# Patient Record
Sex: Male | Born: 1945 | Race: White | Hispanic: No | Smoking: Former smoker
Health system: Southern US, Community
[De-identification: ages and names within clinical notes are randomized; demographics above are authoritative.]

## PROBLEM LIST (undated history)

## (undated) DIAGNOSIS — G4730 Sleep apnea, unspecified: Secondary | ICD-10-CM

## (undated) DIAGNOSIS — J45909 Unspecified asthma, uncomplicated: Secondary | ICD-10-CM

## (undated) DIAGNOSIS — M2550 Pain in unspecified joint: Secondary | ICD-10-CM

## (undated) DIAGNOSIS — E785 Hyperlipidemia, unspecified: Secondary | ICD-10-CM

## (undated) DIAGNOSIS — G4700 Insomnia, unspecified: Secondary | ICD-10-CM

## (undated) DIAGNOSIS — M1990 Unspecified osteoarthritis, unspecified site: Secondary | ICD-10-CM

## (undated) DIAGNOSIS — M109 Gout, unspecified: Secondary | ICD-10-CM

## (undated) DIAGNOSIS — M549 Dorsalgia, unspecified: Secondary | ICD-10-CM

## (undated) DIAGNOSIS — I10 Essential (primary) hypertension: Secondary | ICD-10-CM

## (undated) DIAGNOSIS — Z8489 Family history of other specified conditions: Secondary | ICD-10-CM

## (undated) DIAGNOSIS — G629 Polyneuropathy, unspecified: Secondary | ICD-10-CM

## (undated) DIAGNOSIS — E039 Hypothyroidism, unspecified: Secondary | ICD-10-CM

## (undated) DIAGNOSIS — E079 Disorder of thyroid, unspecified: Secondary | ICD-10-CM

## (undated) HISTORY — DX: Insomnia, unspecified: G47.00

## (undated) HISTORY — DX: Gout, unspecified: M10.9

## (undated) HISTORY — DX: Hyperlipidemia, unspecified: E78.5

## (undated) HISTORY — DX: Unspecified asthma, uncomplicated: J45.909

## (undated) HISTORY — DX: Essential (primary) hypertension: I10

## (undated) HISTORY — DX: Unspecified osteoarthritis, unspecified site: M19.90

## (undated) HISTORY — DX: Disorder of thyroid, unspecified: E07.9

## (undated) HISTORY — PX: OTHER SURGICAL HISTORY: SHX169

## (undated) HISTORY — DX: Pain in unspecified joint: M25.50

## (undated) HISTORY — DX: Dorsalgia, unspecified: M54.9

---

## 2005-07-28 ENCOUNTER — Ambulatory Visit: Payer: Self-pay | Admitting: Cardiology

## 2005-08-02 ENCOUNTER — Ambulatory Visit: Payer: Self-pay | Admitting: Cardiology

## 2012-05-08 ENCOUNTER — Other Ambulatory Visit: Payer: Self-pay | Admitting: *Deleted

## 2013-06-05 ENCOUNTER — Encounter (INDEPENDENT_AMBULATORY_CARE_PROVIDER_SITE_OTHER): Payer: Self-pay

## 2013-06-05 ENCOUNTER — Ambulatory Visit (INDEPENDENT_AMBULATORY_CARE_PROVIDER_SITE_OTHER): Payer: Medicare Other | Admitting: Family Medicine

## 2013-06-05 ENCOUNTER — Encounter: Payer: Self-pay | Admitting: Family Medicine

## 2013-06-05 VITALS — BP 171/99 | HR 83 | Temp 97.5°F | Ht 71.0 in | Wt 223.0 lb

## 2013-06-05 DIAGNOSIS — G4700 Insomnia, unspecified: Secondary | ICD-10-CM | POA: Diagnosis not present

## 2013-06-05 DIAGNOSIS — I10 Essential (primary) hypertension: Secondary | ICD-10-CM | POA: Diagnosis not present

## 2013-06-05 DIAGNOSIS — E785 Hyperlipidemia, unspecified: Secondary | ICD-10-CM

## 2013-06-05 DIAGNOSIS — R350 Frequency of micturition: Secondary | ICD-10-CM | POA: Diagnosis not present

## 2013-06-05 DIAGNOSIS — M25569 Pain in unspecified knee: Secondary | ICD-10-CM

## 2013-06-05 DIAGNOSIS — G2581 Restless legs syndrome: Secondary | ICD-10-CM

## 2013-06-05 DIAGNOSIS — M25562 Pain in left knee: Secondary | ICD-10-CM

## 2013-06-05 LAB — POCT CBC
Granulocyte percent: 84 %G — AB (ref 37–80)
HCT, POC: 46.9 % (ref 43.5–53.7)
Hemoglobin: 16.2 g/dL (ref 14.1–18.1)
Lymph, poc: 1.7 (ref 0.6–3.4)
MCH, POC: 32.6 pg — AB (ref 27–31.2)
MCHC: 34.5 g/dL (ref 31.8–35.4)
MCV: 94.4 fL (ref 80–97)
MPV: 8 fL (ref 0–99.8)
POC Granulocyte: 9.1 — AB (ref 2–6.9)
POC LYMPH PERCENT: 15.3 %L (ref 10–50)
Platelet Count, POC: 264 10*3/uL (ref 142–424)
RBC: 5 M/uL (ref 4.69–6.13)
RDW, POC: 12.9 %
WBC: 10.8 10*3/uL — AB (ref 4.6–10.2)

## 2013-06-05 MED ORDER — CARBIDOPA-LEVODOPA ER 50-200 MG PO TBCR: EXTENDED_RELEASE_TABLET | ORAL | Status: DC

## 2013-06-05 MED ORDER — LISINOPRIL 10 MG PO TABS: 10.0000 mg | ORAL_TABLET | Freq: Every day | ORAL | Status: DC

## 2013-06-05 MED ORDER — TRAZODONE HCL 50 MG PO TABS: ORAL_TABLET | ORAL | Status: DC

## 2013-06-05 NOTE — Addendum Note (Signed)
Addended by: Bernita Buffy on: 06/05/2013 11:38 AM   Modules accepted: Orders

## 2013-06-05 NOTE — Addendum Note (Signed)
Addended by: Bernita Buffy on: 06/05/2013 11:36 AM   Modules accepted: Orders

## 2013-06-05 NOTE — Progress Notes (Signed)
   Subjective:    Patient ID: Andrew Phelps, male    DOB: 01-29-1946, 67 y.o.   MRN: [id number]  HPI  This 67 y.o. male presents for evaluation of CPE and he has hx of  hypertension, RLS, insomnia.  He has not seen a PCP in some time.  Review of Systems No chest pain, SOB, HA, dizziness, vision change, N/V, diarrhea, constipation, dysuria, urinary urgency or frequency, myalgias, arthralgias or rash.     Objective:   Physical Exam Vital signs noted  Well developed well nourished male.  HEENT - Head atraumatic Normocephalic                Eyes - PERRLA, Conjuctiva - clear Sclera- Clear EOMI                Ears - EAC's Wnl TM's Wnl Gross Hearing WNL                Nose - Nares patent                 Throat - oropharanx wnl Respiratory - Lungs CTA bilateral Cardiac - RRR S1 and S2 without murmur GI - Abdomen soft Nontender and bowel sounds active x 4 Extremities - No edema. Neuro - Grossly intact.       Assessment & Plan:  Insomnia - Plan: traZODone (DESYREL) 50 MG tablet, one to two po qhs prn insomnia  RLS (restless legs syndrome) - Plan: carbidopa-levodopa (SINEMET CR) 50-200 MG per tablet  Urinary frequency - Plan: PSA.  Other and unspecified hyperlipidemia - Plan: POCT CBC, CMP14+EGFR, Lipid panel, Thyroid Panel With TSH  Essential hypertension, benign - Plan: POCT CBC, CMP14+EGFR, lisinopril (PRINIVIL,ZESTRIL) 10 MG tablet.  Follow up in 2 weeks.  Deatra Canter FNP

## 2013-06-05 NOTE — Patient Instructions (Signed)

## 2013-06-06 ENCOUNTER — Other Ambulatory Visit: Payer: Self-pay | Admitting: Family Medicine

## 2013-06-06 LAB — CMP14+EGFR
ALT: 16 IU/L (ref 0–44)
AST: 20 IU/L (ref 0–40)
Albumin/Globulin Ratio: 1.3 (ref 1.1–2.5)
Albumin: 4.2 g/dL (ref 3.6–4.8)
Alkaline Phosphatase: 94 IU/L (ref 39–117)
BUN/Creatinine Ratio: 7 — ABNORMAL LOW (ref 10–22)
BUN: 9 mg/dL (ref 8–27)
CO2: 21 mmol/L (ref 18–29)
Calcium: 9.4 mg/dL (ref 8.6–10.2)
Chloride: 97 mmol/L (ref 97–108)
Creatinine, Ser: 1.28 mg/dL — ABNORMAL HIGH (ref 0.76–1.27)
GFR calc Af Amer: 67 mL/min/{1.73_m2} (ref >59)
GFR calc non Af Amer: 58 mL/min/{1.73_m2} — ABNORMAL LOW (ref >59)
Globulin, Total: 3.3 g/dL (ref 1.5–4.5)
Glucose: 93 mg/dL (ref 65–99)
Potassium: 5 mmol/L (ref 3.5–5.2)
Sodium: 137 mmol/L (ref 134–144)
Total Bilirubin: 1.6 mg/dL — ABNORMAL HIGH (ref 0.0–1.2)
Total Protein: 7.5 g/dL (ref 6.0–8.5)

## 2013-06-06 LAB — PSA, TOTAL AND FREE
PSA, Free Pct: 23.3 %
PSA, Free: 0.14 ng/mL
PSA: 0.6 ng/mL (ref 0.0–4.0)

## 2013-06-06 LAB — LIPID PANEL
Chol/HDL Ratio: 3.4 ratio units (ref 0.0–5.0)
Cholesterol, Total: 196 mg/dL (ref 100–199)
HDL: 57 mg/dL (ref >39)
LDL Calculated: 122 mg/dL — ABNORMAL HIGH (ref 0–99)
Triglycerides: 83 mg/dL (ref 0–149)
VLDL Cholesterol Cal: 17 mg/dL (ref 5–40)

## 2013-06-06 LAB — THYROID PANEL WITH TSH
Free Thyroxine Index: 1.3 (ref 1.2–4.9)
T3 Uptake Ratio: 28 % (ref 24–39)
T4, Total: 4.7 ug/dL (ref 4.5–12.0)
TSH: 16.72 u[IU]/mL — ABNORMAL HIGH (ref 0.450–4.500)

## 2013-06-06 MED ORDER — LEVOTHYROXINE SODIUM 50 MCG PO TABS: 50.0000 ug | ORAL_TABLET | Freq: Every day | ORAL | Status: DC

## 2013-06-06 MED ORDER — CIPROFLOXACIN HCL 500 MG PO TABS: 500.0000 mg | ORAL_TABLET | Freq: Two times a day (BID) | ORAL | Status: DC

## 2013-06-19 ENCOUNTER — Telehealth: Payer: Self-pay | Admitting: Family Medicine

## 2013-06-19 NOTE — Telephone Encounter (Signed)
Message copied by Azalee Course on Tue Jun 19, 2013 11:42 AM ------      Message from: Deatra Canter      Created: Wed Jun 06, 2013  4:22 PM       Wbc count is elevated and want him to take cipro for 2 weeks for ua frequency and possible prostatitis.  TSH is elevated and rx sent in for levothyroxine 50 mcg po qd and need to follow up in 6 weeks for repeat TSH ------

## 2013-07-05 ENCOUNTER — Ambulatory Visit (INDEPENDENT_AMBULATORY_CARE_PROVIDER_SITE_OTHER): Payer: Medicare Other | Admitting: Family Medicine

## 2013-07-05 ENCOUNTER — Encounter: Payer: Self-pay | Admitting: Family Medicine

## 2013-07-05 VITALS — BP 120/71 | HR 82 | Temp 98.6°F | Ht 71.0 in | Wt 219.0 lb

## 2013-07-05 DIAGNOSIS — Z23 Encounter for immunization: Secondary | ICD-10-CM | POA: Diagnosis not present

## 2013-07-05 DIAGNOSIS — G2581 Restless legs syndrome: Secondary | ICD-10-CM | POA: Diagnosis not present

## 2013-07-05 DIAGNOSIS — J309 Allergic rhinitis, unspecified: Secondary | ICD-10-CM | POA: Diagnosis not present

## 2013-07-05 DIAGNOSIS — E039 Hypothyroidism, unspecified: Secondary | ICD-10-CM | POA: Diagnosis not present

## 2013-07-05 DIAGNOSIS — J302 Other seasonal allergic rhinitis: Secondary | ICD-10-CM

## 2013-07-05 DIAGNOSIS — I10 Essential (primary) hypertension: Secondary | ICD-10-CM

## 2013-07-05 MED ORDER — LEVOTHYROXINE SODIUM 50 MCG PO TABS: 50.0000 ug | ORAL_TABLET | Freq: Every day | ORAL | Status: DC

## 2013-07-05 MED ORDER — FLUTICASONE PROPIONATE 50 MCG/ACT NA SUSP: 2.0000 | Freq: Every day | NASAL | Status: DC

## 2013-07-05 MED ORDER — CARBIDOPA-LEVODOPA ER 50-200 MG PO TBCR: EXTENDED_RELEASE_TABLET | ORAL | Status: DC

## 2013-07-05 NOTE — Progress Notes (Signed)
   Subjective:    Patient ID: Andrew Phelps, male    DOB: 21-Oct-1945, 68 y.o.   MRN: [id number]  HPI This 68 y.o. male presents for evaluation of follow up. He has HTN and was started on lisinopril. He has RLS and was started on sinemet and this is working well.  He has hypothyroidism and has not started his levothyroxine.  He has been sleeping better on the trazadone.  He is having allergy problems.   Review of Systems No chest pain, SOB, HA, dizziness, vision change, N/V, diarrhea, constipation, dysuria, urinary urgency or frequency, myalgias, arthralgias or rash.     Objective:   Physical Exam Vital signs noted  Well developed well nourished male.  HEENT - Head atraumatic Normocephalic                Eyes - PERRLA, Conjuctiva - clear Sclera- Clear EOMI                Ears - EAC's Wnl TM's Wnl Gross Hearing WNL Respiratory - Lungs CTA bilateral Cardiac - RRR S1 and S2 without murmur GI - Abdomen soft Nontender and bowel sounds active x 4        Assessment & Plan:  Need for prophylactic vaccination against Streptococcus pneumoniae (pneumococcus) - Plan: Pneumococcal conjugate vaccine 13-valent  Need for prophylactic vaccination and inoculation against influenza  RLS (restless legs syndrome) - Plan: carbidopa-levodopa (SINEMET CR) 50-200 MG per tablet  Essential hypertension, benign - Continue lisinopril  Unspecified hypothyroidism - Plan: levothyroxine (SYNTHROID, LEVOTHROID) 50 MCG tablet  Seasonal allergies - Plan: fluticasone (FLONASE) 50 MCG/ACT nasal spray  Follow up in 3 months and recheck Thyroid panel  Lysbeth Penner FNP

## 2013-07-05 NOTE — Patient Instructions (Signed)
Influenza Virus Vaccine injection What is this medicine? INFLUENZA VIRUS VACCINE (in floo EN zuh VAHY ruhs vak SEEN) helps to reduce the risk of getting influenza also known as the flu. The vaccine only helps protect you against some strains of the flu. This medicine may be used for other purposes; ask your health care provider or pharmacist if you have questions. COMMON BRAND NAME(S): Afluria , Agriflu, Fluarix Quadrivalent, Fluarix, FLUCELVAX, Flulaval, Fluvirin, Fluzone High-Dose, Fluzone Intradermal, Fluzone What should I tell my health care provider before I take this medicine? They need to know if you have any of these conditions: -bleeding disorder like hemophilia -fever or infection -Guillain-Barre syndrome or other neurological problems -immune system problems -infection with the human immunodeficiency virus (HIV) or AIDS -low blood platelet counts -multiple sclerosis -an unusual or allergic reaction to influenza virus vaccine, latex, other medicines, foods, dyes, or preservatives. Different brands of vaccines contain different allergens. Some may contain latex or eggs. Talk to your doctor about your allergies to make sure that you get the right vaccine. -pregnant or trying to get pregnant -breast-feeding How should I use this medicine? This vaccine is for injection into a muscle or under the skin. It is given by a health care professional. A copy of Vaccine Information Statements will be given before each vaccination. Read this sheet carefully each time. The sheet may change frequently. Talk to your healthcare provider to see which vaccines are right for you. Some vaccines should not be used in all age groups. Overdosage: If you think you have taken too much of this medicine contact a poison control center or emergency room at once. NOTE: This medicine is only for you. Do not share this medicine with others. What if I miss a dose? This does not apply. What may interact with this  medicine? -chemotherapy or radiation therapy -medicines that lower your immune system like etanercept, anakinra, infliximab, and adalimumab -medicines that treat or prevent blood clots like warfarin -phenytoin -steroid medicines like prednisone or cortisone -theophylline -vaccines This list may not describe all possible interactions. Give your health care provider a list of all the medicines, herbs, non-prescription drugs, or dietary supplements you use. Also tell them if you smoke, drink alcohol, or use illegal drugs. Some items may interact with your medicine. What should I watch for while using this medicine? Report any side effects that do not go away within 3 days to your doctor or health care professional. Call your health care provider if any unusual symptoms occur within 6 weeks of receiving this vaccine. You may still catch the flu, but the illness is not usually as bad. You cannot get the flu from the vaccine. The vaccine will not protect against colds or other illnesses that may cause fever. The vaccine is needed every year. What side effects may I notice from receiving this medicine? Side effects that you should report to your doctor or health care professional as soon as possible: -allergic reactions like skin rash, itching or hives, swelling of the face, lips, or tongue Side effects that usually do not require medical attention (report to your doctor or health care professional if they continue or are bothersome): -fever -headache -muscle aches and pains -pain, tenderness, redness, or swelling at the injection site -tiredness This list may not describe all possible side effects. Call your doctor for medical advice about side effects. You may report side effects to FDA at 1-800-FDA-1088. Where should I keep my medicine? The vaccine will be given by a  health care professional in a clinic, pharmacy, doctor's office, or other health care setting. You will not be given vaccine doses  to store at home. NOTE: This sheet is a summary. It may not cover all possible information. If you have questions about this medicine, talk to your doctor, pharmacist, or health care provider.  2014, Elsevier/Gold Standard. (2011-12-16 13:08:28)  Pneumococcal Vaccine, Polyvalent suspension for injection What is this medicine? PNEUMOCOCCAL VACCINE, POLYVALENT (NEU mo KOK al vak SEEN, pol ee VEY luhnt) is a vaccine to prevent pneumococcus bacteria infection. These bacteria are a major cause of ear infections, 'Strep throat' infections, and serious pneumonia, meningitis, or blood infections worldwide. These vaccines help the body to produce antibodies (protective substances) that help your body defend against these bacteria. This vaccine is recommended for infants and young children. This vaccine will not treat an infection. This medicine may be used for other purposes; ask your health care provider or pharmacist if you have questions. COMMON BRAND NAME(S): Prevnar 13 , Prevnar What should I tell my health care provider before I take this medicine? They need to know if you have any of these conditions: -bleeding problems -fever -immune system problems -low platelet count in the blood -seizures -an unusual or allergic reaction to pneumococcal vaccine, diphtheria toxoid, other vaccines, latex, other medicines, foods, dyes, or preservatives -pregnant or trying to get pregnant -breast-feeding How should I use this medicine? This vaccine is for injection into a muscle. It is given by a health care professional. A copy of Vaccine Information Statements will be given before each vaccination. Read this sheet carefully each time. The sheet may change frequently. Talk to your pediatrician regarding the use of this medicine in children. While this drug may be prescribed for children as young as 19 weeks old for selected conditions, precautions do apply. Overdosage: If you think you have taken too much of  this medicine contact a poison control center or emergency room at once. NOTE: This medicine is only for you. Do not share this medicine with others. What if I miss a dose? It is important not to miss your dose. Call your doctor or health care professional if you are unable to keep an appointment. What may interact with this medicine? -medicines for cancer chemotherapy -medicines that suppress your immune function -medicines that treat or prevent blood clots like warfarin, enoxaparin, and dalteparin -steroid medicines like prednisone or cortisone This list may not describe all possible interactions. Give your health care provider a list of all the medicines, herbs, non-prescription drugs, or dietary supplements you use. Also tell them if you smoke, drink alcohol, or use illegal drugs. Some items may interact with your medicine. What should I watch for while using this medicine? Mild fever and pain should go away in 3 days or less. Report any unusual symptoms to your doctor or health care professional. What side effects may I notice from receiving this medicine? Side effects that you should report to your doctor or health care professional as soon as possible: -allergic reactions like skin rash, itching or hives, swelling of the face, lips, or tongue -breathing problems -confused -fever over 102 degrees F -pain, tingling, numbness in the hands or feet -seizures -unusual bleeding or bruising -unusual muscle weakness Side effects that usually do not require medical attention (report to your doctor or health care professional if they continue or are bothersome): -aches and pains -diarrhea -fever of 102 degrees F or less -headache -irritable -loss of appetite -pain, tender at site  where injected -trouble sleeping This list may not describe all possible side effects. Call your doctor for medical advice about side effects. You may report side effects to FDA at 1-800-FDA-1088. Where should I  keep my medicine? This does not apply. This vaccine is given in a clinic, pharmacy, doctor's office, or other health care setting and will not be stored at home. NOTE: This sheet is a summary. It may not cover all possible information. If you have questions about this medicine, talk to your doctor, pharmacist, or health care provider.  2014, Elsevier/Gold Standard. (2008-08-20 10:17:22)

## 2013-10-11 ENCOUNTER — Encounter: Payer: Self-pay | Admitting: Family Medicine

## 2013-10-11 ENCOUNTER — Ambulatory Visit (INDEPENDENT_AMBULATORY_CARE_PROVIDER_SITE_OTHER): Payer: Medicare Other | Admitting: Family Medicine

## 2013-10-11 VITALS — BP 140/79 | HR 78 | Temp 96.7°F | Ht 71.0 in | Wt 217.4 lb

## 2013-10-11 DIAGNOSIS — E039 Hypothyroidism, unspecified: Secondary | ICD-10-CM

## 2013-10-11 DIAGNOSIS — G4700 Insomnia, unspecified: Secondary | ICD-10-CM | POA: Diagnosis not present

## 2013-10-11 DIAGNOSIS — Z23 Encounter for immunization: Secondary | ICD-10-CM

## 2013-10-11 MED ORDER — LEVOTHYROXINE SODIUM 50 MCG PO TABS: 50.0000 ug | ORAL_TABLET | Freq: Every day | ORAL | Status: DC

## 2013-10-11 NOTE — Progress Notes (Signed)
   Subjective:    Patient ID: Andrew Phelps, male    DOB: 1946-06-19, 68 y.o.   MRN: [id number]  HPI This 68 y.o. male presents for evaluation of RLS which he states is doing a lot better.  He has hypothyroidism but did not get his levothyroxine, he has hx of insomnia, seasonal allergies, and hypertension.   Review of Systems No chest pain, SOB, HA, dizziness, vision change, N/V, diarrhea, constipation, dysuria, urinary urgency or frequency, myalgias, arthralgias or rash.     Objective:   Physical Exam Vital signs noted  Well developed well nourished male.  HEENT - Head atraumatic Normocephalic                Eyes - PERRLA, Conjuctiva - clear Sclera- Clear EOMI                Ears - EAC's Wnl TM's Wnl Gross Hearing WNL                 Throat - oropharanx wnl Respiratory - Lungs CTA bilateral Cardiac - RRR S1 and S2 without murmur GI - Abdomen soft Nontender and bowel sounds active x 4 Extremities - No edema. Neuro - Grossly intact.       Assessment & Plan:  Need for vaccination - Plan: Tdap vaccine greater than or equal to 7yo IM  Unspecified hypothyroidism - Plan: levothyroxine (SYNTHROID, LEVOTHROID) 50 MCG tablet  Insomnia - Continue trazadone  HTN - Continue lisinopril  Follow up in 3 months  Lysbeth Penner FNP

## 2013-11-02 ENCOUNTER — Other Ambulatory Visit: Payer: Self-pay | Admitting: Family Medicine

## 2013-11-05 ENCOUNTER — Other Ambulatory Visit: Payer: Self-pay | Admitting: Family Medicine

## 2013-11-23 ENCOUNTER — Telehealth: Payer: Self-pay

## 2013-11-23 NOTE — Telephone Encounter (Signed)
Opened in error

## 2014-01-10 ENCOUNTER — Ambulatory Visit (INDEPENDENT_AMBULATORY_CARE_PROVIDER_SITE_OTHER): Payer: Medicare Other | Admitting: Family Medicine

## 2014-01-10 ENCOUNTER — Encounter: Payer: Self-pay | Admitting: Family Medicine

## 2014-01-10 VITALS — BP 147/79 | HR 87 | Temp 97.0°F | Ht 71.0 in | Wt 216.2 lb

## 2014-01-10 DIAGNOSIS — J309 Allergic rhinitis, unspecified: Secondary | ICD-10-CM

## 2014-01-10 DIAGNOSIS — J302 Other seasonal allergic rhinitis: Secondary | ICD-10-CM

## 2014-01-10 DIAGNOSIS — M545 Low back pain, unspecified: Secondary | ICD-10-CM | POA: Diagnosis not present

## 2014-01-10 DIAGNOSIS — I10 Essential (primary) hypertension: Secondary | ICD-10-CM | POA: Diagnosis not present

## 2014-01-10 DIAGNOSIS — E039 Hypothyroidism, unspecified: Secondary | ICD-10-CM | POA: Diagnosis not present

## 2014-01-10 DIAGNOSIS — G2581 Restless legs syndrome: Secondary | ICD-10-CM

## 2014-01-10 DIAGNOSIS — J069 Acute upper respiratory infection, unspecified: Secondary | ICD-10-CM

## 2014-01-10 MED ORDER — LISINOPRIL 10 MG PO TABS: 10.0000 mg | ORAL_TABLET | Freq: Every day | ORAL | Status: DC

## 2014-01-10 MED ORDER — HYDROCODONE-ACETAMINOPHEN 5-325 MG PO TABS: 1.0000 | ORAL_TABLET | Freq: Four times a day (QID) | ORAL | Status: DC | PRN

## 2014-01-10 MED ORDER — CARBIDOPA-LEVODOPA ER 50-200 MG PO TBCR: EXTENDED_RELEASE_TABLET | ORAL | Status: DC

## 2014-01-10 MED ORDER — LEVOTHYROXINE SODIUM 50 MCG PO TABS: 50.0000 ug | ORAL_TABLET | Freq: Every day | ORAL | Status: DC

## 2014-01-10 MED ORDER — FLUTICASONE PROPIONATE 50 MCG/ACT NA SUSP: 2.0000 | Freq: Every day | NASAL | Status: DC

## 2014-01-10 MED ORDER — MONTELUKAST SODIUM 10 MG PO TABS: 10.0000 mg | ORAL_TABLET | Freq: Every day | ORAL | Status: DC

## 2014-01-10 MED ORDER — AZITHROMYCIN 250 MG PO TABS: ORAL_TABLET | ORAL | Status: DC

## 2014-01-10 NOTE — Progress Notes (Signed)
   Subjective:    Patient ID: Andrew Phelps, male    DOB: 21-Feb-1946, 68 y.o.   MRN: [id number]  HPI This 68 y.o. male presents for evaluation of routine follow up.  He has allergies and they have been bad recently.  He is having sore throat and uri sx's.  He has hx of RLS, SAR, insomnia, htn, and hypothyroidism.  He is due for labs .   Review of Systems C/o uri sx's   No chest pain, SOB, HA, dizziness, vision change, N/V, diarrhea, constipation, dysuria, urinary urgency or frequency, myalgias, arthralgias or rash.  Objective:   Physical Exam  Vital signs noted  Well developed well nourished male.  HEENT - Head atraumatic Normocephalic                Eyes - PERRLA, Conjuctiva - clear Sclera- Clear EOMI                Ears - EAC's Wnl TM's Wnl Gross Hearing WNL                Nose - Nares patent                 Throat - oropharanx wnl Respiratory - Lungs CTA bilateral Cardiac - RRR S1 and S2 without murmur GI - Abdomen soft Nontender and bowel sounds active x 4 Extremities - No edema. Neuro - Grossly intact. MS - TTP LS paraspinous muscles     Assessment & Plan:  Unspecified hypothyroidism - Plan: Thyroid Panel With TSH, levothyroxine (SYNTHROID, LEVOTHROID) 50 MCG tablet, DISCONTINUED: levothyroxine (SYNTHROID, LEVOTHROID) 50 MCG tablet  Essential hypertension, benign - Plan: POCT CBC, CMP14+EGFR, lisinopril (PRINIVIL,ZESTRIL) 10 MG tablet, DISCONTINUED: lisinopril (PRINIVIL,ZESTRIL) 10 MG tablet  Midline low back pain without sciatica - Plan: HYDROcodone-acetaminophen (NORCO) 5-325 MG per tablet, DISCONTINUED: HYDROcodone-acetaminophen (NORCO) 5-325 MG per tablet  Seasonal allergies - Plan: montelukast (SINGULAIR) 10 MG tablet, fluticasone (FLONASE) 50 MCG/ACT nasal spray  Acute URI - Plan: azithromycin (ZITHROMAX) 250 MG tablet  RLS (restless legs syndrome) - Plan: carbidopa-levodopa (SINEMET CR) 50-200 MG per tablet, DISCONTINUED: carbidopa-levodopa (SINEMET CR)  50-200 MG per tablet  Follow up in 3 months  Lysbeth Penner FNP

## 2014-03-13 ENCOUNTER — Encounter: Payer: Self-pay | Admitting: *Deleted

## 2014-03-21 ENCOUNTER — Encounter: Payer: Self-pay | Admitting: Family Medicine

## 2014-03-21 ENCOUNTER — Ambulatory Visit (INDEPENDENT_AMBULATORY_CARE_PROVIDER_SITE_OTHER): Payer: Medicare Other | Admitting: Family Medicine

## 2014-03-21 VITALS — BP 144/83 | HR 85 | Temp 98.4°F | Ht 71.0 in | Wt 213.0 lb

## 2014-03-21 DIAGNOSIS — E038 Other specified hypothyroidism: Secondary | ICD-10-CM

## 2014-03-21 DIAGNOSIS — G2581 Restless legs syndrome: Secondary | ICD-10-CM

## 2014-03-21 DIAGNOSIS — I10 Essential (primary) hypertension: Secondary | ICD-10-CM | POA: Diagnosis not present

## 2014-03-21 DIAGNOSIS — M545 Low back pain, unspecified: Secondary | ICD-10-CM

## 2014-03-21 DIAGNOSIS — M1990 Unspecified osteoarthritis, unspecified site: Secondary | ICD-10-CM | POA: Diagnosis not present

## 2014-03-21 DIAGNOSIS — G4700 Insomnia, unspecified: Secondary | ICD-10-CM

## 2014-03-21 DIAGNOSIS — M25521 Pain in right elbow: Secondary | ICD-10-CM

## 2014-03-21 DIAGNOSIS — N410 Acute prostatitis: Secondary | ICD-10-CM | POA: Diagnosis not present

## 2014-03-21 LAB — POCT CBC
Granulocyte percent: 81 %G — AB (ref 37–80)
HCT, POC: 41 % — AB (ref 43.5–53.7)
Hemoglobin: 13.8 g/dL — AB (ref 14.1–18.1)
Lymph, poc: 1.4 (ref 0.6–3.4)
MCH, POC: 31.5 pg — AB (ref 27–31.2)
MCHC: 33.7 g/dL (ref 31.8–35.4)
MCV: 93.3 fL (ref 80–97)
MPV: 7.8 fL (ref 0–99.8)
POC Granulocyte: 8.4 — AB (ref 2–6.9)
POC LYMPH PERCENT: 13.3 %L (ref 10–50)
Platelet Count, POC: 239 10*3/uL (ref 142–424)
RBC: 4.4 M/uL — AB (ref 4.69–6.13)
RDW, POC: 12.9 %
WBC: 10.4 10*3/uL — AB (ref 4.6–10.2)

## 2014-03-21 LAB — POCT URINALYSIS DIPSTICK
Bilirubin, UA: NEGATIVE
Blood, UA: NEGATIVE
Glucose, UA: NEGATIVE
Ketones, UA: NEGATIVE
Leukocytes, UA: NEGATIVE
Nitrite, UA: NEGATIVE
Protein, UA: NEGATIVE
Spec Grav, UA: 1.01
Urobilinogen, UA: NEGATIVE
pH, UA: 5

## 2014-03-21 LAB — POCT UA - MICROSCOPIC ONLY
Bacteria, U Microscopic: NEGATIVE
Casts, Ur, LPF, POC: NEGATIVE
Crystals, Ur, HPF, POC: NEGATIVE
Mucus, UA: NEGATIVE
RBC, urine, microscopic: NEGATIVE
Yeast, UA: NEGATIVE

## 2014-03-21 LAB — POCT UA - MICROALBUMIN: Microalbumin Ur, POC: NEGATIVE mg/L

## 2014-03-21 MED ORDER — HYDROCODONE-ACETAMINOPHEN 5-325 MG PO TABS: 1.0000 | ORAL_TABLET | Freq: Four times a day (QID) | ORAL | Status: DC | PRN

## 2014-03-21 MED ORDER — CARBIDOPA-LEVODOPA ER 50-200 MG PO TBCR: EXTENDED_RELEASE_TABLET | ORAL | Status: DC

## 2014-03-21 MED ORDER — LISINOPRIL 10 MG PO TABS: 10.0000 mg | ORAL_TABLET | Freq: Every day | ORAL | Status: DC

## 2014-03-21 MED ORDER — CIPROFLOXACIN HCL 500 MG PO TABS: 500.0000 mg | ORAL_TABLET | Freq: Two times a day (BID) | ORAL | Status: DC

## 2014-03-21 MED ORDER — MELOXICAM 15 MG PO TABS: 15.0000 mg | ORAL_TABLET | Freq: Every day | ORAL | Status: DC

## 2014-03-21 MED ORDER — TRAZODONE HCL 50 MG PO TABS: 50.0000 mg | ORAL_TABLET | Freq: Every evening | ORAL | Status: DC | PRN

## 2014-03-21 NOTE — Progress Notes (Signed)
Subjective:    Patient ID: Andrew Phelps, male    DOB: [birth year], 68 y.o.   MRN: [id number]  HPI This 68 y.o. male presents for evaluation of right elbow pain and discomfort.  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  He has hx of RLS and the sinemet is working to help.  He c/o arthritis pain in his knees, back, and right elbow.  He c/o urinary frequency and right back pain.  He has hx of insomnia, SAR, HTN, arthritis, and hypothyroidism. He needs refills on medicine.   Review of Systems C/o arthritis   No chest pain, SOB, HA, dizziness, vision change, N/V, diarrhea, constipation, dysuria, urinary urgency or frequency, myalgias, arthralgias or rash.  Objective:   Physical Exam  Vital signs noted  Well developed well nourished male.  HEENT - Head atraumatic Normocephalic                Eyes - PERRLA, Conjuctiva - clear Sclera- Clear EOMI                Ears - EAC's Wnl TM's Wnl Gross Hearing WNL                Nose - Nares patent                 Throat - oropharanx wnl Respiratory - Lungs CTA bilateral Cardiac - RRR S1 and S2 without murmur GI - Abdomen soft Nontender and bowel sounds active x 4 Extremities - No edema. Neuro - Grossly intact. MS - right bicep and elbow tenderness, right bicep contracture.     Results for orders placed in visit on 03/21/14  POCT UA - MICROALBUMIN      Result Value Ref Range   Microalbumin Ur, POC negative    POCT UA - MICROSCOPIC ONLY      Result Value Ref Range   WBC, Ur, HPF, POC 5-10     RBC, urine, microscopic negative     Bacteria, U Microscopic negative     Mucus, UA negative     Epithelial cells, urine per micros occ     Crystals, Ur, HPF, POC negative     Casts, Ur, LPF, POC negative     Yeast, UA negative    POCT URINALYSIS DIPSTICK      Result Value Ref Range   Color, UA straw     Clarity, UA clear     Glucose, UA negative     Bilirubin, UA negative     Ketones, UA  negative     Spec Grav, UA 1.010     Blood, UA negative     pH, UA 5.0     Protein, UA negative     Urobilinogen, UA negative     Nitrite, UA negative     Leukocytes, UA Negative     Assessment & Plan:  Right low back pain, with sciatica presence unspecified - Plan: POCT UA - Microalbumin, POCT UA - Microscopic Only, Urine culture, POCT urinalysis dipstick  Midline low back pain without sciatica - Plan: HYDROcodone-acetaminophen (NORCO) 5-325 MG per tablet, POCT urinalysis dipstick  RLS (restless legs syndrome) - Plan: carbidopa-levodopa (SINEMET CR) 50-200 MG per tablet  Essential hypertension, benign - Plan: lisinopril (PRINIVIL,ZESTRIL) 10 MG tablet, POCT CBC, CMP14+EGFR  Arthritis - Meloxicam 13m one po qd  Right elbow pain - Plan: HYDROcodone-acetaminophen (NORCO) 5-325 MG per tablet, meloxicam (MOBIC) 15 MG tablet  Insomnia -  Plan: traZODone (DESYREL) 50 MG tablet  Other specified hypothyroidism - Plan: TSH  Acute prostatitis - Plan: ciprofloxacin (CIPRO) 500 MG tablet po bid x 3 weeks  Follow up prn and in 3 months  Lysbeth Penner FNP

## 2014-03-22 ENCOUNTER — Telehealth: Payer: Self-pay | Admitting: Family Medicine

## 2014-03-22 ENCOUNTER — Other Ambulatory Visit: Payer: Self-pay | Admitting: Family Medicine

## 2014-03-22 LAB — CMP14+EGFR
ALT: 11 IU/L (ref 0–44)
AST: 36 IU/L (ref 0–40)
Albumin/Globulin Ratio: 1.1 (ref 1.1–2.5)
Albumin: 3.9 g/dL (ref 3.6–4.8)
Alkaline Phosphatase: 101 IU/L (ref 39–117)
BUN/Creatinine Ratio: 13 (ref 10–22)
BUN: 25 mg/dL (ref 8–27)
CO2: 21 mmol/L (ref 18–29)
Calcium: 9.3 mg/dL (ref 8.6–10.2)
Chloride: 87 mmol/L — ABNORMAL LOW (ref 97–108)
Creatinine, Ser: 1.9 mg/dL — ABNORMAL HIGH (ref 0.76–1.27)
GFR calc Af Amer: 42 mL/min/{1.73_m2} — ABNORMAL LOW (ref >59)
GFR calc non Af Amer: 36 mL/min/{1.73_m2} — ABNORMAL LOW (ref >59)
Globulin, Total: 3.5 g/dL (ref 1.5–4.5)
Glucose: 91 mg/dL (ref 65–99)
Potassium: 4 mmol/L (ref 3.5–5.2)
Sodium: 129 mmol/L — ABNORMAL LOW (ref 134–144)
Total Bilirubin: 1.5 mg/dL — ABNORMAL HIGH (ref 0.0–1.2)
Total Protein: 7.4 g/dL (ref 6.0–8.5)

## 2014-03-22 LAB — URINE CULTURE: Organism ID, Bacteria: NO GROWTH

## 2014-03-22 LAB — TSH: TSH: 8.81 u[IU]/mL — ABNORMAL HIGH (ref 0.450–4.500)

## 2014-03-22 MED ORDER — LEVOTHYROXINE SODIUM 75 MCG PO TABS: 75.0000 ug | ORAL_TABLET | Freq: Every day | ORAL | Status: DC

## 2014-03-22 NOTE — Telephone Encounter (Signed)
Message copied by Waverly Ferrari on Fri Mar 22, 2014  4:22 PM ------      Message from: Lysbeth Penner      Created: Fri Mar 22, 2014  3:43 PM       TSH elevated and increase levothyroxine to 75 mcg, Kidney fx is up so DC mobic arthritis medicine. ------

## 2014-04-11 ENCOUNTER — Encounter: Payer: Self-pay | Admitting: *Deleted

## 2014-04-11 ENCOUNTER — Telehealth: Payer: Self-pay | Admitting: *Deleted

## 2014-04-11 NOTE — Telephone Encounter (Signed)
Message copied by Shelbie Ammons on Thu Apr 11, 2014 11:25 AM ------      Message from: Lysbeth Penner      Created: Fri Mar 22, 2014  3:43 PM       TSH elevated and increase levothyroxine to 75 mcg, Kidney fx is up so DC mobic arthritis medicine. ------

## 2014-04-11 NOTE — Telephone Encounter (Signed)
Letter mailed

## 2014-05-10 ENCOUNTER — Ambulatory Visit: Payer: Medicare Other | Admitting: Family Medicine

## 2014-05-15 ENCOUNTER — Ambulatory Visit: Payer: Medicare Other | Admitting: Family Medicine

## 2014-07-09 ENCOUNTER — Ambulatory Visit (INDEPENDENT_AMBULATORY_CARE_PROVIDER_SITE_OTHER): Payer: Medicare Other | Admitting: Family Medicine

## 2014-07-09 ENCOUNTER — Encounter: Payer: Self-pay | Admitting: Family Medicine

## 2014-07-09 VITALS — BP 184/102 | HR 78 | Temp 97.3°F | Ht 71.0 in | Wt 213.0 lb

## 2014-07-09 DIAGNOSIS — G4700 Insomnia, unspecified: Secondary | ICD-10-CM

## 2014-07-09 DIAGNOSIS — J302 Other seasonal allergic rhinitis: Secondary | ICD-10-CM

## 2014-07-09 DIAGNOSIS — G2581 Restless legs syndrome: Secondary | ICD-10-CM | POA: Diagnosis not present

## 2014-07-09 DIAGNOSIS — I10 Essential (primary) hypertension: Secondary | ICD-10-CM | POA: Diagnosis not present

## 2014-07-09 DIAGNOSIS — M25521 Pain in right elbow: Secondary | ICD-10-CM | POA: Diagnosis not present

## 2014-07-09 DIAGNOSIS — E038 Other specified hypothyroidism: Secondary | ICD-10-CM

## 2014-07-09 DIAGNOSIS — M545 Low back pain, unspecified: Secondary | ICD-10-CM

## 2014-07-09 MED ORDER — HYDROCODONE-ACETAMINOPHEN 5-325 MG PO TABS: 1.0000 | ORAL_TABLET | Freq: Four times a day (QID) | ORAL | Status: DC | PRN

## 2014-07-09 MED ORDER — LEVOTHYROXINE SODIUM 75 MCG PO TABS: 75.0000 ug | ORAL_TABLET | Freq: Every day | ORAL | Status: DC

## 2014-07-09 MED ORDER — AZITHROMYCIN 250 MG PO TABS: ORAL_TABLET | ORAL | Status: DC

## 2014-07-09 MED ORDER — LISINOPRIL 10 MG PO TABS: 10.0000 mg | ORAL_TABLET | Freq: Every day | ORAL | Status: DC

## 2014-07-09 MED ORDER — MONTELUKAST SODIUM 10 MG PO TABS: 10.0000 mg | ORAL_TABLET | Freq: Every day | ORAL | Status: DC

## 2014-07-09 MED ORDER — MELOXICAM 15 MG PO TABS: 15.0000 mg | ORAL_TABLET | Freq: Every day | ORAL | Status: DC

## 2014-07-09 MED ORDER — CARBIDOPA-LEVODOPA ER 50-200 MG PO TBCR: EXTENDED_RELEASE_TABLET | ORAL | Status: DC

## 2014-07-09 MED ORDER — TRAZODONE HCL 50 MG PO TABS
50.0000 mg | ORAL_TABLET | Freq: Every evening | ORAL | Status: DC | PRN
Start: 2014-07-09 — End: 2014-12-11

## 2014-07-09 NOTE — Progress Notes (Signed)
   Subjective:    Patient ID: Andrew Phelps, male    DOB: [birth year], 69 y.o.   MRN: [id number]  HPI Patient c/o back and neck pain.  He c/o needing refills.  He has been out of his medicine for 3 weeks.  He has HTN, hypothyroidism, insomnia, and SAR.   He Has not been seen in 5 months and is due.  Review of Systems  Constitutional: Negative for fever.  HENT: Negative for ear pain.   Eyes: Negative for discharge.  Respiratory: Negative for cough.   Cardiovascular: Negative for chest pain.  Gastrointestinal: Negative for abdominal distention.  Endocrine: Negative for polyuria.  Genitourinary: Negative for difficulty urinating.  Musculoskeletal: Negative for gait problem and neck pain.  Skin: Negative for color change and rash.  Neurological: Negative for speech difficulty and headaches.  Psychiatric/Behavioral: Negative for agitation.       Objective:    BP 184/102 mmHg  Pulse 78  Temp(Src) 97.3 F (36.3 C) (Oral)  Ht $R'5\' 11"'iq$  (1.803 m)  Wt 213 lb (96.616 kg)  BMI 29.72 kg/m2 Physical Exam  Constitutional: He is oriented to person, place, and time. He appears well-developed and well-nourished.  HENT:  Head: Normocephalic and atraumatic.  Mouth/Throat: Oropharynx is clear and moist.  Eyes: Pupils are equal, round, and reactive to light.  Neck: Normal range of motion. Neck supple.  Cardiovascular: Normal rate and regular rhythm.   No murmur heard. Pulmonary/Chest: Effort normal and breath sounds normal.  Abdominal: Soft. Bowel sounds are normal. There is no tenderness.  Neurological: He is alert and oriented to person, place, and time.  Skin: Skin is warm and dry.  Psychiatric: He has a normal mood and affect.          Assessment & Plan:     ICD-9-CM ICD-10-CM   1. Midline low back pain without sciatica 724.2 M54.5 HYDROcodone-acetaminophen (NORCO) 5-325 MG per tablet  2. Right elbow pain 719.42 M25.521 HYDROcodone-acetaminophen (NORCO) 5-325 MG per tablet      meloxicam (MOBIC) 15 MG tablet  3. Other specified hypothyroidism 244.8 E03.8 TSH  4. Insomnia 780.52 G47.00 traZODone (DESYREL) 50 MG tablet  5. Seasonal allergies 477.9 J30.2 montelukast (SINGULAIR) 10 MG tablet  6. Essential hypertension, benign 401.1 I10 lisinopril (PRINIVIL,ZESTRIL) 10 MG tablet     POCT CBC     BMP8+EGFR  7. RLS (restless legs syndrome) 333.94 G25.81 carbidopa-levodopa (SINEMET CR) 50-200 MG per tablet     Return in about 6 months (around 01/07/2015).  Lysbeth Penner FNP

## 2014-12-11 ENCOUNTER — Encounter: Payer: Self-pay | Admitting: Family

## 2014-12-11 ENCOUNTER — Ambulatory Visit (INDEPENDENT_AMBULATORY_CARE_PROVIDER_SITE_OTHER): Payer: Medicare Other | Admitting: Family

## 2014-12-11 VITALS — BP 139/79 | HR 87 | Temp 98.2°F | Ht 71.0 in | Wt 226.0 lb

## 2014-12-11 DIAGNOSIS — J4520 Mild intermittent asthma, uncomplicated: Secondary | ICD-10-CM | POA: Diagnosis not present

## 2014-12-11 DIAGNOSIS — E785 Hyperlipidemia, unspecified: Secondary | ICD-10-CM | POA: Diagnosis not present

## 2014-12-11 DIAGNOSIS — I10 Essential (primary) hypertension: Secondary | ICD-10-CM

## 2014-12-11 DIAGNOSIS — Z125 Encounter for screening for malignant neoplasm of prostate: Secondary | ICD-10-CM

## 2014-12-11 DIAGNOSIS — Z23 Encounter for immunization: Secondary | ICD-10-CM

## 2014-12-11 DIAGNOSIS — G2581 Restless legs syndrome: Secondary | ICD-10-CM | POA: Diagnosis not present

## 2014-12-11 DIAGNOSIS — E039 Hypothyroidism, unspecified: Secondary | ICD-10-CM | POA: Insufficient documentation

## 2014-12-11 DIAGNOSIS — M25521 Pain in right elbow: Secondary | ICD-10-CM

## 2014-12-11 DIAGNOSIS — J309 Allergic rhinitis, unspecified: Secondary | ICD-10-CM | POA: Diagnosis not present

## 2014-12-11 DIAGNOSIS — J45909 Unspecified asthma, uncomplicated: Secondary | ICD-10-CM | POA: Insufficient documentation

## 2014-12-11 DIAGNOSIS — M545 Low back pain, unspecified: Secondary | ICD-10-CM

## 2014-12-11 DIAGNOSIS — M1990 Unspecified osteoarthritis, unspecified site: Secondary | ICD-10-CM

## 2014-12-11 DIAGNOSIS — J302 Other seasonal allergic rhinitis: Secondary | ICD-10-CM

## 2014-12-11 DIAGNOSIS — G4700 Insomnia, unspecified: Secondary | ICD-10-CM | POA: Diagnosis not present

## 2014-12-11 MED ORDER — FLUTICASONE PROPIONATE 50 MCG/ACT NA SUSP: 2.0000 | Freq: Every day | NASAL | Status: DC

## 2014-12-11 MED ORDER — TRAZODONE HCL 50 MG PO TABS
50.0000 mg | ORAL_TABLET | Freq: Every evening | ORAL | Status: DC | PRN
Start: 2014-12-11 — End: 2016-02-12

## 2014-12-11 MED ORDER — LISINOPRIL 10 MG PO TABS: 10.0000 mg | ORAL_TABLET | Freq: Every day | ORAL | Status: DC

## 2014-12-11 MED ORDER — MELOXICAM 15 MG PO TABS: 15.0000 mg | ORAL_TABLET | Freq: Every day | ORAL | Status: DC

## 2014-12-11 MED ORDER — CARBIDOPA-LEVODOPA ER 50-200 MG PO TBCR: EXTENDED_RELEASE_TABLET | ORAL | Status: DC

## 2014-12-11 MED ORDER — MONTELUKAST SODIUM 10 MG PO TABS: 10.0000 mg | ORAL_TABLET | Freq: Every day | ORAL | Status: DC

## 2014-12-11 MED ORDER — HYDROCODONE-ACETAMINOPHEN 5-325 MG PO TABS: 1.0000 | ORAL_TABLET | Freq: Four times a day (QID) | ORAL | Status: DC | PRN

## 2014-12-11 MED ORDER — LEVOTHYROXINE SODIUM 75 MCG PO TABS: 75.0000 ug | ORAL_TABLET | Freq: Every day | ORAL | Status: DC

## 2014-12-11 NOTE — Progress Notes (Signed)
Subjective:    Patient ID: Andrew Phelps, male    DOB: [birth year], 69 y.o.   MRN: [id number]  Pt presents to the office today for chronic follow up.  Hypertension This is a chronic problem. The current episode started more than 1 year ago. The problem has been resolved since onset. The problem is controlled. Pertinent negatives include no anxiety, headaches, palpitations, peripheral edema or shortness of breath. Risk factors for coronary artery disease include male gender and family history. Past treatments include ACE inhibitors. The current treatment provides moderate improvement. Hypertensive end-organ damage includes a thyroid problem. There is no history of kidney disease, CVA or heart failure.  Thyroid Problem Presents for follow-up visit. Symptoms include hoarse voice. Patient reports no anxiety, depressed mood, diarrhea, dry skin, hair loss, leg swelling or palpitations. The symptoms have been stable. Past treatments include levothyroxine. The treatment provided significant relief. His past medical history is significant for hyperlipidemia. There is no history of diabetes or heart failure.  Hyperlipidemia This is a recurrent problem. The current episode started more than 1 year ago. The problem is uncontrolled. Recent lipid tests were reviewed and are high. Exacerbating diseases include hypothyroidism. He has no history of diabetes. Factors aggravating his hyperlipidemia include fatty foods. Pertinent negatives include no leg pain or shortness of breath. Current antihyperlipidemic treatment includes diet change. The current treatment provides no improvement of lipids. Risk factors for coronary artery disease include family history, hypertension and male sex.  Asthma He complains of frequent throat clearing, hoarse voice and sputum production. There is no cough, difficulty breathing or shortness of breath. This is a chronic problem. The current episode started more than 1 year ago. The  problem occurs 2 to 4 times per day. The problem has been waxing and waning. Associated symptoms include nasal congestion and sneezing. Pertinent negatives include no ear congestion, ear pain or headaches. His symptoms are aggravated by exercise, pollen and strenuous activity. His symptoms are alleviated by leukotriene antagonist. He reports complete improvement on treatment. His past medical history is significant for asthma.      Review of Systems  Constitutional: Negative.   HENT: Positive for hoarse voice and sneezing. Negative for ear pain.   Respiratory: Positive for sputum production. Negative for cough and shortness of breath.   Cardiovascular: Negative.  Negative for palpitations.  Gastrointestinal: Negative.  Negative for diarrhea.  Endocrine: Negative.   Genitourinary: Negative.   Musculoskeletal: Negative.   Neurological: Negative.  Negative for headaches.  Hematological: Negative.   Psychiatric/Behavioral: Negative.  The patient is not nervous/anxious.   All other systems reviewed and are negative.      Objective:   Physical Exam  Constitutional: He is oriented to person, place, and time. He appears well-developed and well-nourished. No distress.  HENT:  Head: Normocephalic.  Right Ear: External ear normal.  Left Ear: External ear normal.  Nose: Nose normal.  Mouth/Throat: Oropharynx is clear and moist.  Eyes: Pupils are equal, round, and reactive to light. Right eye exhibits no discharge. Left eye exhibits no discharge.  Neck: Normal range of motion. Neck supple. No thyromegaly present.  Cardiovascular: Normal rate, regular rhythm, normal heart sounds and intact distal pulses.   No murmur heard. Pulmonary/Chest: Effort normal and breath sounds normal. No respiratory distress. He has no wheezes.  Abdominal: Soft. Bowel sounds are normal. He exhibits no distension. There is no tenderness.  Musculoskeletal: Normal range of motion. He exhibits no edema or tenderness.    Neurological: He is  alert and oriented to person, place, and time. He has normal reflexes. No cranial nerve deficit.  Skin: Skin is warm and dry. No rash noted. No erythema.  Psychiatric: He has a normal mood and affect. His behavior is normal. Judgment and thought content normal.  Vitals reviewed.    BP 139/79 mmHg  Pulse 87  Temp(Src) 98.2 F (36.8 C) (Oral)  Ht _0  (1.803 m)  Wt 226 lb (102.513 kg)  BMI 31.53 kg/m2      Assessment & Plan:  1. RLS (restless legs syndrome) - carbidopa-levodopa (SINEMET CR) 50-200 MG per tablet; One po qhs  Dispense: 90 tablet; Refill: 3 - CMP14+EGFR  2. Seasonal allergies - fluticasone (FLONASE) 50 MCG/ACT nasal spray; Place 2 sprays into both nostrils daily.  Dispense: 16 g; Refill: 6 - montelukast (SINGULAIR) 10 MG tablet; Take 1 tablet (10 mg total) by mouth at bedtime.  Dispense: 90 tablet; Refill: 3 - CMP14+EGFR  3. Midline low back pain without sciatica - HYDROcodone-acetaminophen (NORCO) 5-325 MG per tablet; Take 1 tablet by mouth every 6 (six) hours as needed for moderate pain.  Dispense: 60 tablet; Refill: 0 - CMP14+EGFR  4. Right elbow pain - CMP14+EGFR  5. Essential hypertension, benign - CMP14+EGFR  6. Insomnia - traZODone (DESYREL) 50 MG tablet; Take 1 tablet (50 mg total) by mouth at bedtime as needed for sleep.  Dispense: 60 tablet; Refill: 5 - CMP14+EGFR  7. Essential hypertension - lisinopril (PRINIVIL,ZESTRIL) 10 MG tablet; Take 1 tablet (10 mg total) by mouth daily.  Dispense: 90 tablet; Refill: 3 - CMP14+EGFR  8. Hypothyroidism, unspecified hypothyroidism type  - levothyroxine (SYNTHROID, LEVOTHROID) 75 MCG tablet; Take 1 tablet (75 mcg total) by mouth daily.  Dispense: 90 tablet; Refill: 3 - CMP14+EGFR - Thyroid Panel With TSH  9. Allergic rhinitis, unspecified allergic rhinitis type - montelukast (SINGULAIR) 10 MG tablet; Take 1 tablet (10 mg total) by mouth at bedtime.  Dispense: 90 tablet; Refill: 3 -  CMP14+EGFR  10. Arthritis - HYDROcodone-acetaminophen (NORCO) 5-325 MG per tablet; Take 1 tablet by mouth every 6 (six) hours as needed for moderate pain.  Dispense: 60 tablet; Refill: 0 - meloxicam (MOBIC) 15 MG tablet; Take 1 tablet (15 mg total) by mouth daily.  Dispense: 30 tablet; Refill: 11 - CMP14+EGFR  11. Prostate cancer screening - CMP14+EGFR - PSA Total+%Free (Serial)  12. Hyperlipidemia - Lipid panel  13. Asthma, chronic, mild intermittent, uncomplicated -Continue Singulair daily   Continue all meds Labs pending Health Maintenance reviewed Diet and exercise encouraged RTO 6 months  Evelina Dun, FNP

## 2014-12-11 NOTE — Addendum Note (Signed)
Addended by: Shelbie Ammons on: 12/11/2014 04:14 PM   Modules accepted: Orders

## 2014-12-11 NOTE — Patient Instructions (Signed)

## 2014-12-12 LAB — CMP14+EGFR
A/G RATIO: 1.4 (ref 1.1–2.5)
ALBUMIN: 3.9 g/dL (ref 3.6–4.8)
ALT: 5 IU/L (ref 0–44)
AST: 18 IU/L (ref 0–40)
Alkaline Phosphatase: 92 IU/L (ref 39–117)
BUN / CREAT RATIO: 7 — AB (ref 10–22)
BUN: 12 mg/dL (ref 8–27)
Bilirubin Total: 0.9 mg/dL (ref 0.0–1.2)
CO2: 24 mmol/L (ref 18–29)
CREATININE: 1.63 mg/dL — AB (ref 0.76–1.27)
Calcium: 9.2 mg/dL (ref 8.6–10.2)
Chloride: 95 mmol/L — ABNORMAL LOW (ref 97–108)
GFR calc non Af Amer: 43 mL/min/{1.73_m2} — ABNORMAL LOW (ref >59)
GFR, EST AFRICAN AMERICAN: 50 mL/min/{1.73_m2} — AB (ref >59)
GLOBULIN, TOTAL: 2.8 g/dL (ref 1.5–4.5)
Glucose: 88 mg/dL (ref 65–99)
Potassium: 5.2 mmol/L (ref 3.5–5.2)
SODIUM: 133 mmol/L — AB (ref 134–144)
Total Protein: 6.7 g/dL (ref 6.0–8.5)

## 2014-12-12 LAB — PSA TOTAL+% FREE (SERIAL)
PROSTATE SPECIFIC AG, SERUM: 0.5 ng/mL (ref 0.0–4.0)
PSA, Free Pct: 24 %
PSA, Free: 0.12 ng/mL

## 2014-12-12 LAB — LIPID PANEL
CHOLESTEROL TOTAL: 159 mg/dL (ref 100–199)
Chol/HDL Ratio: 2.9 ratio units (ref 0.0–5.0)
HDL: 55 mg/dL (ref >39)
LDL Calculated: 93 mg/dL (ref 0–99)
TRIGLYCERIDES: 56 mg/dL (ref 0–149)
VLDL Cholesterol Cal: 11 mg/dL (ref 5–40)

## 2014-12-12 LAB — THYROID PANEL WITH TSH
Free Thyroxine Index: 2 (ref 1.2–4.9)
T3 UPTAKE RATIO: 35 % (ref 24–39)
T4 TOTAL: 5.8 ug/dL (ref 4.5–12.0)
TSH: 5.18 u[IU]/mL — ABNORMAL HIGH (ref 0.450–4.500)

## 2014-12-13 ENCOUNTER — Telehealth: Payer: Self-pay | Admitting: *Deleted

## 2014-12-13 ENCOUNTER — Other Ambulatory Visit: Payer: Self-pay | Admitting: Family

## 2014-12-13 DIAGNOSIS — N183 Chronic kidney disease, stage 3 unspecified: Secondary | ICD-10-CM

## 2014-12-13 MED ORDER — LEVOTHYROXINE SODIUM 88 MCG PO TABS: 88.0000 ug | ORAL_TABLET | Freq: Every day | ORAL | Status: DC

## 2014-12-13 NOTE — Telephone Encounter (Signed)
-----   Message from Sharion Balloon, [place] sent at 12/13/2014 10:55 AM EDT ----- Liver function stable, Creatinine elevated- NO NSAID's, pt needs to stop Mobic, nephrologists referral sent Thyroid levels abnormal- Levothyroxine increased to 88 mcg PSA levels WNL Cholesterol levels WNl

## 2014-12-13 NOTE — Telephone Encounter (Signed)
Please call to discuss lab results 

## 2015-03-26 DIAGNOSIS — R809 Proteinuria, unspecified: Secondary | ICD-10-CM | POA: Diagnosis not present

## 2015-03-26 DIAGNOSIS — N183 Chronic kidney disease, stage 3 (moderate): Secondary | ICD-10-CM | POA: Diagnosis not present

## 2015-03-26 DIAGNOSIS — G4730 Sleep apnea, unspecified: Secondary | ICD-10-CM | POA: Diagnosis not present

## 2015-03-26 DIAGNOSIS — I10 Essential (primary) hypertension: Secondary | ICD-10-CM | POA: Diagnosis not present

## 2015-05-07 ENCOUNTER — Ambulatory Visit: Payer: Medicare Other | Admitting: Family Medicine

## 2015-05-07 ENCOUNTER — Ambulatory Visit (INDEPENDENT_AMBULATORY_CARE_PROVIDER_SITE_OTHER): Payer: Medicare Other | Admitting: Family Medicine

## 2015-05-07 VITALS — BP 112/75 | HR 88 | Temp 98.9°F | Ht 71.0 in | Wt 233.0 lb

## 2015-05-07 DIAGNOSIS — M25561 Pain in right knee: Secondary | ICD-10-CM

## 2015-05-07 DIAGNOSIS — Z23 Encounter for immunization: Secondary | ICD-10-CM | POA: Diagnosis not present

## 2015-05-07 MED ORDER — MELOXICAM 15 MG PO TABS: 15.0000 mg | ORAL_TABLET | Freq: Every day | ORAL | Status: DC

## 2015-05-07 MED ORDER — METHYLPREDNISOLONE ACETATE 80 MG/ML IJ SUSP: 40.0000 mg | Freq: Once | INTRAMUSCULAR | Status: DC

## 2015-05-07 MED ORDER — HYDROCODONE-ACETAMINOPHEN 5-325 MG PO TABS: 1.0000 | ORAL_TABLET | Freq: Four times a day (QID) | ORAL | Status: DC | PRN

## 2015-05-07 NOTE — Progress Notes (Signed)
BP 112/75 mmHg  Pulse 88  Temp(Src) 98.9 F (37.2 C) (Oral)  Ht 5' 11" (1.803 m)  Wt 233 lb (105.688 kg)  BMI 32.51 kg/m2   Subjective:    Patient ID: Andrew Phelps, male    DOB: 1945-08-03, 69 y.o.   MRN: [id number]  HPI: Andrew Phelps is a 69 y.o. male presenting on 05/07/2015 for Knee Pain   HPI Right knee pain Patient is been having right knee pain and swelling that is been worsening over the past 2 weeks. He has had this previously and normally he takes his meloxicam but he has not been taking that recently. He denies any fevers or chills or skin color changes. He is having difficulty fully straightening his leg because of the swelling. Most of his pain is on the medial aspect and posterior aspect of his knee. He has difficulty ambulate on that knee on the swollen like this. The pain does not radiate anywhere else and is 7 out of 10.  Relevant past medical, surgical, family and social history reviewed and updated as indicated. Interim medical history since our last visit reviewed. Allergies and medications reviewed and updated.  Review of Systems  Constitutional: Negative for fever.  HENT: Negative for ear discharge and ear pain.   Eyes: Negative for discharge and visual disturbance.  Respiratory: Negative for shortness of breath and wheezing.   Cardiovascular: Negative for chest pain and leg swelling.  Gastrointestinal: Negative for abdominal pain, diarrhea and constipation.  Genitourinary: Negative for difficulty urinating.  Musculoskeletal: Positive for joint swelling, arthralgias and gait problem. Negative for back pain.  Skin: Negative for color change and rash.  Neurological: Negative for syncope, light-headedness and headaches.  All other systems reviewed and are negative.   Per HPI unless specifically indicated above     Medication List       This list is accurate as of: 05/07/15  5:46 PM.  Always use your most recent med list.                 carbidopa-levodopa 50-200 MG tablet  Commonly known as:  SINEMET CR  One po qhs     fluticasone 50 MCG/ACT nasal spray  Commonly known as:  FLONASE  Place 2 sprays into both nostrils daily.     HYDROcodone-acetaminophen 5-325 MG tablet  Commonly known as:  NORCO  Take 1 tablet by mouth every 6 (six) hours as needed for moderate pain.     levothyroxine 88 MCG tablet  Commonly known as:  SYNTHROID, LEVOTHROID  Take 1 tablet (88 mcg total) by mouth daily.     lisinopril 10 MG tablet  Commonly known as:  PRINIVIL,ZESTRIL  Take 1 tablet (10 mg total) by mouth daily.     meloxicam 15 MG tablet  Commonly known as:  MOBIC  Take 1 tablet (15 mg total) by mouth daily.     montelukast 10 MG tablet  Commonly known as:  SINGULAIR  Take 1 tablet (10 mg total) by mouth at bedtime.     traZODone 50 MG tablet  Commonly known as:  DESYREL  Take 1 tablet (50 mg total) by mouth at bedtime as needed for sleep.           Objective:    BP 112/75 mmHg  Pulse 88  Temp(Src) 98.9 F (37.2 C) (Oral)  Ht 5' 11" (1.803 m)  Wt 233 lb (105.688 kg)  BMI 32.51 kg/m2  Wt Readings from Last 3 Encounters:  05/07/15 233 lb (105.688 kg)  12/11/14 226 lb (102.513 kg)  07/09/14 213 lb (96.616 kg)    Physical Exam  Constitutional: He is oriented to person, place, and time. He appears well-developed and well-nourished. No distress.  Eyes: Conjunctivae and EOM are normal. Pupils are equal, round, and reactive to light. Right eye exhibits no discharge. No scleral icterus.  Cardiovascular: Normal rate, regular rhythm, normal heart sounds and intact distal pulses.   No murmur heard. Pulmonary/Chest: Effort normal and breath sounds normal. No respiratory distress. He has no wheezes.  Abdominal: He exhibits no distension.  Musculoskeletal: He exhibits no edema.       Right knee: He exhibits decreased range of motion, swelling, effusion and abnormal meniscus. He exhibits no ecchymosis, no  deformity, no laceration, no erythema, normal alignment, no LCL laxity, normal patellar mobility, no bony tenderness and no MCL laxity. Tenderness found. Medial joint line tenderness noted. No lateral joint line, no LCL and no patellar tendon tenderness noted.  Neurological: He is alert and oriented to person, place, and time. Coordination normal.  Skin: Skin is warm and dry. No rash noted. He is not diaphoretic.  Psychiatric: He has a normal mood and affect. His behavior is normal.  Vitals reviewed.   Results for orders placed or performed in visit on 12/11/14  CMP14+EGFR  Result Value Ref Range   Glucose 88 65 - 99 mg/dL   BUN 12 8 - 27 mg/dL   Creatinine, Ser 1.63 (H) 0.76 - 1.27 mg/dL   GFR calc non Af Amer 43 (L) >59 mL/min/1.73   GFR calc Af Amer 50 (L) >59 mL/min/1.73   BUN/Creatinine Ratio 7 (L) 10 - 22   Sodium 133 (L) 134 - 144 mmol/L   Potassium 5.2 3.5 - 5.2 mmol/L   Chloride 95 (L) 97 - 108 mmol/L   CO2 24 18 - 29 mmol/L   Calcium 9.2 8.6 - 10.2 mg/dL   Total Protein 6.7 6.0 - 8.5 g/dL   Albumin 3.9 3.6 - 4.8 g/dL   Globulin, Total 2.8 1.5 - 4.5 g/dL   Albumin/Globulin Ratio 1.4 1.1 - 2.5   Bilirubin Total 0.9 0.0 - 1.2 mg/dL   Alkaline Phosphatase 92 39 - 117 IU/L   AST 18 0 - 40 IU/L   ALT 5 0 - 44 IU/L  Thyroid Panel With TSH  Result Value Ref Range   TSH 5.180 (H) 0.450 - 4.500 uIU/mL   T4, Total 5.8 4.5 - 12.0 ug/dL   T3 Uptake Ratio 35 24 - 39 %   Free Thyroxine Index 2.0 1.2 - 4.9  PSA Total+%Free (Serial)  Result Value Ref Range   Prostate Specific Ag, Serum 0.5 0.0 - 4.0 ng/mL   PSA, Free 0.12 N/A ng/mL   PSA, Free Pct 24.0 %  Lipid panel  Result Value Ref Range   Cholesterol, Total 159 100 - 199 mg/dL   Triglycerides 56 0 - 149 mg/dL   HDL 55 >39 mg/dL   VLDL Cholesterol Cal 11 5 - 40 mg/dL   LDL Calculated 93 0 - 99 mg/dL   Chol/HDL Ratio 2.9 0.0 - 5.0 ratio units   Knee injection: Risk factors of bleeding and infection discussed with patient  and patient is agreeable towards injection. Patient prepped with Betadine. Lateral approach towards injection used. Injected 40 mg of Depo-Medrol and 1 mL of 2% lidocaine. Patient tolerated procedure well and no side effects from noted. Minimal to no bleeding. Simple bandage applied after.  Assessment & Plan:   Problem List Items Addressed This Visit    None    Visit Diagnoses    Knee pain, acute, right    -  Primary    concern for meniscal tear, steroid injection and meloxicam    Relevant Medications    meloxicam (MOBIC) 15 MG tablet    HYDROcodone-acetaminophen (NORCO) 5-325 MG tablet    methylPREDNISolone acetate (DEPO-MEDROL) injection 40 mg    Other Relevant Orders    DG Knee 1-2 Views Right        Follow up plan: Return if symptoms worsen or fail to improve.  Counseling provided for all of the vaccine components No orders of the defined types were placed in this encounter.    Caryl Pina, MD [facility] 05/07/2015, 5:46 PM

## 2015-06-12 ENCOUNTER — Ambulatory Visit: Payer: Medicare Other | Admitting: Family

## 2015-06-13 ENCOUNTER — Encounter: Payer: Self-pay | Admitting: Family Medicine

## 2015-07-04 ENCOUNTER — Encounter: Payer: Self-pay | Admitting: Family

## 2015-07-04 ENCOUNTER — Ambulatory Visit (INDEPENDENT_AMBULATORY_CARE_PROVIDER_SITE_OTHER): Payer: Medicare Other | Admitting: Family

## 2015-07-04 VITALS — BP 138/87 | HR 78 | Temp 97.0°F | Ht 71.0 in | Wt 228.0 lb

## 2015-07-04 DIAGNOSIS — M1990 Unspecified osteoarthritis, unspecified site: Secondary | ICD-10-CM

## 2015-07-04 DIAGNOSIS — J309 Allergic rhinitis, unspecified: Secondary | ICD-10-CM

## 2015-07-04 DIAGNOSIS — Z1159 Encounter for screening for other viral diseases: Secondary | ICD-10-CM | POA: Diagnosis not present

## 2015-07-04 DIAGNOSIS — I10 Essential (primary) hypertension: Secondary | ICD-10-CM | POA: Diagnosis not present

## 2015-07-04 DIAGNOSIS — Z1211 Encounter for screening for malignant neoplasm of colon: Secondary | ICD-10-CM

## 2015-07-04 DIAGNOSIS — J4520 Mild intermittent asthma, uncomplicated: Secondary | ICD-10-CM

## 2015-07-04 DIAGNOSIS — M25561 Pain in right knee: Secondary | ICD-10-CM | POA: Diagnosis not present

## 2015-07-04 DIAGNOSIS — E039 Hypothyroidism, unspecified: Secondary | ICD-10-CM | POA: Diagnosis not present

## 2015-07-04 DIAGNOSIS — G4700 Insomnia, unspecified: Secondary | ICD-10-CM

## 2015-07-04 DIAGNOSIS — E785 Hyperlipidemia, unspecified: Secondary | ICD-10-CM | POA: Diagnosis not present

## 2015-07-04 MED ORDER — HYDROCODONE-ACETAMINOPHEN 5-325 MG PO TABS: 1.0000 | ORAL_TABLET | Freq: Four times a day (QID) | ORAL | Status: DC | PRN

## 2015-07-04 NOTE — Patient Instructions (Signed)

## 2015-07-04 NOTE — Progress Notes (Signed)
Subjective:    Patient ID: Andrew Phelps, male    DOB: August 12, 1945, 70 y.o.   MRN: [id number]  Pt presents to the office today for chronic follow up.  Hypertension This is a chronic problem. The current episode started more than 1 year ago. The problem has been resolved since onset. The problem is controlled. Pertinent negatives include no anxiety, headaches, palpitations or peripheral edema. Risk factors for coronary artery disease include male gender and family history. Past treatments include ACE inhibitors. The current treatment provides moderate improvement. Hypertensive end-organ damage includes a thyroid problem. There is no history of kidney disease, CVA or heart failure.  Thyroid Problem Presents for follow-up visit. Patient reports no anxiety, depressed mood, diarrhea, dry skin, hair loss, leg swelling or palpitations. The symptoms have been stable. Past treatments include levothyroxine. The treatment provided significant relief. There is no history of heart failure.  Asthma He complains of frequent throat clearing. There is no cough, difficulty breathing or wheezing. This is a chronic problem. The current episode started more than 1 year ago. The problem occurs 2 to 4 times per day. The problem has been waxing and waning. Associated symptoms include nasal congestion. Pertinent negatives include no ear congestion, ear pain or headaches. His symptoms are aggravated by exercise, pollen and strenuous activity. His symptoms are alleviated by leukotriene antagonist. He reports complete improvement on treatment. His past medical history is significant for asthma.      Review of Systems  Constitutional: Negative.   HENT: Negative.  Negative for ear pain.   Respiratory: Negative for cough and wheezing.   Cardiovascular: Negative.  Negative for palpitations.  Gastrointestinal: Negative.  Negative for diarrhea.  Endocrine: Negative.   Genitourinary: Negative.   Musculoskeletal:  Negative.   Neurological: Negative.  Negative for headaches.  Hematological: Negative.   Psychiatric/Behavioral: Negative.  The patient is not nervous/anxious.   All other systems reviewed and are negative.      Objective:   Physical Exam  Constitutional: He is oriented to person, place, and time. He appears well-developed and well-nourished. No distress.  HENT:  Head: Normocephalic.  Right Ear: External ear normal.  Left Ear: External ear normal.  Nose: Nose normal.  Mouth/Throat: Oropharynx is clear and moist.  Eyes: Pupils are equal, round, and reactive to light. Right eye exhibits no discharge. Left eye exhibits no discharge.  Neck: Normal range of motion. Neck supple. No thyromegaly present.  Cardiovascular: Normal rate, regular rhythm, normal heart sounds and intact distal pulses.   No murmur heard. Pulmonary/Chest: Effort normal and breath sounds normal. No respiratory distress. He has no wheezes.  Abdominal: Soft. Bowel sounds are normal. He exhibits no distension. There is no tenderness.  Musculoskeletal: Normal range of motion. He exhibits no edema or tenderness.  Neurological: He is alert and oriented to person, place, and time. He has normal reflexes. No cranial nerve deficit.  Skin: Skin is warm and dry. No rash noted. No erythema.  Psychiatric: He has a normal mood and affect. His behavior is normal. Judgment and thought content normal.  Vitals reviewed.     BP 138/87 mmHg  Pulse 78  Temp(Src) 97 F (36.1 C) (Oral)  Ht _0  (1.803 m)  Wt 228 lb (103.42 kg)  BMI 31.81 kg/m2     Assessment & Plan:  1. Essential hypertension - CMP14+EGFR  2. Asthma, chronic, mild intermittent, uncomplicated - JOA41+YSAY  3. Hypothyroidism, unspecified hypothyroidism type  - CMP14+EGFR - Thyroid Panel With TSH  4.  Allergic rhinitis, unspecified allergic rhinitis type - CMP14+EGFR  5. Arthritis  - CMP14+EGFR - HYDROcodone-acetaminophen (NORCO) 5-325 MG tablet;  Take 1 tablet by mouth every 6 (six) hours as needed for moderate pain.  Dispense: 60 tablet; Refill: 0  6. Insomnia - CMP14+EGFR  7. Hyperlipidemia - CMP14+EGFR - Lipid panel  8. Need for hepatitis C screening test  - CMP14+EGFR - Hepatitis C antibody  9. Colon cancer screening - CMP14+EGFR - Fecal occult blood, imunochemical; Future  10. Knee pain, acute, right - HYDROcodone-acetaminophen (NORCO) 5-325 MG tablet; Take 1 tablet by mouth every 6 (six) hours as needed for moderate pain.  Dispense: 60 tablet; Refill: 0   Continue all meds Labs pending Health Maintenance reviewed Diet and exercise encouraged RTO 6 months  Evelina Dun, FNP

## 2015-07-04 NOTE — Addendum Note (Signed)
Addended by: Earlene Plater on: 07/04/2015 11:32 AM   Modules accepted: Miquel Dunn

## 2015-07-05 LAB — CMP14+EGFR
A/G RATIO: 1.3 (ref 1.1–2.5)
ALT: 6 IU/L (ref 0–44)
AST: 17 IU/L (ref 0–40)
Albumin: 4.2 g/dL (ref 3.6–4.8)
Alkaline Phosphatase: 79 IU/L (ref 39–117)
BUN/Creatinine Ratio: 13 (ref 10–22)
BUN: 20 mg/dL (ref 8–27)
Bilirubin Total: 1.2 mg/dL (ref 0.0–1.2)
CALCIUM: 9.5 mg/dL (ref 8.6–10.2)
CO2: 21 mmol/L (ref 18–29)
CREATININE: 1.56 mg/dL — AB (ref 0.76–1.27)
Chloride: 98 mmol/L (ref 96–106)
GFR, EST AFRICAN AMERICAN: 52 mL/min/{1.73_m2} — AB (ref >59)
GFR, EST NON AFRICAN AMERICAN: 45 mL/min/{1.73_m2} — AB (ref >59)
Globulin, Total: 3.2 g/dL (ref 1.5–4.5)
Glucose: 95 mg/dL (ref 65–99)
POTASSIUM: 4.7 mmol/L (ref 3.5–5.2)
Sodium: 136 mmol/L (ref 134–144)
TOTAL PROTEIN: 7.4 g/dL (ref 6.0–8.5)

## 2015-07-05 LAB — LIPID PANEL
CHOL/HDL RATIO: 3.7 ratio (ref 0.0–5.0)
Cholesterol, Total: 195 mg/dL (ref 100–199)
HDL: 53 mg/dL (ref >39)
LDL CALC: 121 mg/dL — AB (ref 0–99)
Triglycerides: 105 mg/dL (ref 0–149)
VLDL CHOLESTEROL CAL: 21 mg/dL (ref 5–40)

## 2015-07-05 LAB — THYROID PANEL WITH TSH
Free Thyroxine Index: 3.2 (ref 1.2–4.9)
T3 Uptake Ratio: 32 % (ref 24–39)
T4, Total: 10 ug/dL (ref 4.5–12.0)
TSH: 1.81 u[IU]/mL (ref 0.450–4.500)

## 2015-07-05 LAB — HEPATITIS C ANTIBODY: Hep C Virus Ab: <0.1 s/co ratio (ref 0.0–0.9)

## 2015-07-09 ENCOUNTER — Other Ambulatory Visit: Payer: Self-pay | Admitting: Family

## 2015-08-05 DIAGNOSIS — H538 Other visual disturbances: Secondary | ICD-10-CM | POA: Diagnosis not present

## 2015-08-05 DIAGNOSIS — H2513 Age-related nuclear cataract, bilateral: Secondary | ICD-10-CM | POA: Diagnosis not present

## 2015-10-10 ENCOUNTER — Other Ambulatory Visit: Payer: Self-pay | Admitting: *Deleted

## 2015-10-10 DIAGNOSIS — J309 Allergic rhinitis, unspecified: Secondary | ICD-10-CM

## 2015-10-10 DIAGNOSIS — I10 Essential (primary) hypertension: Secondary | ICD-10-CM

## 2015-10-10 DIAGNOSIS — J302 Other seasonal allergic rhinitis: Secondary | ICD-10-CM

## 2015-10-10 MED ORDER — LEVOTHYROXINE SODIUM 88 MCG PO TABS: 88.0000 ug | ORAL_TABLET | Freq: Every day | ORAL | Status: DC

## 2015-10-10 MED ORDER — MONTELUKAST SODIUM 10 MG PO TABS: 10.0000 mg | ORAL_TABLET | Freq: Every day | ORAL | Status: DC

## 2015-10-10 MED ORDER — LISINOPRIL 10 MG PO TABS: 10.0000 mg | ORAL_TABLET | Freq: Every day | ORAL | Status: DC

## 2015-12-19 ENCOUNTER — Other Ambulatory Visit: Payer: Self-pay | Admitting: *Deleted

## 2015-12-19 DIAGNOSIS — J302 Other seasonal allergic rhinitis: Secondary | ICD-10-CM

## 2015-12-19 MED ORDER — FLUTICASONE PROPIONATE 50 MCG/ACT NA SUSP: 2.0000 | Freq: Every day | NASAL | Status: DC

## 2016-01-02 ENCOUNTER — Ambulatory Visit (INDEPENDENT_AMBULATORY_CARE_PROVIDER_SITE_OTHER): Payer: Medicare Other | Admitting: Family

## 2016-01-02 ENCOUNTER — Encounter: Payer: Self-pay | Admitting: Family

## 2016-01-02 VITALS — BP 117/72 | HR 64 | Temp 97.2°F | Ht 71.0 in | Wt 240.8 lb

## 2016-01-02 DIAGNOSIS — J302 Other seasonal allergic rhinitis: Secondary | ICD-10-CM

## 2016-01-02 DIAGNOSIS — E669 Obesity, unspecified: Secondary | ICD-10-CM | POA: Diagnosis not present

## 2016-01-02 DIAGNOSIS — M1990 Unspecified osteoarthritis, unspecified site: Secondary | ICD-10-CM | POA: Diagnosis not present

## 2016-01-02 DIAGNOSIS — G8929 Other chronic pain: Secondary | ICD-10-CM

## 2016-01-02 DIAGNOSIS — E039 Hypothyroidism, unspecified: Secondary | ICD-10-CM

## 2016-01-02 DIAGNOSIS — I10 Essential (primary) hypertension: Secondary | ICD-10-CM

## 2016-01-02 DIAGNOSIS — G6289 Other specified polyneuropathies: Secondary | ICD-10-CM | POA: Diagnosis not present

## 2016-01-02 DIAGNOSIS — J309 Allergic rhinitis, unspecified: Secondary | ICD-10-CM | POA: Diagnosis not present

## 2016-01-02 DIAGNOSIS — J4520 Mild intermittent asthma, uncomplicated: Secondary | ICD-10-CM

## 2016-01-02 DIAGNOSIS — M549 Dorsalgia, unspecified: Secondary | ICD-10-CM | POA: Diagnosis not present

## 2016-01-02 DIAGNOSIS — G4700 Insomnia, unspecified: Secondary | ICD-10-CM | POA: Diagnosis not present

## 2016-01-02 DIAGNOSIS — E785 Hyperlipidemia, unspecified: Secondary | ICD-10-CM

## 2016-01-02 DIAGNOSIS — G629 Polyneuropathy, unspecified: Secondary | ICD-10-CM | POA: Insufficient documentation

## 2016-01-02 MED ORDER — GABAPENTIN 300 MG PO CAPS: 300.0000 mg | ORAL_CAPSULE | Freq: Three times a day (TID) | ORAL | Status: DC

## 2016-01-02 MED ORDER — MONTELUKAST SODIUM 10 MG PO TABS: 10.0000 mg | ORAL_TABLET | Freq: Every day | ORAL | Status: DC

## 2016-01-02 MED ORDER — LORATADINE 10 MG PO TABS: 10.0000 mg | ORAL_TABLET | Freq: Every day | ORAL | Status: DC

## 2016-01-02 MED ORDER — HYDROCODONE-ACETAMINOPHEN 5-325 MG PO TABS: 1.0000 | ORAL_TABLET | Freq: Four times a day (QID) | ORAL | Status: DC | PRN

## 2016-01-02 NOTE — Patient Instructions (Signed)

## 2016-01-02 NOTE — Progress Notes (Signed)
Subjective:    Patient ID: Andrew Phelps, male    DOB: September 07, 1945, 70 y.o.   MRN: [id number]  Pt presents to the office today for chronic follow up.  Hypertension This is a chronic problem. The current episode started more than 1 year ago. The problem has been resolved since onset. The problem is controlled. Pertinent negatives include no anxiety, headaches, palpitations or peripheral edema. Risk factors for coronary artery disease include male gender and family history. Past treatments include ACE inhibitors. The current treatment provides moderate improvement. Hypertensive end-organ damage includes a thyroid problem. There is no history of kidney disease, CVA or heart failure.  Thyroid Problem Presents for follow-up visit. Symptoms include hoarse voice. Patient reports no anxiety, depressed mood, diarrhea, dry skin, hair loss, leg swelling or palpitations. The symptoms have been stable. Past treatments include levothyroxine. The treatment provided significant relief. His past medical history is significant for hyperlipidemia. There is no history of heart failure.  Asthma He complains of frequent throat clearing and hoarse voice. There is no cough, difficulty breathing or wheezing. This is a chronic problem. The current episode started more than 1 year ago. The problem occurs 2 to 4 times per day. The problem has been waxing and waning. Associated symptoms include nasal congestion. Pertinent negatives include no ear congestion, ear pain, headaches, rhinorrhea or sore throat. His symptoms are aggravated by exercise, pollen and strenuous activity. His symptoms are alleviated by leukotriene antagonist. He reports complete improvement on treatment. His past medical history is significant for asthma.  Hyperlipidemia This is a chronic problem. The current episode started more than 1 year ago. The problem is uncontrolled. Recent lipid tests were reviewed and are high. Current antihyperlipidemic  treatment includes diet change. The current treatment provides mild improvement of lipids. Risk factors for coronary artery disease include dyslipidemia, male sex, hypertension, obesity and a sedentary lifestyle.  Arthritis Presents for follow-up visit. He complains of pain. Affected locations include the left MCP, right MCP, right knee and left knee. His pain is at a severity of 5/10. Pertinent negatives include no diarrhea, pain at night or pain while resting. His pertinent risk factors include overuse. Past treatments include rest and an opioid. The treatment provided significant relief.  Insomnia Primary symptoms: no sleep disturbance, no difficulty falling asleep, no somnolence.  The current episode started more than one year. The onset quality is gradual. The problem occurs intermittently. Past treatments include medication. The treatment provided significant relief. PMH includes: hypertension.  Back Pain This is a chronic problem. The current episode started more than 1 year ago. The problem occurs intermittently. The problem has been waxing and waning since onset. The pain is present in the lumbar spine. The quality of the pain is described as aching. The pain is at a severity of 6/10. The pain is moderate. Pertinent negatives include no bladder incontinence or headaches. Risk factors include obesity. He has tried NSAIDs and analgesics for the symptoms. The treatment provided moderate relief.  Peripheral Neuropathy Pt states he has burning, stinging pain in bilateral feet that started years ago. Pt state it is unchanged. Pt currently not taking any medication at this time, but would like to start.     Review of Systems  Constitutional: Negative.   HENT: Positive for hoarse voice. Negative for ear pain, rhinorrhea and sore throat.   Respiratory: Negative.  Negative for cough and wheezing.   Cardiovascular: Negative.  Negative for palpitations.  Gastrointestinal: Negative.  Negative for  diarrhea.  Endocrine: Negative.   Genitourinary: Negative.  Negative for bladder incontinence.  Musculoskeletal: Positive for back pain and arthritis.  Neurological: Negative.  Negative for headaches.  Hematological: Negative.   Psychiatric/Behavioral: Negative for sleep disturbance. The patient has insomnia. The patient is not nervous/anxious.   All other systems reviewed and are negative.      Objective:   Physical Exam  Constitutional: He is oriented to person, place, and time. He appears well-developed and well-nourished. No distress.  HENT:  Head: Normocephalic.  Right Ear: External ear normal.  Left Ear: External ear normal.  Nose: Nose normal.  Mouth/Throat: Oropharynx is clear and moist.  Eyes: Pupils are equal, round, and reactive to light. Right eye exhibits no discharge. Left eye exhibits no discharge.  Neck: Normal range of motion. Neck supple. No thyromegaly present.  Cardiovascular: Normal rate, regular rhythm, normal heart sounds and intact distal pulses.   No murmur heard. Pulmonary/Chest: Effort normal and breath sounds normal. No respiratory distress. He has no wheezes.  Abdominal: Soft. Bowel sounds are normal. He exhibits no distension. There is no tenderness.  Musculoskeletal: Normal range of motion. He exhibits no edema or tenderness.  Neurological: He is alert and oriented to person, place, and time. He has normal reflexes. No cranial nerve deficit.  Skin: Skin is warm and dry. No rash noted. No erythema.  Psychiatric: He has a normal mood and affect. His behavior is normal. Judgment and thought content normal.  Vitals reviewed.     BP 117/72 mmHg  Pulse 64  Temp(Src) 97.2 F (36.2 C) (Oral)  Ht '5\' 11"'$  (1.803 m)  Wt 240 lb 12.8 oz (109.226 kg)  BMI 33.60 kg/m2     Assessment & Plan:  1. Hypothyroidism, unspecified hypothyroidism type - CMP14+EGFR - Thyroid Panel With TSH  2. Arthritis - HYDROcodone-acetaminophen (NORCO) 5-325 MG tablet; Take 1  tablet by mouth every 6 (six) hours as needed for moderate pain.  Dispense: 30 tablet; Refill: 0 - CMP14+EGFR  3. Insomnia - CMP14+EGFR  4. Hyperlipidemia - CMP14+EGFR - Lipid panel  5. Asthma, chronic, mild intermittent, uncomplicated - CBU38+GTXM  6. Essential hypertension - CMP14+EGFR  7. Allergic rhinitis, unspecified allergic rhinitis type -PT told to start Claritin - montelukast (SINGULAIR) 10 MG tablet; Take 1 tablet (10 mg total) by mouth at bedtime.  Dispense: 90 tablet; Refill: 2 - CMP14+EGFR  8. Obesity (BMI 30-39.9) - CMP14+EGFR  9. Seasonal allergies - montelukast (SINGULAIR) 10 MG tablet; Take 1 tablet (10 mg total) by mouth at bedtime.  Dispense: 90 tablet; Refill: 2 - loratadine (CLARITIN) 10 MG tablet; Take 1 tablet (10 mg total) by mouth daily.  Dispense: 30 tablet; Refill: 11 - CMP14+EGFR  10. Chronic back pain - HYDROcodone-acetaminophen (NORCO) 5-325 MG tablet; Take 1 tablet by mouth every 6 (six) hours as needed for moderate pain.  Dispense: 30 tablet; Refill: 0 - CMP14+EGFR  11. Other polyneuropathy ([place]) -Pt started on gabapentin today Start 300 mg once today, then tomorrow 300 mg BID, then the next day 300 mg TID - CMP14+EGFR - gabapentin (NEURONTIN) 300 MG capsule; Take 1 capsule (300 mg total) by mouth 3 (three) times daily.  Dispense: 90 capsule; Refill: 3   Continue all meds Labs pending Health Maintenance reviewed Diet and exercise encouraged RTO 3-4 weeks for pain contract appt  Evelina Dun, FNP

## 2016-01-03 LAB — CMP14+EGFR
ALBUMIN: 4 g/dL (ref 3.6–4.8)
ALK PHOS: 90 IU/L (ref 39–117)
ALT: 9 IU/L (ref 0–44)
AST: 23 IU/L (ref 0–40)
Albumin/Globulin Ratio: 1.3 (ref 1.2–2.2)
BILIRUBIN TOTAL: 1.2 mg/dL (ref 0.0–1.2)
BUN / CREAT RATIO: 13 (ref 10–24)
BUN: 22 mg/dL (ref 8–27)
CHLORIDE: 99 mmol/L (ref 96–106)
CO2: 21 mmol/L (ref 18–29)
CREATININE: 1.76 mg/dL — AB (ref 0.76–1.27)
Calcium: 9.3 mg/dL (ref 8.6–10.2)
GFR calc non Af Amer: 39 mL/min/{1.73_m2} — ABNORMAL LOW (ref >59)
GFR, EST AFRICAN AMERICAN: 45 mL/min/{1.73_m2} — AB (ref >59)
GLOBULIN, TOTAL: 3.1 g/dL (ref 1.5–4.5)
GLUCOSE: 104 mg/dL — AB (ref 65–99)
Potassium: 5.6 mmol/L — ABNORMAL HIGH (ref 3.5–5.2)
SODIUM: 134 mmol/L (ref 134–144)
TOTAL PROTEIN: 7.1 g/dL (ref 6.0–8.5)

## 2016-01-03 LAB — LIPID PANEL
CHOLESTEROL TOTAL: 185 mg/dL (ref 100–199)
Chol/HDL Ratio: 3.6 ratio units (ref 0.0–5.0)
HDL: 51 mg/dL (ref >39)
LDL CALC: 117 mg/dL — AB (ref 0–99)
Triglycerides: 86 mg/dL (ref 0–149)
VLDL CHOLESTEROL CAL: 17 mg/dL (ref 5–40)

## 2016-01-03 LAB — THYROID PANEL WITH TSH
Free Thyroxine Index: 2.2 (ref 1.2–4.9)
T3 Uptake Ratio: 33 % (ref 24–39)
T4 TOTAL: 6.6 ug/dL (ref 4.5–12.0)
TSH: 4.32 u[IU]/mL (ref 0.450–4.500)

## 2016-01-06 ENCOUNTER — Other Ambulatory Visit: Payer: Self-pay | Admitting: Family

## 2016-01-06 DIAGNOSIS — N189 Chronic kidney disease, unspecified: Secondary | ICD-10-CM

## 2016-02-02 ENCOUNTER — Ambulatory Visit: Payer: Medicare Other | Admitting: Family

## 2016-02-06 ENCOUNTER — Ambulatory Visit (INDEPENDENT_AMBULATORY_CARE_PROVIDER_SITE_OTHER): Payer: Medicare Other | Admitting: Family

## 2016-02-06 ENCOUNTER — Encounter: Payer: Self-pay | Admitting: Family

## 2016-02-06 VITALS — BP 125/75 | HR 72 | Temp 98.2°F | Ht 71.0 in | Wt 239.5 lb

## 2016-02-06 DIAGNOSIS — F1120 Opioid dependence, uncomplicated: Secondary | ICD-10-CM | POA: Diagnosis not present

## 2016-02-06 DIAGNOSIS — M549 Dorsalgia, unspecified: Secondary | ICD-10-CM

## 2016-02-06 DIAGNOSIS — M1990 Unspecified osteoarthritis, unspecified site: Secondary | ICD-10-CM

## 2016-02-06 DIAGNOSIS — G8929 Other chronic pain: Secondary | ICD-10-CM | POA: Diagnosis not present

## 2016-02-06 DIAGNOSIS — Z79899 Other long term (current) drug therapy: Secondary | ICD-10-CM | POA: Diagnosis not present

## 2016-02-06 DIAGNOSIS — Z0289 Encounter for other administrative examinations: Secondary | ICD-10-CM

## 2016-02-06 MED ORDER — HYDROCODONE-ACETAMINOPHEN 5-325 MG PO TABS: 1.0000 | ORAL_TABLET | Freq: Four times a day (QID) | ORAL | 0 refills | Status: DC | PRN

## 2016-02-06 NOTE — Patient Instructions (Signed)

## 2016-02-06 NOTE — Progress Notes (Signed)
[place] Controlled Substance Abuse database reviewed- Yes If yes- were their any concerning findings : None, Pt has only received pain medication from me Depression screen [facility] 2/9 02/06/2016 01/02/2016 07/04/2015 12/11/2014 07/09/2014  Decreased Interest 0 0 0 0 0  Down, Depressed, Hopeless 0 0 0 0 0  PHQ - 2 Score 0 0 0 0 0    GAD 7 : Generalized Anxiety Score 02/06/2016  Nervous, Anxious, on Edge 0  Control/stop worrying 0  Worry too much - different things 0  Trouble relaxing 0  Restless 0  Easily annoyed or irritable 0  Afraid - awful might happen 0  Total GAD 7 Score 0       [place] drug screen performed- Yes  SOAPP  0= never  1= seldom  2=sometimes  3= often  4= very often  How often do you have mood swings? 0 How often do you smoke a cigarette within an hour after waling up? 0 How often have you taken medication other than the way that it was prescribed?0 How often have you used illegal drugs in the past 5 years? 0 How often, in your lifetime, have you had legal problems or been arrested? 0  Score 0  Alcohol Audit - How often during the last year have found that you: 0-Never   1- Less than monthly   2- Monthly     3-Weekly     4-daily or almost daily  - found that you were not able to stop drinking once you started- 0 -failed to do what was normally expected of you because of drinking- 1 -needed a first drink in the morning- 0 -had a feeling of guilt or remorse after drinking- 0 -are/were unable to remember what happened the night before because of your drinking- 0  0- NO   2- yes but not in last year  4- yes during last year -Have you or someone else been injured because of your drinking- 0 - Has anyone been concerned about your drinking or suggested you cut down- 0        TOTAL- 1   ( 0-7- alcohol education, 8-15- simple advice, 16-19 simple advice plus counseling, 20-40 referral for evaluation and treatment 0   Designated Pharm[facility]- [facility]  Pain assessment: Cause of pain- Chronic back pain Pain location- low back Pain on scale of 1-10 : 6-7 Frequency-Intermittent What increases pain-Working outside What makes pain Better-Rest and pain medicaiton   Pain management agreement reviewed and signed- Yes

## 2016-02-12 ENCOUNTER — Other Ambulatory Visit: Payer: Self-pay | Admitting: *Deleted

## 2016-02-12 DIAGNOSIS — G4700 Insomnia, unspecified: Secondary | ICD-10-CM

## 2016-02-12 NOTE — Telephone Encounter (Signed)
Last seen 02/10/16, last filled 11/18/15

## 2016-02-13 MED ORDER — TRAZODONE HCL 50 MG PO TABS: 50.0000 mg | ORAL_TABLET | Freq: Every evening | ORAL | 5 refills | Status: DC | PRN

## 2016-02-16 LAB — TOXASSURE SELECT 13 (MW), URINE: PDF: 0

## 2016-04-08 ENCOUNTER — Other Ambulatory Visit: Payer: Self-pay | Admitting: *Deleted

## 2016-04-08 MED ORDER — CARBIDOPA-LEVODOPA ER 50-200 MG PO TBCR
1.0000 | EXTENDED_RELEASE_TABLET | Freq: Two times a day (BID) | ORAL | 0 refills | Status: DC
Start: 2016-04-08 — End: 2016-06-03

## 2016-04-30 ENCOUNTER — Other Ambulatory Visit: Payer: Self-pay | Admitting: Family Medicine

## 2016-04-30 DIAGNOSIS — M25561 Pain in right knee: Secondary | ICD-10-CM

## 2016-05-03 ENCOUNTER — Other Ambulatory Visit: Payer: Self-pay | Admitting: *Deleted

## 2016-05-03 DIAGNOSIS — G6289 Other specified polyneuropathies: Secondary | ICD-10-CM

## 2016-05-03 MED ORDER — GABAPENTIN 300 MG PO CAPS: 300.0000 mg | ORAL_CAPSULE | Freq: Three times a day (TID) | ORAL | 3 refills | Status: DC

## 2016-05-12 ENCOUNTER — Encounter: Payer: Self-pay | Admitting: Family

## 2016-05-12 ENCOUNTER — Ambulatory Visit (INDEPENDENT_AMBULATORY_CARE_PROVIDER_SITE_OTHER): Payer: Medicare Other | Admitting: Family

## 2016-05-12 VITALS — BP 138/73 | HR 75 | Temp 97.3°F | Ht 71.0 in | Wt 251.0 lb

## 2016-05-12 DIAGNOSIS — M545 Low back pain: Secondary | ICD-10-CM

## 2016-05-12 DIAGNOSIS — E039 Hypothyroidism, unspecified: Secondary | ICD-10-CM

## 2016-05-12 DIAGNOSIS — E669 Obesity, unspecified: Secondary | ICD-10-CM

## 2016-05-12 DIAGNOSIS — G6289 Other specified polyneuropathies: Secondary | ICD-10-CM

## 2016-05-12 DIAGNOSIS — F1120 Opioid dependence, uncomplicated: Secondary | ICD-10-CM

## 2016-05-12 DIAGNOSIS — M1990 Unspecified osteoarthritis, unspecified site: Secondary | ICD-10-CM

## 2016-05-12 DIAGNOSIS — Z0289 Encounter for other administrative examinations: Secondary | ICD-10-CM | POA: Diagnosis not present

## 2016-05-12 DIAGNOSIS — J301 Allergic rhinitis due to pollen: Secondary | ICD-10-CM

## 2016-05-12 DIAGNOSIS — I10 Essential (primary) hypertension: Secondary | ICD-10-CM | POA: Diagnosis not present

## 2016-05-12 DIAGNOSIS — G8929 Other chronic pain: Secondary | ICD-10-CM

## 2016-05-12 DIAGNOSIS — E785 Hyperlipidemia, unspecified: Secondary | ICD-10-CM | POA: Diagnosis not present

## 2016-05-12 DIAGNOSIS — J45909 Unspecified asthma, uncomplicated: Secondary | ICD-10-CM

## 2016-05-12 DIAGNOSIS — G4700 Insomnia, unspecified: Secondary | ICD-10-CM | POA: Diagnosis not present

## 2016-05-12 MED ORDER — HYDROCODONE-ACETAMINOPHEN 5-325 MG PO TABS: 1.0000 | ORAL_TABLET | Freq: Four times a day (QID) | ORAL | 0 refills | Status: DC | PRN

## 2016-05-12 NOTE — Addendum Note (Signed)
Addended by: Pollyann Kennedy F on: 05/12/2016 11:46 AM   Modules accepted: Orders

## 2016-05-12 NOTE — Patient Instructions (Signed)

## 2016-05-12 NOTE — Progress Notes (Signed)
Subjective:    Patient ID: Andrew Phelps, male    DOB: 13-Nov-1945, 70 y.o.   MRN: [id number]  Pt presents to the office today for chronic follow up and pain medication refill.  Hypertension  This is a chronic problem. The current episode started more than 1 year ago. The problem has been resolved since onset. The problem is controlled. Pertinent negatives include no anxiety, headaches, palpitations or peripheral edema. Risk factors for coronary artery disease include male gender and family history. Past treatments include ACE inhibitors. The current treatment provides moderate improvement. Hypertensive end-organ damage includes a thyroid problem. There is no history of kidney disease, CVA or heart failure.  Thyroid Problem  Presents for follow-up visit. Symptoms include dry skin and hoarse voice. Patient reports no anxiety, depressed mood, diarrhea, hair loss, leg swelling or palpitations. The symptoms have been stable. Past treatments include levothyroxine. The treatment provided significant relief. His past medical history is significant for hyperlipidemia. There is no history of heart failure.  Asthma  He complains of frequent throat clearing and hoarse voice. There is no cough, difficulty breathing or wheezing. This is a chronic problem. The current episode started more than 1 year ago. The problem occurs 2 to 4 times per day. The problem has been waxing and waning. Associated symptoms include nasal congestion, rhinorrhea and sneezing. Pertinent negatives include no ear congestion, ear pain, headaches or sore throat. His symptoms are aggravated by exercise, pollen and strenuous activity. His symptoms are alleviated by leukotriene antagonist. He reports complete improvement on treatment. His past medical history is significant for asthma.  Hyperlipidemia  This is a chronic problem. The current episode started more than 1 year ago. The problem is uncontrolled. Recent lipid tests were  reviewed and are high. Exacerbating diseases include obesity. Current antihyperlipidemic treatment includes diet change. The current treatment provides mild improvement of lipids. Risk factors for coronary artery disease include dyslipidemia, male sex, hypertension, obesity and a sedentary lifestyle.  Arthritis  Presents for follow-up visit. He complains of pain and stiffness. Affected locations include the left MCP, right MCP, right knee and left knee. His pain is at a severity of 6/10. Pertinent negatives include no diarrhea, pain at night or pain while resting. His pertinent risk factors include overuse. Past treatments include rest and an opioid. The treatment provided significant relief.  Insomnia  Primary symptoms: no sleep disturbance, no difficulty falling asleep, no somnolence.  The current episode started more than one year. The onset quality is gradual. The problem occurs intermittently. Past treatments include medication. The treatment provided significant relief. PMH includes: hypertension.  Back Pain  This is a chronic problem. The current episode started more than 1 year ago. The problem occurs intermittently. The problem has been waxing and waning since onset. The pain is present in the lumbar spine. The quality of the pain is described as aching. The pain is at a severity of 5/10. The pain is moderate. Pertinent negatives include no bladder incontinence or headaches. Risk factors include obesity. He has tried NSAIDs and analgesics for the symptoms. The treatment provided moderate relief.  Peripheral Neuropathy Pt states he has burning, stinging pain in bilateral feet that started years ago. Pt state it is unchanged. Pt currently gabapentin 300 mg that helps.     Review of Systems  Constitutional: Negative.   HENT: Positive for hoarse voice, rhinorrhea and sneezing. Negative for ear pain and sore throat.   Respiratory: Negative.  Negative for cough and wheezing.  Cardiovascular:  Negative.  Negative for palpitations.  Gastrointestinal: Negative.  Negative for diarrhea.  Endocrine: Negative.   Genitourinary: Negative.  Negative for bladder incontinence.  Musculoskeletal: Positive for arthritis, back pain and stiffness.  Neurological: Negative.  Negative for headaches.  Hematological: Negative.   Psychiatric/Behavioral: Negative for sleep disturbance. The patient has insomnia. The patient is not nervous/anxious.   All other systems reviewed and are negative.      Objective:   Physical Exam  Constitutional: He is oriented to person, place, and time. He appears well-developed and well-nourished. No distress.  HENT:  Head: Normocephalic.  Right Ear: External ear normal.  Left Ear: External ear normal.  Nose: Nose normal.  Mouth/Throat: Oropharynx is clear and moist.  Eyes: Pupils are equal, round, and reactive to light. Right eye exhibits no discharge. Left eye exhibits no discharge.  Neck: Normal range of motion. Neck supple. No thyromegaly present.  Cardiovascular: Normal rate, regular rhythm, normal heart sounds and intact distal pulses.   No murmur heard. Pulmonary/Chest: Effort normal and breath sounds normal. No respiratory distress. He has no wheezes.  Abdominal: Soft. Bowel sounds are normal. He exhibits no distension. There is no tenderness.  Musculoskeletal: Normal range of motion. He exhibits no edema or tenderness.  Neurological: He is alert and oriented to person, place, and time.  Skin: Skin is warm and dry. No rash noted. No erythema.  Psychiatric: He has a normal mood and affect. His behavior is normal. Judgment and thought content normal.  Vitals reviewed.   Pain assessment: Cause of pain-  Pain location- Chronic back pain and arthritis  Pain on scale of 1-10- 6 Frequency-Constantly What increases pain-Standing and walking What makes pain Better-rest and pain medication  Current medications- Norco 5-325 mg every 6 prn, #30 a  month Effectiveness of current meds-stable   [place] drug screen- No Was the [place] reviewed- Yes, 02/06/16  If yes were their any concerning findings? - Only received controlled medication from me   BP (!) 146/77   Pulse 78   Temp 97.3 F (36.3 C) (Oral)   Ht _0  (1.803 m)   Wt 251 lb (113.9 kg)   BMI 35.01 kg/m      Assessment & Plan:  1. Essential hypertension - CMP14+EGFR  2. Allergic rhinitis due to pollen, unspecified chronicity, unspecified seasonality - CMP14+EGFR  3. Chronic asthma without complication, unspecified asthma severity, unspecified whether persistent - CMP14+EGFR  4. Hypothyroidism, unspecified type - CMP14+EGFR  5. Other polyneuropathy ([place] - CMP14+EGFR  6. Arthritis  - HYDROcodone-acetaminophen (NORCO/VICODIN) 5-325 MG tablet; Take 1 tablet by mouth every 6 (six) hours as needed for moderate pain.  Dispense: 30 tablet; Refill: 0 - HYDROcodone-acetaminophen (NORCO) 5-325 MG tablet; Take 1 tablet by mouth every 6 (six) hours as needed for moderate pain.  Dispense: 30 tablet; Refill: 0 - HYDROcodone-acetaminophen (NORCO/VICODIN) 5-325 MG tablet; Take 1 tablet by mouth every 6 (six) hours as needed for moderate pain.  Dispense: 30 tablet; Refill: 0 - CMP14+EGFR  7. Chronic bilateral low back pain, with sciatica presence unspecified - CMP14+EGFR  8. Hyperlipidemia, unspecified hyperlipidemia type - CMP14+EGFR  9. Pain medication agreement signed - HYDROcodone-acetaminophen (NORCO/VICODIN) 5-325 MG tablet; Take 1 tablet by mouth every 6 (six) hours as needed for moderate pain.  Dispense: 30 tablet; Refill: 0 - HYDROcodone-acetaminophen (NORCO) 5-325 MG tablet; Take 1 tablet by mouth every 6 (six) hours as needed for moderate pain.  Dispense: 30 tablet; Refill: 0 - HYDROcodone-acetaminophen (NORCO/VICODIN) 5-325 MG tablet; Take  1 tablet by mouth every 6 (six) hours as needed for moderate pain.  Dispense: 30 tablet; Refill: 0 - CMP14+EGFR  10.  Uncomplicated opioid dependence (HCC)  - HYDROcodone-acetaminophen (NORCO/VICODIN) 5-325 MG tablet; Take 1 tablet by mouth every 6 (six) hours as needed for moderate pain.  Dispense: 30 tablet; Refill: 0 - HYDROcodone-acetaminophen (NORCO) 5-325 MG tablet; Take 1 tablet by mouth every 6 (six) hours as needed for moderate pain.  Dispense: 30 tablet; Refill: 0 - HYDROcodone-acetaminophen (NORCO/VICODIN) 5-325 MG tablet; Take 1 tablet by mouth every 6 (six) hours as needed for moderate pain.  Dispense: 30 tablet; Refill: 0 - CMP14+EGFR  11. Obesity (BMI 30-39.9) - CMP14+EGFR  12. Insomnia, unspecified type - CMP14+EGFR  13. Chronic bilateral low back pain without sciatica - HYDROcodone-acetaminophen (NORCO/VICODIN) 5-325 MG tablet; Take 1 tablet by mouth every 6 (six) hours as needed for moderate pain.  Dispense: 30 tablet; Refill: 0 - HYDROcodone-acetaminophen (NORCO) 5-325 MG tablet; Take 1 tablet by mouth every 6 (six) hours as needed for moderate pain.  Dispense: 30 tablet; Refill: 0 - HYDROcodone-acetaminophen (NORCO/VICODIN) 5-325 MG tablet; Take 1 tablet by mouth every 6 (six) hours as needed for moderate pain.  Dispense: 30 tablet; Refill: 0 - CMP14+EGFR   Continue all meds Labs pending Health Maintenance reviewed Diet and exercise encouraged RTO 3 months  Evelina Dun, FNP

## 2016-05-13 LAB — CMP14+EGFR
ALT: 14 IU/L (ref 0–44)
AST: 15 IU/L (ref 0–40)
Albumin/Globulin Ratio: 1.6 (ref 1.2–2.2)
Albumin: 4.2 g/dL (ref 3.6–4.8)
Alkaline Phosphatase: 87 IU/L (ref 39–117)
BUN/Creatinine Ratio: 8 — ABNORMAL LOW (ref 10–24)
BUN: 15 mg/dL (ref 8–27)
Bilirubin Total: 0.9 mg/dL (ref 0.0–1.2)
CALCIUM: 8.8 mg/dL (ref 8.6–10.2)
CO2: 19 mmol/L (ref 18–29)
CREATININE: 1.91 mg/dL — AB (ref 0.76–1.27)
Chloride: 98 mmol/L (ref 96–106)
GFR calc Af Amer: 41 mL/min/{1.73_m2} — ABNORMAL LOW (ref >59)
GFR, EST NON AFRICAN AMERICAN: 35 mL/min/{1.73_m2} — AB (ref >59)
Globulin, Total: 2.7 g/dL (ref 1.5–4.5)
Glucose: 86 mg/dL (ref 65–99)
Potassium: 4.4 mmol/L (ref 3.5–5.2)
Sodium: 135 mmol/L (ref 134–144)
Total Protein: 6.9 g/dL (ref 6.0–8.5)

## 2016-05-13 LAB — FECAL OCCULT BLOOD, IMMUNOCHEMICAL: Fecal Occult Bld: POSITIVE — AB

## 2016-05-17 ENCOUNTER — Other Ambulatory Visit: Payer: Self-pay | Admitting: Family

## 2016-05-17 DIAGNOSIS — R195 Other fecal abnormalities: Secondary | ICD-10-CM

## 2016-06-03 ENCOUNTER — Other Ambulatory Visit: Payer: Self-pay | Admitting: *Deleted

## 2016-06-03 DIAGNOSIS — J3089 Other allergic rhinitis: Secondary | ICD-10-CM

## 2016-06-03 DIAGNOSIS — I10 Essential (primary) hypertension: Secondary | ICD-10-CM

## 2016-06-03 MED ORDER — LISINOPRIL 10 MG PO TABS: 10.0000 mg | ORAL_TABLET | Freq: Every day | ORAL | 1 refills | Status: DC

## 2016-06-03 MED ORDER — CARBIDOPA-LEVODOPA ER 50-200 MG PO TBCR: 1.0000 | EXTENDED_RELEASE_TABLET | Freq: Two times a day (BID) | ORAL | 1 refills | Status: DC

## 2016-06-03 MED ORDER — FLUTICASONE PROPIONATE 50 MCG/ACT NA SUSP: 2.0000 | Freq: Every day | NASAL | 1 refills | Status: DC

## 2016-06-08 ENCOUNTER — Encounter: Payer: Self-pay | Admitting: Family

## 2016-06-08 ENCOUNTER — Ambulatory Visit (INDEPENDENT_AMBULATORY_CARE_PROVIDER_SITE_OTHER): Payer: Medicare Other | Admitting: Family

## 2016-06-08 VITALS — BP 138/72 | HR 78 | Temp 98.1°F | Ht 71.0 in | Wt 243.6 lb

## 2016-06-08 DIAGNOSIS — M109 Gout, unspecified: Secondary | ICD-10-CM

## 2016-06-08 DIAGNOSIS — M79641 Pain in right hand: Secondary | ICD-10-CM | POA: Diagnosis not present

## 2016-06-08 MED ORDER — METHYLPREDNISOLONE ACETATE 80 MG/ML IJ SUSP
80.0000 mg | Freq: Once | INTRAMUSCULAR | Status: AC
  Administered 2016-06-08: 80 mg via INTRAMUSCULAR

## 2016-06-08 MED ORDER — COLCHICINE 0.6 MG PO TABS: ORAL_TABLET | ORAL | 3 refills | Status: DC

## 2016-06-08 MED ORDER — KETOROLAC TROMETHAMINE 60 MG/2ML IM SOLN
30.0000 mg | Freq: Once | INTRAMUSCULAR | Status: AC
  Administered 2016-06-08: 30 mg via INTRAMUSCULAR

## 2016-06-08 NOTE — Progress Notes (Signed)
   Subjective:    Patient ID: Andrew Phelps, male    DOB: [birth year], 70 y.o.   MRN: [id number]  HPI PT presents to the office today with new right hand swelling that started a few weeks ago and has become worse. PT states he is having constant aching pain of 10 out 10 and it hurts to move or touch his hand. States his hand is warm. Pt states he has never been diagnosed with gout before. He is also having pain in "all of my toes".  Denies any injury to his hand.    Review of Systems  Musculoskeletal: Positive for arthralgias and joint swelling.  All other systems reviewed and are negative.      Objective:   Physical Exam  Constitutional: He is oriented to person, place, and time. He appears well-developed and well-nourished.  Cardiovascular: Normal rate, regular rhythm, normal heart sounds and intact distal pulses.   Pulmonary/Chest: Effort normal and breath sounds normal.  Musculoskeletal: He exhibits edema and tenderness.  3+ edema in right index middle and thumb, slightly erythemas and warm to touch  Neurological: He is alert and oriented to person, place, and time.  Skin: Skin is warm and dry. There is erythema.     BP 138/72   Pulse 78   Temp 98.1 F (36.7 C) (Oral)   Ht '5\' 11"'$  (1.803 m)   Wt 243 lb 9.6 oz (110.5 kg)   BMI 33.98 kg/m       Assessment & Plan:  1. Pain of right hand - CMP14+EGFR - Uric acid  2. Acute gout of right hand, unspecified cause -Diet discussed -Labs pending Will hold off on starting allopurinol today - CMP14+EGFR - Uric acid - methylPREDNISolone acetate (DEPO-MEDROL) injection 80 mg; Inject 1 mL (80 mg total) into the muscle once. - ketorolac (TORADOL) injection 30 mg; Inject 1 mL (30 mg total) into the muscle once. - colchicine 0.6 MG tablet; 1.2 mg PO X1 then 0.6 mg PO 1 hour later X1, MAX 1.8 mg a day  Dispense: 20 tablet; Refill: [place], FNP

## 2016-06-08 NOTE — Patient Instructions (Signed)

## 2016-06-09 ENCOUNTER — Other Ambulatory Visit: Payer: Self-pay | Admitting: Family

## 2016-06-09 LAB — CMP14+EGFR
ALT: 18 IU/L (ref 0–44)
AST: 17 IU/L (ref 0–40)
Albumin/Globulin Ratio: 1.3 (ref 1.2–2.2)
Albumin: 4.2 g/dL (ref 3.6–4.8)
Alkaline Phosphatase: 112 IU/L (ref 39–117)
BILIRUBIN TOTAL: 1.1 mg/dL (ref 0.0–1.2)
BUN/Creatinine Ratio: 7 — ABNORMAL LOW (ref 10–24)
BUN: 12 mg/dL (ref 8–27)
CHLORIDE: 95 mmol/L — AB (ref 96–106)
CO2: 23 mmol/L (ref 18–29)
CREATININE: 1.66 mg/dL — AB (ref 0.76–1.27)
Calcium: 9.1 mg/dL (ref 8.6–10.2)
GFR calc non Af Amer: 42 mL/min/{1.73_m2} — ABNORMAL LOW (ref >59)
GFR, EST AFRICAN AMERICAN: 48 mL/min/{1.73_m2} — AB (ref >59)
GLUCOSE: 82 mg/dL (ref 65–99)
Globulin, Total: 3.3 g/dL (ref 1.5–4.5)
Potassium: 4.7 mmol/L (ref 3.5–5.2)
Sodium: 134 mmol/L (ref 134–144)
TOTAL PROTEIN: 7.5 g/dL (ref 6.0–8.5)

## 2016-06-09 LAB — URIC ACID: URIC ACID: 10.3 mg/dL — AB (ref 3.7–8.6)

## 2016-06-09 MED ORDER — ALLOPURINOL 100 MG PO TABS: 100.0000 mg | ORAL_TABLET | Freq: Every day | ORAL | 1 refills | Status: DC

## 2016-07-13 ENCOUNTER — Encounter: Payer: Self-pay | Admitting: Family Medicine

## 2016-07-13 ENCOUNTER — Ambulatory Visit (INDEPENDENT_AMBULATORY_CARE_PROVIDER_SITE_OTHER): Payer: Medicare Other | Admitting: Family Medicine

## 2016-07-13 VITALS — BP 108/68 | HR 81 | Temp 97.5°F | Ht 71.0 in | Wt 234.0 lb

## 2016-07-13 DIAGNOSIS — R300 Dysuria: Secondary | ICD-10-CM | POA: Diagnosis not present

## 2016-07-13 DIAGNOSIS — M25562 Pain in left knee: Secondary | ICD-10-CM

## 2016-07-13 DIAGNOSIS — M25561 Pain in right knee: Secondary | ICD-10-CM

## 2016-07-13 DIAGNOSIS — R822 Biliuria: Secondary | ICD-10-CM | POA: Diagnosis not present

## 2016-07-13 DIAGNOSIS — R17 Unspecified jaundice: Secondary | ICD-10-CM

## 2016-07-13 LAB — MICROSCOPIC EXAMINATION
BACTERIA UA: NONE SEEN
Epithelial Cells (non renal): NONE SEEN /hpf (ref 0–10)
RBC MICROSCOPIC, UA: NONE SEEN /HPF (ref 0–?)
Renal Epithel, UA: NONE SEEN /hpf
WBC UA: NONE SEEN /HPF (ref 0–?)

## 2016-07-13 LAB — URINALYSIS, COMPLETE
Bilirubin, UA: NEGATIVE
Glucose, UA: NEGATIVE
Ketones, UA: NEGATIVE
Leukocytes, UA: NEGATIVE
Nitrite, UA: NEGATIVE
PH UA: 5.5 (ref 5.0–7.5)
Protein, UA: NEGATIVE
RBC, UA: NEGATIVE
Specific Gravity, UA: 1.025 (ref 1.005–1.030)
UUROB: 1 mg/dL (ref 0.2–1.0)

## 2016-07-13 MED ORDER — TRIAMCINOLONE ACETONIDE 40 MG/ML IJ SUSP
40.0000 mg | Freq: Once | INTRAMUSCULAR | Status: AC
  Administered 2016-07-13: 40 mg via INTRAMUSCULAR

## 2016-07-13 NOTE — Progress Notes (Signed)
Subjective:    Patient ID: Andrew Phelps, male    DOB: 08-28-1945, 71 y.o.   MRN: [id number]  HPI patient is here with urinary symptoms, discolored urine and dysuria. He denies fever chills or back pain. It is difficult to get him to focus on his symptoms. He tells me about gout, knee pain. I note from his problem list he has chronic back pain, peripheral neuropathy and arthritis as well as gout.   Patient Active Problem List   Diagnosis Date Noted  . Pain medication agreement signed 02/06/2016  . Opioid dependence ([place]) 02/06/2016  . Obesity (BMI 30-39.9) 01/02/2016  . Peripheral neuropathy ([place]) 01/02/2016  . Chronic back pain 01/02/2016  . Essential hypertension 12/11/2014  . Hypothyroidism 12/11/2014  . Allergic rhinitis 12/11/2014  . Insomnia 12/11/2014  . Arthritis 12/11/2014  . Hyperlipidemia 12/11/2014  . Asthma, chronic 12/11/2014   Outpatient Encounter Prescriptions as of 07/13/2016  Medication Sig  . allopurinol (ZYLOPRIM) 100 MG tablet Take 1 tablet (100 mg total) by mouth daily.  . carbidopa-levodopa (SINEMET CR) 50-200 MG tablet Take 1 tablet by mouth 2 (two) times daily.  . colchicine 0.6 MG tablet 1.2 mg PO X1 then 0.6 mg PO 1 hour later X1, MAX 1.8 mg a day  . fluticasone (FLONASE) 50 MCG/ACT nasal spray Place 2 sprays into both nostrils daily.  Marland Kitchen gabapentin (NEURONTIN) 300 MG capsule Take 1 capsule (300 mg total) by mouth 3 (three) times daily.  Marland Kitchen HYDROcodone-acetaminophen (NORCO) 5-325 MG tablet Take 1 tablet by mouth every 6 (six) hours as needed for moderate pain.  Marland Kitchen HYDROcodone-acetaminophen (NORCO/VICODIN) 5-325 MG tablet Take 1 tablet by mouth every 6 (six) hours as needed for moderate pain.  Marland Kitchen HYDROcodone-acetaminophen (NORCO/VICODIN) 5-325 MG tablet Take 1 tablet by mouth every 6 (six) hours as needed for moderate pain.  Marland Kitchen levothyroxine (SYNTHROID, LEVOTHROID) 88 MCG tablet Take 1 tablet (88 mcg total) by mouth daily.  Marland Kitchen lisinopril  (PRINIVIL,ZESTRIL) 10 MG tablet Take 1 tablet (10 mg total) by mouth daily.  Marland Kitchen loratadine (CLARITIN) 10 MG tablet Take 1 tablet (10 mg total) by mouth daily.  . montelukast (SINGULAIR) 10 MG tablet Take 1 tablet (10 mg total) by mouth at bedtime.  . traZODone (DESYREL) 50 MG tablet Take 1 tablet (50 mg total) by mouth at bedtime as needed for sleep.   No facility-administered encounter medications on file as of 07/13/2016.         Review of Systems  Constitutional: Negative.   Respiratory: Negative.   Genitourinary: Positive for dysuria.  Neurological: Negative.          Objective:   Physical Exam  Constitutional: He is oriented to person, place, and time. He appears well-developed and well-nourished.  Abdominal:  No CVAT  Musculoskeletal:  Knees show no evidence of acute arthritic flare, and other words no erythema or increased temperature  Neurological: He is alert and oriented to person, place, and time.  Psychiatric: He has a normal mood and affect. His behavior is normal.    BP 108/68   Pulse 81   Temp 97.5 F (36.4 C) (Oral)   Ht 5\' 11"  (1.803 m)   Wt 234 lb (106.1 kg)   BMI 32.64 kg/m        Assessment & Plan:  1. Dysuria Urine is certainly discolored due to presence of bilirubin - Urine culture - Urinalysis, Complete  2. Pain in both knees, unspecified chronicity I think this is related probably to some degenerative  arthritis rather than gout - triamcinolone acetonide (KENALOG-40) injection 40 mg; Inject 1 mL (40 mg total) into the muscle once. - Uric acid  3. Bilirubin in urine Need to assess liver functions  - Hepatic function panel  Wardell Honour MD

## 2016-07-14 LAB — HEPATIC FUNCTION PANEL
ALBUMIN: 4.1 g/dL (ref 3.6–4.8)
ALT: 9 IU/L (ref 0–44)
AST: 24 IU/L (ref 0–40)
Alkaline Phosphatase: 119 IU/L — ABNORMAL HIGH (ref 39–117)
BILIRUBIN TOTAL: 2.9 mg/dL — AB (ref 0.0–1.2)
BILIRUBIN, DIRECT: 0.81 mg/dL — AB (ref 0.00–0.40)
Total Protein: 7.4 g/dL (ref 6.0–8.5)

## 2016-07-14 LAB — URIC ACID: Uric Acid: 7.9 mg/dL (ref 3.7–8.6)

## 2016-07-14 NOTE — Addendum Note (Signed)
Addended by: Shelbie Ammons on: 07/14/2016 04:55 PM   Modules accepted: Orders

## 2016-07-16 LAB — URINE CULTURE

## 2016-07-19 ENCOUNTER — Encounter: Payer: Self-pay | Admitting: Internal Medicine

## 2016-07-22 MED ORDER — CIPROFLOXACIN HCL 500 MG PO TABS: 500.0000 mg | ORAL_TABLET | Freq: Two times a day (BID) | ORAL | 0 refills | Status: DC

## 2016-07-22 NOTE — Addendum Note (Signed)
Addended by: Thana Ates on: 07/22/2016 08:45 AM   Modules accepted: Orders

## 2016-08-12 ENCOUNTER — Ambulatory Visit (INDEPENDENT_AMBULATORY_CARE_PROVIDER_SITE_OTHER): Payer: Medicare Other | Admitting: Gastroenterology

## 2016-08-12 ENCOUNTER — Encounter: Payer: Self-pay | Admitting: Gastroenterology

## 2016-08-12 VITALS — BP 120/75 | HR 76 | Temp 97.8°F | Ht 71.0 in | Wt 231.6 lb

## 2016-08-12 DIAGNOSIS — R17 Unspecified jaundice: Secondary | ICD-10-CM | POA: Diagnosis not present

## 2016-08-12 LAB — CBC
HEMATOCRIT: 35.6 % — AB (ref 38.5–50.0)
Hemoglobin: 11.9 g/dL — ABNORMAL LOW (ref 13.2–17.1)
MCH: 30 pg (ref 27.0–33.0)
MCHC: 33.4 g/dL (ref 32.0–36.0)
MCV: 89.7 fL (ref 80.0–100.0)
MPV: 10.5 fL (ref 7.5–12.5)
Platelets: 161 10*3/uL (ref 140–400)
RBC: 3.97 MIL/uL — ABNORMAL LOW (ref 4.20–5.80)
RDW: 13.9 % (ref 11.0–15.0)
WBC: 7.5 10*3/uL (ref 3.8–10.8)

## 2016-08-12 LAB — BILIRUBIN, FRACTIONATED(TOT/DIR/INDIR)
Bilirubin, Direct: 0.2 mg/dL (ref <=0.2)
Indirect Bilirubin: 0.8 mg/dL (ref 0.2–1.2)
Total Bilirubin: 1 mg/dL (ref 0.2–1.2)

## 2016-08-12 LAB — HEPATIC FUNCTION PANEL
ALBUMIN: 3.9 g/dL (ref 3.6–5.1)
ALK PHOS: 90 U/L (ref 40–115)
ALT: 7 U/L — AB (ref 9–46)
AST: 24 U/L (ref 10–35)
Bilirubin, Direct: 0.2 mg/dL (ref <=0.2)
Indirect Bilirubin: 0.8 mg/dL (ref 0.2–1.2)
TOTAL PROTEIN: 6.8 g/dL (ref 6.1–8.1)
Total Bilirubin: 1 mg/dL (ref 0.2–1.2)

## 2016-08-12 LAB — BILIRUBIN,DIRECT & INDIRECT (FRACTIONATED)
BILIRUBIN DIRECT: 0.2 mg/dL (ref <=0.2)
BILIRUBIN INDIRECT: 0.8 mg/dL (ref 0.2–1.2)

## 2016-08-12 NOTE — Patient Instructions (Signed)
Please have blood work done today.   We have ordered an ultrasound of your liver.   Further recommendations to follow.  At some point we should discuss pursuing a colonoscopy when you are ready.

## 2016-08-12 NOTE — Assessment & Plan Note (Addendum)
71 year old male with elevated bilirubin, direct elevated bilirubin, unknown indirect. Transaminases normal. Alk Phos just mildly elevated and not impressive. History of chronic heavy ETOH abuse, now stopped since being told about liver numbers. Will proceed with repeat HFP and fractionate bilirubin to assess for any evidence of Gilbert's, check CBC, proceed with ultrasound, and further recommendations to follow. Will need initial screening colonoscopy at some point although he is hesitant to pursue at this moment.

## 2016-08-12 NOTE — Progress Notes (Signed)
Primary Care Physician:  Evelina Dun, FNP Primary Gastroenterologist:  Dr. Gala Romney   Chief Complaint  Patient presents with  . Elevated Bilirubin    HPI:   Andrew Phelps is a 71 y.o. male presenting today at the request of his PCP secondary to elevated bilirubin. Total bilirubin 2.9, direct 0.81, Alk Phos 119. Normal transaminases. Appears total bilirubin just mildly elevated in the past as well. No imaging.   Quit drinking. Used to drink 4-6 beers a day. Then states he used to drink 15-16 a day. Patient's friend is with him and wants A1c done. No abdominal pain, N/V, confusion. Denies any jaundice. No rectal bleeding. No colonoscopy or upper endoscopy.    Past Medical History:  Diagnosis Date  . Arthritis   . Back pain   . Gout   . Hypertension   . Insomnia   . Joint pain   . Thyroid disease     Past Surgical History:  Procedure Laterality Date  . none      Current Outpatient Prescriptions  Medication Sig Dispense Refill  . allopurinol (ZYLOPRIM) 100 MG tablet Take 1 tablet (100 mg total) by mouth daily. 90 tablet 1  . carbidopa-levodopa (SINEMET CR) 50-200 MG tablet Take 1 tablet by mouth 2 (two) times daily. 90 tablet 1  . colchicine 0.6 MG tablet 1.2 mg PO X1 then 0.6 mg PO 1 hour later X1, MAX 1.8 mg a day 20 tablet 3  . fluticasone (FLONASE) 50 MCG/ACT nasal spray Place 2 sprays into both nostrils daily. 16 g 1  . gabapentin (NEURONTIN) 300 MG capsule Take 1 capsule (300 mg total) by mouth 3 (three) times daily. 90 capsule 3  . HYDROcodone-acetaminophen (NORCO) 5-325 MG tablet Take 1 tablet by mouth every 6 (six) hours as needed for moderate pain. 30 tablet 0  . levothyroxine (SYNTHROID, LEVOTHROID) 88 MCG tablet Take 1 tablet (88 mcg total) by mouth daily. 90 tablet 2  . lisinopril (PRINIVIL,ZESTRIL) 10 MG tablet Take 1 tablet (10 mg total) by mouth daily. 90 tablet 1  . loratadine (CLARITIN) 10 MG tablet Take 1 tablet (10 mg total) by mouth daily. 30  tablet 11  . montelukast (SINGULAIR) 10 MG tablet Take 1 tablet (10 mg total) by mouth at bedtime. 90 tablet 2  . traZODone (DESYREL) 50 MG tablet Take 1 tablet (50 mg total) by mouth at bedtime as needed for sleep. 60 tablet 5   No current facility-administered medications for this visit.     Allergies as of 08/12/2016 - Review Complete 08/12/2016  Allergen Reaction Noted  . Penicillins  06/05/2013    Family History  Problem Relation Age of Onset  . Heart disease Mother   . Heart disease Father   . Hypertension Brother   . Hyperlipidemia Brother   . Colon cancer Neg Hx   . Liver disease Neg Hx     Social History   Social History  . Marital status: Divorced    Spouse name: N/A  . Number of children: N/A  . Years of education: N/A   Occupational History  . Not on file.   Social History Main Topics  . Smoking status: Former Smoker    Quit date: 07/06/1995  . Smokeless tobacco: Current User    Types: Chew  . Alcohol use Yes     Comment: history of heavy ETOH use, states quit drinking as of 08/12/16 visit   . Drug use: No  . Sexual activity: Not on file  Other Topics Concern  . Not on file   Social History Narrative  . No narrative on file    Review of Systems: Gen: Denies any fever, chills, fatigue, weight loss, lack of appetite.  CV: Denies chest pain, heart palpitations, peripheral edema, syncope.  Resp: Denies shortness of breath at rest or with exertion. Denies wheezing or cough.  GI: see HPI  GU : Denies urinary burning, urinary frequency, urinary hesitancy MS: +joint pain  Derm: Denies rash, itching, dry skin Psych: Denies depression, anxiety, memory loss, and confusion Heme: Denies bruising, bleeding, and enlarged lymph nodes.  Physical Exam: BP 120/75   Pulse 76   Temp 97.8 F (36.6 C) (Oral)   Ht '5\' 11"'$  (1.803 m)   Wt 231 lb 9.6 oz (105.1 kg)   BMI 32.30 kg/m  General:   Alert and oriented. Pleasant and cooperative. Well-nourished and  well-developed.  Head:  Normocephalic and atraumatic. Eyes:  Without icterus, sclera clear and conjunctiva pink.  Ears:  Normal auditory acuity. Nose:  No deformity, discharge,  or lesions. Mouth:  No deformity or lesions, oral mucosa pink.  Lungs:  Clear to auscultation bilaterally. No wheezes, rales, or rhonchi. No distress.  Heart:  S1, S2 present without murmurs appreciated.  Abdomen:  +BS, soft, non-tender and non-distended. No HSM noted. No guarding or rebound. No masses appreciated.  Rectal:  Deferred  Msk:  Symmetrical without gross deformities. Normal posture. Extremities:  Without edema. Neurologic:  Alert and  oriented x4  Psych:  Alert and cooperative. Normal mood and affect.  Lab Results  Component Value Date   ALT 9 07/13/2016   AST 24 07/13/2016   ALKPHOS 119 (H) 07/13/2016   BILITOT 2.9 (H) 07/13/2016   Lab Results  Component Value Date   CREATININE 1.66 (H) 06/08/2016   BUN 12 06/08/2016   NA 134 06/08/2016   K 4.7 06/08/2016   CL 95 (L) 06/08/2016   CO2 23 06/08/2016

## 2016-08-12 NOTE — Progress Notes (Signed)
CC'ED TO PCP 

## 2016-08-16 ENCOUNTER — Encounter: Payer: Self-pay | Admitting: Family

## 2016-08-16 ENCOUNTER — Ambulatory Visit (INDEPENDENT_AMBULATORY_CARE_PROVIDER_SITE_OTHER): Payer: Medicare Other | Admitting: Family

## 2016-08-16 VITALS — BP 115/74 | HR 76 | Temp 97.5°F | Ht 71.0 in | Wt 234.2 lb

## 2016-08-16 DIAGNOSIS — J45909 Unspecified asthma, uncomplicated: Secondary | ICD-10-CM | POA: Diagnosis not present

## 2016-08-16 DIAGNOSIS — E039 Hypothyroidism, unspecified: Secondary | ICD-10-CM | POA: Diagnosis not present

## 2016-08-16 DIAGNOSIS — F1120 Opioid dependence, uncomplicated: Secondary | ICD-10-CM | POA: Diagnosis not present

## 2016-08-16 DIAGNOSIS — G4700 Insomnia, unspecified: Secondary | ICD-10-CM | POA: Diagnosis not present

## 2016-08-16 DIAGNOSIS — M1990 Unspecified osteoarthritis, unspecified site: Secondary | ICD-10-CM

## 2016-08-16 DIAGNOSIS — G8929 Other chronic pain: Secondary | ICD-10-CM

## 2016-08-16 DIAGNOSIS — G6289 Other specified polyneuropathies: Secondary | ICD-10-CM | POA: Diagnosis not present

## 2016-08-16 DIAGNOSIS — M545 Low back pain, unspecified: Secondary | ICD-10-CM

## 2016-08-16 DIAGNOSIS — E785 Hyperlipidemia, unspecified: Secondary | ICD-10-CM

## 2016-08-16 DIAGNOSIS — M1A9XX0 Chronic gout, unspecified, without tophus (tophi): Secondary | ICD-10-CM

## 2016-08-16 DIAGNOSIS — I10 Essential (primary) hypertension: Secondary | ICD-10-CM

## 2016-08-16 DIAGNOSIS — E669 Obesity, unspecified: Secondary | ICD-10-CM

## 2016-08-16 DIAGNOSIS — Z0289 Encounter for other administrative examinations: Secondary | ICD-10-CM

## 2016-08-16 MED ORDER — HYDROCODONE-ACETAMINOPHEN 5-325 MG PO TABS: 1.0000 | ORAL_TABLET | Freq: Four times a day (QID) | ORAL | 0 refills | Status: DC | PRN

## 2016-08-16 NOTE — Addendum Note (Signed)
Addended by: Evelina Dun A on: 08/16/2016 09:27 AM   Modules accepted: Orders

## 2016-08-16 NOTE — Progress Notes (Addendum)
Subjective:    Patient ID: Andrew Phelps, male    DOB: 1945-07-09, 71 y.o.   MRN: [id number]  Pt presents to the office today for chronic follow up and pain medication refill. PT has seen GI for elevated bilirubin. Pt has abdomen ultrasound scheduled for March 5th.  Hypertension  This is a chronic problem. The current episode started more than 1 year ago. The problem has been resolved since onset. The problem is controlled. Pertinent negatives include no anxiety, headaches, palpitations or peripheral edema. Risk factors for coronary artery disease include male gender and family history. Past treatments include ACE inhibitors. The current treatment provides moderate improvement. There is no history of kidney disease, CVA or heart failure. Identifiable causes of hypertension include a thyroid problem.  Thyroid Problem  Presents for follow-up visit. Symptoms include hoarse voice. Patient reports no anxiety, depressed mood, diarrhea, dry skin, hair loss, leg swelling or palpitations. The symptoms have been stable. Past treatments include levothyroxine. The treatment provided significant relief. His past medical history is significant for hyperlipidemia. There is no history of heart failure.  Asthma  He complains of hoarse voice. There is no cough, difficulty breathing, frequent throat clearing or wheezing. This is a chronic problem. The current episode started more than 1 year ago. The problem occurs 2 to 4 times per day. The problem has been waxing and waning. Associated symptoms include nasal congestion, rhinorrhea and sneezing. Pertinent negatives include no ear congestion, ear pain, headaches or sore throat. His symptoms are aggravated by exercise, pollen and strenuous activity. His symptoms are alleviated by leukotriene antagonist. He reports complete improvement on treatment. His past medical history is significant for asthma.  Hyperlipidemia  This is a chronic problem. The current episode  started more than 1 year ago. The problem is uncontrolled. Recent lipid tests were reviewed and are high. Exacerbating diseases include obesity. Current antihyperlipidemic treatment includes diet change. The current treatment provides mild improvement of lipids. Risk factors for coronary artery disease include dyslipidemia, male sex, hypertension, obesity and a sedentary lifestyle.  Arthritis  Presents for follow-up visit. He complains of pain and stiffness. Affected locations include the left MCP, right MCP, right knee and left knee. His pain is at a severity of 7/10. Pertinent negatives include no diarrhea, pain at night or pain while resting. His pertinent risk factors include overuse. Past treatments include rest and an opioid. The treatment provided significant relief.  Insomnia  Primary symptoms: no sleep disturbance, no difficulty falling asleep, no somnolence.  The current episode started more than one year. The onset quality is gradual. The problem occurs intermittently. Past treatments include medication. The treatment provided significant relief. PMH includes: hypertension.  Back Pain  This is a chronic problem. The current episode started more than 1 year ago. The problem occurs intermittently. The problem has been waxing and waning since onset. The pain is present in the lumbar spine. The quality of the pain is described as aching. The pain is at a severity of 8/10. The pain is moderate. Pertinent negatives include no bladder incontinence or headaches. Risk factors include obesity. He has tried NSAIDs and analgesics for the symptoms. The treatment provided moderate relief.  Peripheral Neuropathy Pt states he has burning, stinging pain in bilateral feet that started years ago. Pt state it is unchanged. Pt currently gabapentin 300 mg that helps.  Gout Pt currently taking allopurinol daily. PT states he was taking his colchicine daily because he thought he was suppose to be taking daily.  Discussed only taking colchicine as needed.   Pain assessment: Cause of pain- Chronic back pan and arthritis  Pain location- Hands and lower back Pain on scale of 1-10- 8 Frequency- intermittently   Current medications- Norco 5-35m every 6 hours as needed, #30  Effectiveness of current meds-PT states he needs more than 1 tab a day Urine drug screen- No, 02/06/16 [place]  If yes were their any concerning findings? - Only received controlled medications from me.    Review of Systems  Constitutional: Negative.   HENT: Positive for hoarse voice, rhinorrhea and sneezing. Negative for ear pain and sore throat.   Respiratory: Negative.  Negative for cough and wheezing.   Cardiovascular: Negative.  Negative for palpitations.  Gastrointestinal: Negative.  Negative for diarrhea.  Endocrine: Negative.   Genitourinary: Negative.  Negative for bladder incontinence.  Musculoskeletal: Positive for arthritis, back pain and stiffness.  Neurological: Negative.  Negative for headaches.  Hematological: Negative.   Psychiatric/Behavioral: Negative for sleep disturbance. The patient has insomnia. The patient is not nervous/anxious.   All other systems reviewed and are negative.      Objective:   Physical Exam  Constitutional: He is oriented to person, place, and time. He appears well-developed and well-nourished. No distress.  HENT:  Head: Normocephalic.  Right Ear: External ear normal.  Left Ear: External ear normal.  Nose: Nose normal.  Mouth/Throat: Oropharynx is clear and moist.  Eyes: Pupils are equal, round, and reactive to light. Right eye exhibits no discharge. Left eye exhibits no discharge.  Neck: Normal range of motion. Neck supple. No thyromegaly present.  Cardiovascular: Normal rate, regular rhythm, normal heart sounds and intact distal pulses.   No murmur heard. Pulmonary/Chest: Effort normal and breath sounds normal. No respiratory distress. He has no  wheezes.  Abdominal: Soft. Bowel sounds are normal. He exhibits no distension. There is no tenderness.  Musculoskeletal: Normal range of motion. He exhibits no edema or tenderness.  Neurological: He is alert and oriented to person, place, and time.  Skin: Skin is warm and dry. No rash noted. No erythema.  Psychiatric: He has a normal mood and affect. His behavior is normal. Judgment and thought content normal.  Vitals reviewed.   Pain assessment: Cause of pain-  Pain location- Chronic back pain and arthritis  Pain on scale of 1-10- 6 Frequency-Constantly What increases pain-Standing and walking What makes pain Better-rest and pain medication  Current medications- Norco 5-325 mg every 6 prn, #30 a month Effectiveness of current meds-stable   Urine drug screen- No [place] Yes, 02[place]06/16  If yes were their any concerning findings? - Only received controlled medication from me   BP 115/74   Pulse 76   Temp 97.5 F (36.4 C) (Oral)   Ht _0  (1.803 m)   Wt 234 lb 3.2 oz (106.2 kg)   BMI 32.66 kg/m      Assessment & Plan:  1. Essential hypertension - CMP14+EGFR  2. Chronic asthma without complication, unspecified asthma severity, unspecified whether persistent - CMP14+EGFR  3. Hypothyroidism, unspecified type - CMP14+EGFR - Thyroid Panel With TSH  4. Other polyneuropathy ([place] - CMP14+EGFR  5. Arthritis - CMP14+EGFR - HYDROcodone-acetaminophen (NORCO) 5-325 MG tablet; Take 1 tablet by mouth every 6 (six) hours as needed for moderate pain.  Dispense: 30 tablet; Refill: 0 - HYDROcodone-acetaminophen (NORCO/VICODIN) 5-325 MG tablet; Take 1 tablet by mouth every 6 (six) hours as needed for moderate pain.  Dispense: 30 tablet; Refill:  0 - HYDROcodone-acetaminophen (NORCO/VICODIN) 5-325 MG tablet; Take 1 tablet by mouth every 6 (six) hours as needed for moderate pain.  Dispense: 30 tablet; Refill: 0  6. Chronic bilateral low back pain, with sciatica  presence unspecified - CMP14+EGFR - HYDROcodone-acetaminophen (NORCO) 5-325 MG tablet; Take 1 tablet by mouth every 6 (six) hours as needed for moderate pain.  Dispense: 30 tablet; Refill: 0 - HYDROcodone-acetaminophen (NORCO/VICODIN) 5-325 MG tablet; Take 1 tablet by mouth every 6 (six) hours as needed for moderate pain.  Dispense: 30 tablet; Refill: 0 - HYDROcodone-acetaminophen (NORCO/VICODIN) 5-325 MG tablet; Take 1 tablet by mouth every 6 (six) hours as needed for moderate pain.  Dispense: 30 tablet; Refill: 0  7. Pain medication agreement signed - CMP14+EGFR - HYDROcodone-acetaminophen (NORCO) 5-325 MG tablet; Take 1 tablet by mouth every 6 (six) hours as needed for moderate pain.  Dispense: 30 tablet; Refill: 0 - HYDROcodone-acetaminophen (NORCO/VICODIN) 5-325 MG tablet; Take 1 tablet by mouth every 6 (six) hours as needed for moderate pain.  Dispense: 30 tablet; Refill: 0 - HYDROcodone-acetaminophen (NORCO/VICODIN) 5-325 MG tablet; Take 1 tablet by mouth every 6 (six) hours as needed for moderate pain.  Dispense: 30 tablet; Refill: 0  8. Uncomplicated opioid dependence (HCC) - CMP14+EGFR - HYDROcodone-acetaminophen (NORCO) 5-325 MG tablet; Take 1 tablet by mouth every 6 (six) hours as needed for moderate pain.  Dispense: 30 tablet; Refill: 0 - HYDROcodone-acetaminophen (NORCO/VICODIN) 5-325 MG tablet; Take 1 tablet by mouth every 6 (six) hours as needed for moderate pain.  Dispense: 30 tablet; Refill: 0 - HYDROcodone-acetaminophen (NORCO/VICODIN) 5-325 MG tablet; Take 1 tablet by mouth every 6 (six) hours as needed for moderate pain.  Dispense: 30 tablet; Refill: 0  9. Obesity (BMI 30-39.9) - CMP14+EGFR  10. Insomnia, unspecified type - CMP14+EGFR  11. Hyperlipidemia, unspecified hyperlipidemia type - CMP14+EGFR - Lipid panel  12. Chronic gout without tophus, unspecified cause, unspecified site - Uric acid  13. Chronic bilateral low back pain without sciatica -  HYDROcodone-acetaminophen (NORCO) 5-325 MG tablet; Take 1 tablet by mouth every 6 (six) hours as needed for moderate pain.  Dispense: 30 tablet; Refill: 0 - HYDROcodone-acetaminophen (NORCO/VICODIN) 5-325 MG tablet; Take 1 tablet by mouth every 6 (six) hours as needed for moderate pain.  Dispense: 30 tablet; Refill: 0 - HYDROcodone-acetaminophen (NORCO/VICODIN) 5-325 MG tablet; Take 1 tablet by mouth every 6 (six) hours as needed for moderate pain.  Dispense: 30 tablet; Refill: 0   Continue all meds Labs pending Health Maintenance reviewed Diet and exercise encouraged RTO 3 months   Evelina Dun, FNP

## 2016-08-16 NOTE — Patient Instructions (Signed)
Arthritis Introduction Arthritis is a term that is commonly used to refer to joint pain or joint disease. There are more than 100 types of arthritis. What are the causes? The most common cause of this condition is wear and tear of a joint. Other causes include:  Gout.  Inflammation of a joint.  An infection of a joint.  Sprains and other injuries near the joint.  A drug reaction or allergic reaction. In some cases, the cause may not be known. What are the signs or symptoms? The main symptom of this condition is pain in the joint with movement. Other symptoms include:  Redness, swelling, or stiffness at a joint.  Warmth coming from the joint.  Fever.  Overall feeling of illness. How is this diagnosed? This condition may be diagnosed with a physical exam and tests, including:  Blood tests.  Urine tests.  Imaging tests, such as MRI, X-rays, or a CT scan. Sometimes, fluid is removed from a joint for testing. How is this treated? Treatment for this condition may involve:  Treatment of the cause, if it is known.  Rest.  Raising (elevating) the joint.  Applying cold or hot packs to the joint.  Medicines to improve symptoms and reduce inflammation.  Injections of a steroid such as cortisone into the joint to help reduce pain and inflammation. Depending on the cause of your arthritis, you may need to make lifestyle changes to reduce stress on your joint. These changes may include exercising more and losing weight. Follow these instructions at home: Medicines  Take over-the-counter and prescription medicines only as told by your health care provider.  Do not take aspirin to relieve pain if gout is suspected. Activity  Rest your joint if told by your health care provider. Rest is important when your disease is active and your joint feels painful, swollen, or stiff.  Avoid activities that make the pain worse. It is important to balance activity with rest.  Exercise  your joint regularly with range-of-motion exercises as told by your health care provider. Try doing low-impact exercise, such as:  Swimming.  Water aerobics.  Biking.  Walking. Joint Care   If your joint is swollen, keep it elevated if told by your health care provider.  If your joint feels stiff in the morning, try taking a warm shower.  If directed, apply heat to the joint. If you have diabetes, do not apply heat without permission from your health care provider.  Put a towel between the joint and the hot pack or heating pad.  Leave the heat on the area for 20-30 minutes.  If directed, apply ice to the joint:  Put ice in a plastic bag.  Place a towel between your skin and the bag.  Leave the ice on for 20 minutes, 2-3 times per day.  Keep all follow-up visits as told by your health care provider. This is important. Contact a health care provider if:  The pain gets worse.  You have a fever. Get help right away if:  You develop severe joint pain, swelling, or redness.  Many joints become painful and swollen.  You develop severe back pain.  You develop severe weakness in your leg.  You cannot control your bladder or bowels. This information is not intended to replace advice given to you by your health care provider. Make sure you discuss any questions you have with your health care provider. Document Released: 07/15/2004 Document Revised: 11/13/2015 Document Reviewed: 09/02/2014  2017 [employer]  

## 2016-08-17 ENCOUNTER — Other Ambulatory Visit: Payer: Self-pay | Admitting: Family

## 2016-08-17 ENCOUNTER — Other Ambulatory Visit: Payer: Self-pay | Admitting: *Deleted

## 2016-08-17 DIAGNOSIS — M109 Gout, unspecified: Secondary | ICD-10-CM

## 2016-08-17 LAB — LIPID PANEL
CHOLESTEROL TOTAL: 172 mg/dL (ref 100–199)
Chol/HDL Ratio: 4.4 ratio units (ref 0.0–5.0)
HDL: 39 mg/dL — AB (ref >39)
LDL CALC: 110 mg/dL — AB (ref 0–99)
Triglycerides: 116 mg/dL (ref 0–149)
VLDL CHOLESTEROL CAL: 23 mg/dL (ref 5–40)

## 2016-08-17 LAB — CMP14+EGFR
ALBUMIN: 4.1 g/dL (ref 3.6–4.8)
ALK PHOS: 108 IU/L (ref 39–117)
ALT: 21 IU/L (ref 0–44)
AST: 25 IU/L (ref 0–40)
Albumin/Globulin Ratio: 1.4 (ref 1.2–2.2)
BUN / CREAT RATIO: 9 — AB (ref 10–24)
BUN: 13 mg/dL (ref 8–27)
Bilirubin Total: 0.7 mg/dL (ref 0.0–1.2)
CO2: 22 mmol/L (ref 18–29)
CREATININE: 1.48 mg/dL — AB (ref 0.76–1.27)
Calcium: 9.2 mg/dL (ref 8.6–10.2)
Chloride: 100 mmol/L (ref 96–106)
GFR calc non Af Amer: 48 mL/min/{1.73_m2} — ABNORMAL LOW (ref >59)
GFR, EST AFRICAN AMERICAN: 55 mL/min/{1.73_m2} — AB (ref >59)
GLUCOSE: 97 mg/dL (ref 65–99)
Globulin, Total: 3 g/dL (ref 1.5–4.5)
Potassium: 4.4 mmol/L (ref 3.5–5.2)
Sodium: 137 mmol/L (ref 134–144)
TOTAL PROTEIN: 7.1 g/dL (ref 6.0–8.5)

## 2016-08-17 LAB — URIC ACID: Uric Acid: 7.8 mg/dL (ref 3.7–8.6)

## 2016-08-17 LAB — THYROID PANEL WITH TSH
Free Thyroxine Index: 2.5 (ref 1.2–4.9)
T3 Uptake Ratio: 32 % (ref 24–39)
T4, Total: 7.7 ug/dL (ref 4.5–12.0)
TSH: 3.69 u[IU]/mL (ref 0.450–4.500)

## 2016-08-17 MED ORDER — COLCHICINE 0.6 MG PO TABS: ORAL_TABLET | ORAL | 3 refills | Status: DC

## 2016-08-17 MED ORDER — SIMVASTATIN 20 MG PO TABS: 20.0000 mg | ORAL_TABLET | Freq: Every day | ORAL | 1 refills | Status: DC

## 2016-08-17 MED ORDER — ALLOPURINOL 300 MG PO TABS: 300.0000 mg | ORAL_TABLET | Freq: Every day | ORAL | 1 refills | Status: DC

## 2016-08-17 NOTE — Progress Notes (Signed)
LFTs normal. Will await ultrasound.

## 2016-08-23 ENCOUNTER — Ambulatory Visit (HOSPITAL_COMMUNITY)
Admission: RE | Admit: 2016-08-23 | Discharge: 2016-08-23 | Disposition: A | Discharge status: Home / Self Care | Payer: Medicare Other | Source: Ambulatory Visit | Attending: Gastroenterology | Admitting: Gastroenterology

## 2016-08-23 DIAGNOSIS — R17 Unspecified jaundice: Secondary | ICD-10-CM | POA: Diagnosis not present

## 2016-08-30 NOTE — Progress Notes (Signed)
Most recent LFTs normal. Ultrasound with fatty liver. Recommend avoiding ETOH. Recheck HFP in 3 months. When he is ready to pursue colonoscopy, let us know.

## 2016-09-29 ENCOUNTER — Other Ambulatory Visit: Payer: Self-pay | Admitting: *Deleted

## 2016-09-29 DIAGNOSIS — J3089 Other allergic rhinitis: Secondary | ICD-10-CM

## 2016-09-29 MED ORDER — LEVOTHYROXINE SODIUM 88 MCG PO TABS: 88.0000 ug | ORAL_TABLET | Freq: Every day | ORAL | 2 refills | Status: DC

## 2016-09-29 MED ORDER — MONTELUKAST SODIUM 10 MG PO TABS: 10.0000 mg | ORAL_TABLET | Freq: Every day | ORAL | 2 refills | Status: DC

## 2016-09-29 MED ORDER — FLUTICASONE PROPIONATE 50 MCG/ACT NA SUSP: 2.0000 | Freq: Every day | NASAL | 5 refills | Status: DC

## 2016-09-29 MED ORDER — CARBIDOPA-LEVODOPA ER 50-200 MG PO TBCR: 1.0000 | EXTENDED_RELEASE_TABLET | Freq: Two times a day (BID) | ORAL | 1 refills | Status: DC

## 2016-09-30 ENCOUNTER — Encounter: Payer: Self-pay | Admitting: Family

## 2016-09-30 ENCOUNTER — Ambulatory Visit (INDEPENDENT_AMBULATORY_CARE_PROVIDER_SITE_OTHER): Payer: Medicare Other | Admitting: Family

## 2016-09-30 ENCOUNTER — Ambulatory Visit: Payer: Medicare Other | Admitting: Family

## 2016-09-30 VITALS — BP 94/64 | HR 82 | Temp 98.1°F | Ht 71.0 in | Wt 231.8 lb

## 2016-09-30 DIAGNOSIS — I10 Essential (primary) hypertension: Secondary | ICD-10-CM

## 2016-09-30 DIAGNOSIS — M1990 Unspecified osteoarthritis, unspecified site: Secondary | ICD-10-CM

## 2016-09-30 DIAGNOSIS — M25561 Pain in right knee: Secondary | ICD-10-CM

## 2016-09-30 DIAGNOSIS — M1711 Unilateral primary osteoarthritis, right knee: Secondary | ICD-10-CM

## 2016-09-30 DIAGNOSIS — I952 Hypotension due to drugs: Secondary | ICD-10-CM | POA: Diagnosis not present

## 2016-09-30 DIAGNOSIS — I959 Hypotension, unspecified: Secondary | ICD-10-CM

## 2016-09-30 MED ORDER — BUPIVACAINE HCL 0.25 % IJ SOLN
1.0000 mL | Freq: Once | INTRAMUSCULAR | Status: AC
  Administered 2016-09-30: 1 mL via INTRA_ARTICULAR

## 2016-09-30 MED ORDER — METHYLPREDNISOLONE ACETATE 40 MG/ML IJ SUSP
40.0000 mg | Freq: Once | INTRAMUSCULAR | Status: AC
  Administered 2016-09-30: 40 mg via INTRA_ARTICULAR

## 2016-09-30 NOTE — Patient Instructions (Signed)
Knee Pain, Adult Knee pain in adults is common. It can be caused by many things, including:  Arthritis.  A fluid-filled sac (cyst) or growth in your knee.  An infection in your knee.  An injury that will not heal.  Damage, swelling, or irritation of the tissues that support your knee. Knee pain is usually not a sign of a serious problem. The pain may go away on its own with time and rest. If it does not, a health care provider may order tests to find the cause of the pain. These may include:  Imaging tests, such as an X-ray, MRI, or ultrasound.  Joint aspiration. In this test, fluid is removed from the knee.  Arthroscopy. In this test, a lighted tube is inserted into knee and an image is projected onto a TV screen.  A biopsy. In this test, a sample of tissue is removed from the body and studied under a microscope. Follow these instructions at home: Pay attention to any changes in your symptoms. Take these actions to relieve your pain. Activity   Rest your knee.  Do not do things that cause pain or make pain worse.  Avoid high-impact activities or exercises, such as running, jumping rope, or doing jumping jacks. General instructions   Take over-the-counter and prescription medicines only as told by your health care provider.  Raise (elevate) your knee above the level of your heart when you are sitting or lying down.  Sleep with a pillow under your knee.  If directed, apply ice to the knee:  Put ice in a plastic bag.  Place a towel between your skin and the bag.  Leave the ice on for 20 minutes, 2-3 times a day.  Ask your health care provider if you should wear an elastic knee support.  Lose weight if you are overweight. Extra weight can put pressure on your knee.  Do not use any products that contain nicotine or tobacco, such as cigarettes and e-cigarettes. Smoking may slow the healing of any bone and joint problems that you may have. If you need help quitting, ask  your health care provider. Contact a health care provider if:  Your knee pain continues, changes, or gets worse.  You have a fever along with knee pain.  Your knee buckles or locks up.  Your knee swells, and the swelling becomes worse. Get help right away if:  Your knee feels warm to the touch.  You cannot move your knee.  You have severe pain in your knee.  You have chest pain.  You have trouble breathing. Summary  Knee pain in adults is common. It can be caused by many things, including, arthritis, infection, cysts, or injury.  Knee pain is usually not a sign of a serious problem, but if it does not go away, a health care provider may perform tests to know the cause of the pain.  Pay attention to any changes in your symptoms. Relieve your pain with rest, medicines, light activity, and use of ice.  Get help if your pain continues or becomes very severe, or if your knee buckles or locks up, or if you have chest pain or trouble breathing. This information is not intended to replace advice given to you by your health care provider. Make sure you discuss any questions you have with your health care provider. Document Released: 04/04/2007 Document Revised: 05/28/2016 Document Reviewed: 05/28/2016 Elsevier Interactive Patient Education  2017 [employer].

## 2016-09-30 NOTE — Progress Notes (Signed)
   Subjective:    Patient ID: Braulio Conte, male    DOB: Dec 28, 1945, 71 y.o.   MRN: [id number]  Pt presents to the office today for recurrent knee pain. Pt requesting ortho referral. Pt's BP is low today. Denies any dizziness. Knee Pain   The incident occurred more than 1 week ago. The pain is present in the right knee. The quality of the pain is described as aching. The pain is at a severity of 10/10. The pain is severe. The pain has been worsening since onset. Pertinent negatives include no inability to bear weight. The symptoms are aggravated by movement. He has tried NSAIDs and non-weight bearing for the symptoms. The treatment provided moderate relief.      Review of Systems  Musculoskeletal: Positive for arthralgias and joint swelling.  All other systems reviewed and are negative.      Objective:   Physical Exam  Constitutional: He is oriented to person, place, and time. He appears well-developed and well-nourished. No distress.  HENT:  Head: Normocephalic.  Cardiovascular: Normal rate, regular rhythm, normal heart sounds and intact distal pulses.   No murmur heard. Pulmonary/Chest: Effort normal and breath sounds normal. No respiratory distress. He has no wheezes.  Abdominal: Soft. Bowel sounds are normal. He exhibits no distension. There is no tenderness.  Musculoskeletal: Normal range of motion. He exhibits edema and tenderness.  Pain in right knee with flexion or extension, trace edema present  Neurological: He is alert and oriented to person, place, and time.  Skin: Skin is warm and dry. No rash noted. No erythema.  Psychiatric: He has a normal mood and affect. His behavior is normal. Judgment and thought content normal.  Vitals reviewed.   Right knee prepped with betadine Injected with Marcaine .5% plain and methylprednisolone with 25 guage needle x 1. Patient tolerated well.   BP 94/64   Pulse 82   Temp 98.1 F (36.7 C) (Oral)   Ht 5\' 11"  (1.803 m)    Wt 231 lb 12.8 oz (105.1 kg)   BMI 32.33 kg/m      Assessment & Plan:  1. Essential hypertension -Pt's lisinopril stopped today related to hypotension  2. Arthritis Rest  Ice ROM exercises encouraged - Ambulatory referral to Orthopedic Surgery - bupivacaine (MARCAINE) 0.25 % (with pres) injection 1 mL; Inject 1 mL into the articular space once. - methylPREDNISolone acetate (DEPO-MEDROL) injection 40 mg; Inject 1 mL (40 mg total) into the articular space once.  3. Primary osteoarthritis of right knee Rest  Ice ROM exercises encouraged - Ambulatory referral to Orthopedic Surgery - bupivacaine (MARCAINE) 0.25 % (with pres) injection 1 mL; Inject 1 mL into the articular space once. - methylPREDNISolone acetate (DEPO-MEDROL) injection 40 mg; Inject 1 mL (40 mg total) into the articular space once.  4. Hypotension, unspecified hypotension type -Pt's lisinopril stopped today related to hypotension  Evelina Dun, FNP

## 2016-10-07 ENCOUNTER — Ambulatory Visit (INDEPENDENT_AMBULATORY_CARE_PROVIDER_SITE_OTHER): Payer: Medicare Other | Admitting: Orthopaedic Surgery

## 2016-10-07 ENCOUNTER — Encounter (INDEPENDENT_AMBULATORY_CARE_PROVIDER_SITE_OTHER): Payer: Self-pay | Admitting: Orthopaedic Surgery

## 2016-10-07 ENCOUNTER — Ambulatory Visit (INDEPENDENT_AMBULATORY_CARE_PROVIDER_SITE_OTHER): Payer: Medicare Other

## 2016-10-07 VITALS — BP 131/76 | HR 89 | Ht 71.0 in | Wt 231.0 lb

## 2016-10-07 DIAGNOSIS — M25561 Pain in right knee: Secondary | ICD-10-CM

## 2016-10-07 DIAGNOSIS — G8929 Other chronic pain: Secondary | ICD-10-CM

## 2016-10-07 DIAGNOSIS — M1711 Unilateral primary osteoarthritis, right knee: Secondary | ICD-10-CM

## 2016-10-07 NOTE — Progress Notes (Signed)
Office Visit Note   Patient: Andrew Phelps           Date of Birth: 06-Sep-1945           MRN: [id number] Visit Date: 10/07/2016              Requested by: Sharion Balloon, FNP [address] [place],  [id number] PCP: Evelina Dun, FNP   Assessment & Plan: Visit Diagnoses:  1. Chronic pain of right knee   2. Unilateral primary osteoarthritis, right knee     Plan: Patient is ready proceed with total knee arthroplasty he's been having to take narcotic pain medication to control the pain. He's had day anti-inflammatories intra-articular cortisone injection without relief. It bothers him activities of daily living as well as not being able to participate in fishing and hunting. We discussed total knee arthroplasty. I've done his brother's knee is ready to proceed. Questions were elicited and answered.  Follow-Up Instructions: No Follow-up on file.   Orders:  Orders Placed This Encounter  Procedures  . XR KNEE 3 VIEW RIGHT   No orders of the defined types were placed in this encounter.     Procedures: No procedures performed   Clinical Data: No additional findings.   Subjective: Chief Complaint  Patient presents with  . Right Knee - Pain    HPI Asians had problems with his knees for greater than 10 years. 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 He's had anti-inflammatories has to take hydrocodone for the pain. He said gap is been on allopurinol for several years 300 mg daily. He's had previous cortisone injection without relief. Pain is progressed and he was referred here for total knee arthroplasty of the right knee. He has only mild problems with his opposite left knee. Patient was a previous smoker quit 20 years ago. He has pain with community ambulation pain that bothers him at night sometimes his knee catches. He is not falling from the knee.  Review of Systems  Constitutional: Negative for chills and diaphoresis.  HENT: Negative for  ear discharge, ear pain and nosebleeds.   Eyes: Negative for discharge and visual disturbance.  Respiratory: Negative for cough, choking and shortness of breath.        Past smoker quit 20 years ago. Past history of pneumonia. He also has asthma.  Cardiovascular: Negative for chest pain and palpitations.  Gastrointestinal: Negative for abdominal distention and abdominal pain.  Endocrine: Negative for cold intolerance and heat intolerance.  Genitourinary: Negative for flank pain and hematuria.  Musculoskeletal:       Positive for right knee pain failed anti-inflammatories. He is taking Vicodin for the pain he said intra-articular injections.  Skin: Negative for rash and wound.  Neurological: Negative for seizures and speech difficulty.  Hematological: Negative for adenopathy. Does not bruise/bleed easily.  Psychiatric/Behavioral: Negative for agitation and suicidal ideas.     Objective: Vital Signs: BP 131/76   Pulse 89   Ht 5\' 11"  (1.803 m)   Wt 231 lb (104.8 kg)   BMI 32.22 kg/m   Physical Exam  Constitutional: He is oriented to person, place, and time. He appears well-developed and well-nourished.  HENT:  Head: Normocephalic and atraumatic.  Eyes: EOM are normal. Pupils are equal, round, and reactive to light.  Neck: No tracheal deviation present. No thyromegaly present.  Cardiovascular: Normal rate.   Pulmonary/Chest: Effort normal. He has no wheezes.  Abdominal: Soft. Bowel sounds are normal.  Musculoskeletal:  Patient has a mild varus deformity right knee. He can only flex to 90. He lacks 5 reaching full extension collateral ligaments are balanced or so 2+ knee effusion tenderness medial lateral joint line significant crepitus with knee range of motion. He has trace edema in lower extremities symmetrical. Distal pulses posterior tibial and dorsalis pedis are 2+ and symmetrical. No pain with hip range of motion straight leg raising 90 negative popped a compression test.  Opposite knee shows mild crepitus with extension no significant synovitis by ligaments are stable and the knee is straight without varus deformity. No pain with hip range of motion on the left side.  Neurological: He is alert and oriented to person, place, and time.  Skin: Skin is warm and dry. Capillary refill takes less than 2 seconds.  Psychiatric: He has a normal mood and affect. His behavior is normal. Judgment and thought content normal.    Ortho Exam  Specialty Comments:  No specialty comments available.  Imaging: Xr Knee 3 View Right  Result Date: 10/07/2016 Three-view x-rays right knee demonstrates bone-on-bone changes medial compartment and marginal ossified subchondral sclerosis flattening of the medial femoral condyle and 10 varus deformity with knee effusion. Impression: End-stage osteoarthritis right knee. Tricompartmental degenerative changes.    PMFS History: Patient Active Problem List   Diagnosis Date Noted  . Unilateral primary osteoarthritis, right knee 10/07/2016  . Elevated bilirubin 08/12/2016  . Pain medication agreement signed 02/06/2016  . Opioid dependence ([place]) 02/06/2016  . Obesity (BMI 30-39.9) 01/02/2016  . Peripheral neuropathy 01/02/2016  . Chronic back pain 01/02/2016  . Essential hypertension 12/11/2014  . Hypothyroidism 12/11/2014  . Allergic rhinitis 12/11/2014  . Insomnia 12/11/2014  . Arthritis 12/11/2014  . Hyperlipidemia 12/11/2014  . Asthma, chronic 12/11/2014   Past Medical History:  Diagnosis Date  . Arthritis   . Back pain   . Gout   . Hypertension   . Insomnia   . Joint pain   . Thyroid disease     Family History  Problem Relation Age of Onset  . Heart disease Mother   . Heart disease Father   . Hypertension Brother   . Hyperlipidemia Brother   . Colon cancer Neg Hx   . Liver disease Neg Hx     Past Surgical History:  Procedure Laterality Date  . none     Social History   Occupational History  . Not on file.     Social History Main Topics  . Smoking status: Former Smoker    Quit date: 07/06/1995  . Smokeless tobacco: Current User    Types: Chew  . Alcohol use Yes     Comment: history of heavy ETOH use, states quit drinking as of 08/12/16 visit   . Drug use: No  . Sexual activity: Not on file

## 2016-10-22 ENCOUNTER — Ambulatory Visit: Payer: Medicare Other

## 2016-11-05 ENCOUNTER — Encounter: Payer: Self-pay | Admitting: *Deleted

## 2016-11-18 ENCOUNTER — Ambulatory Visit (INDEPENDENT_AMBULATORY_CARE_PROVIDER_SITE_OTHER): Payer: Medicare Other | Admitting: Family

## 2016-11-18 ENCOUNTER — Encounter: Payer: Self-pay | Admitting: Family

## 2016-11-18 VITALS — BP 136/81 | HR 92 | Temp 97.8°F | Ht 71.0 in | Wt 228.8 lb

## 2016-11-18 DIAGNOSIS — E785 Hyperlipidemia, unspecified: Secondary | ICD-10-CM

## 2016-11-18 DIAGNOSIS — J45909 Unspecified asthma, uncomplicated: Secondary | ICD-10-CM

## 2016-11-18 DIAGNOSIS — E039 Hypothyroidism, unspecified: Secondary | ICD-10-CM

## 2016-11-18 DIAGNOSIS — M1A9XX0 Chronic gout, unspecified, without tophus (tophi): Secondary | ICD-10-CM

## 2016-11-18 DIAGNOSIS — I10 Essential (primary) hypertension: Secondary | ICD-10-CM | POA: Diagnosis not present

## 2016-11-18 DIAGNOSIS — F1120 Opioid dependence, uncomplicated: Secondary | ICD-10-CM

## 2016-11-18 DIAGNOSIS — Z0289 Encounter for other administrative examinations: Secondary | ICD-10-CM | POA: Diagnosis not present

## 2016-11-18 DIAGNOSIS — G4700 Insomnia, unspecified: Secondary | ICD-10-CM

## 2016-11-18 DIAGNOSIS — E669 Obesity, unspecified: Secondary | ICD-10-CM

## 2016-11-18 DIAGNOSIS — G8929 Other chronic pain: Secondary | ICD-10-CM | POA: Diagnosis not present

## 2016-11-18 DIAGNOSIS — M1990 Unspecified osteoarthritis, unspecified site: Secondary | ICD-10-CM | POA: Diagnosis not present

## 2016-11-18 DIAGNOSIS — Z23 Encounter for immunization: Secondary | ICD-10-CM | POA: Diagnosis not present

## 2016-11-18 DIAGNOSIS — M109 Gout, unspecified: Secondary | ICD-10-CM | POA: Insufficient documentation

## 2016-11-18 DIAGNOSIS — M545 Low back pain, unspecified: Secondary | ICD-10-CM

## 2016-11-18 MED ORDER — HYDROCODONE-ACETAMINOPHEN 5-325 MG PO TABS: 1.0000 | ORAL_TABLET | Freq: Four times a day (QID) | ORAL | 0 refills | Status: DC | PRN

## 2016-11-18 NOTE — Addendum Note (Signed)
Addended by: Shelbie Ammons on: 11/18/2016 10:56 AM   Modules accepted: Orders

## 2016-11-18 NOTE — Progress Notes (Signed)
Subjective:    Patient ID: Andrew Phelps, male    DOB: 09-Apr-1946, 71 y.o.   MRN: [id number]  Pt presents to the office today for chronic follow up and pain medication refill.  Hypertension  This is a chronic problem. The current episode started more than 1 year ago. The problem has been resolved since onset. The problem is controlled. Pertinent negatives include no headaches, peripheral edema or shortness of breath. The current treatment provides moderate improvement. There is no history of kidney disease or heart failure. Identifiable causes of hypertension include a thyroid problem.  Thyroid Problem  Presents for follow-up visit. Patient reports no constipation, diarrhea or fatigue. The symptoms have been stable. His past medical history is significant for hyperlipidemia. There is no history of heart failure.  Arthritis  Presents for follow-up visit. He complains of pain and stiffness. Affected locations include the right knee and left knee (back). His pain is at a severity of 6/10. Pertinent negatives include no diarrhea or fatigue.  Hyperlipidemia  This is a chronic problem. The current episode started more than 1 year ago. The problem is uncontrolled. Recent lipid tests were reviewed and are high. Exacerbating diseases include obesity. Pertinent negatives include no shortness of breath. Current antihyperlipidemic treatment includes statins. The current treatment provides moderate improvement of lipids. Risk factors for coronary artery disease include hypertension, obesity and dyslipidemia.  Insomnia  Primary symptoms: difficulty falling asleep.  The current episode started more than one year. The problem occurs nightly. The problem has been resolved since onset.  Back Pain  This is a chronic problem. The current episode started more than 1 year ago. The problem has been waxing and waning since onset. The pain is at a severity of 6/10. The pain is moderate. Pertinent negatives  include no headaches.  Asthma  There is no cough, shortness of breath or wheezing. This is a chronic problem. The current episode started more than 1 year ago. The problem has been waxing and waning. Pertinent negatives include no headaches. His past medical history is significant for asthma.  Gout Pt has not been taking his allopurinol, but has taken his colchicine every day. States he feels like his "gout is better".     Review of Systems  Constitutional: Negative for fatigue.  Respiratory: Negative for cough, shortness of breath and wheezing.   Gastrointestinal: Negative for constipation and diarrhea.  Musculoskeletal: Positive for arthritis, back pain and stiffness.  Neurological: Negative for headaches.  Psychiatric/Behavioral: The patient has insomnia.   All other systems reviewed and are negative.      Objective:   Physical Exam  Constitutional: He is oriented to person, place, and time. He appears well-developed and well-nourished. No distress.  HENT:  Head: Normocephalic.  Right Ear: External ear normal.  Left Ear: External ear normal.  Mouth/Throat: Oropharynx is clear and moist.  Eyes: Pupils are equal, round, and reactive to light. Right eye exhibits no discharge. Left eye exhibits no discharge.  Neck: Normal range of motion. Neck supple. No thyromegaly present.  Cardiovascular: Normal rate, regular rhythm, normal heart sounds and intact distal pulses.   No murmur heard. Pulmonary/Chest: Effort normal and breath sounds normal. No respiratory distress. He has no wheezes.  Abdominal: Soft. Bowel sounds are normal. He exhibits no distension. There is no tenderness.  Musculoskeletal: Normal range of motion. He exhibits no edema or tenderness.  Neurological: He is alert and oriented to person, place, and time. He has normal reflexes. No cranial nerve deficit.  Skin: Skin is warm and dry. No rash noted. No erythema.  Psychiatric: He has a normal mood and affect. His behavior  is normal. Judgment and thought content normal.  Vitals reviewed.     BP 136/81   Pulse 92   Temp 97.8 F (36.6 C) (Oral)   Ht 5\' 11"  (1.803 m)   Wt 228 lb 12.8 oz (103.8 kg)   BMI 31.91 kg/m      Assessment & Plan:  1. Essential hypertension - CMP14+EGFR  2. Chronic asthma without complication, unspecified asthma severity, unspecified whether persistent - CMP14+EGFR  3. Hypothyroidism, unspecified type - CMP14+EGFR - Thyroid Panel With TSH  4. Arthritis - CMP14+EGFR - HYDROcodone-acetaminophen (NORCO) 5-325 MG tablet; Take 1 tablet by mouth every 6 (six) hours as needed for moderate pain.  Dispense: 60 tablet; Refill: 0 - HYDROcodone-acetaminophen (NORCO/VICODIN) 5-325 MG tablet; Take 1 tablet by mouth every 6 (six) hours as needed for moderate pain.  Dispense: 60 tablet; Refill: 0 - HYDROcodone-acetaminophen (NORCO/VICODIN) 5-325 MG tablet; Take 1 tablet by mouth every 6 (six) hours as needed for moderate pain.  Dispense: 60 tablet; Refill: 0  5. Pain medication agreement signed - CMP14+EGFR - HYDROcodone-acetaminophen (NORCO) 5-325 MG tablet; Take 1 tablet by mouth every 6 (six) hours as needed for moderate pain.  Dispense: 60 tablet; Refill: 0 - HYDROcodone-acetaminophen (NORCO/VICODIN) 5-325 MG tablet; Take 1 tablet by mouth every 6 (six) hours as needed for moderate pain.  Dispense: 60 tablet; Refill: 0 - HYDROcodone-acetaminophen (NORCO/VICODIN) 5-325 MG tablet; Take 1 tablet by mouth every 6 (six) hours as needed for moderate pain.  Dispense: 60 tablet; Refill: 0  6. Uncomplicated opioid dependence ([facility]) - CMP14+EGFR - HYDROcodone-acetaminophen (NORCO) 5-325 MG tablet; Take 1 tablet by mouth every 6 (six) hours as needed for moderate pain.  Dispense: 60 tablet; Refill: 0 - HYDROcodone-acetaminophen (NORCO/VICODIN) 5-325 MG tablet; Take 1 tablet by mouth every 6 (six) hours as needed for moderate pain.  Dispense: 60 tablet; Refill: 0 - HYDROcodone-acetaminophen  (NORCO/VICODIN) 5-325 MG tablet; Take 1 tablet by mouth every 6 (six) hours as needed for moderate pain.  Dispense: 60 tablet; Refill: 0  7. Obesity (BMI 30-39.9) - CMP14+EGFR  8. Insomnia, unspecified type - CMP14+EGFR  9. Hyperlipidemia, unspecified hyperlipidemia type - CMP14+EGFR - Lipid panel  10. Chronic bilateral low back pain, with sciatica presence unspecified - CMP14+EGFR - HYDROcodone-acetaminophen (NORCO) 5-325 MG tablet; Take 1 tablet by mouth every 6 (six) hours as needed for moderate pain.  Dispense: 60 tablet; Refill: 0 - HYDROcodone-acetaminophen (NORCO/VICODIN) 5-325 MG tablet; Take 1 tablet by mouth every 6 (six) hours as needed for moderate pain.  Dispense: 60 tablet; Refill: 0 - HYDROcodone-acetaminophen (NORCO/VICODIN) 5-325 MG tablet; Take 1 tablet by mouth every 6 (six) hours as needed for moderate pain.  Dispense: 60 tablet; Refill: 0  11. Chronic gout without tophus, unspecified cause, unspecified site - CMP14+EGFR - Uric acid  12. Chronic bilateral low back pain without sciatica - HYDROcodone-acetaminophen (NORCO) 5-325 MG tablet; Take 1 tablet by mouth every 6 (six) hours as needed for moderate pain.  Dispense: 60 tablet; Refill: 0 - HYDROcodone-acetaminophen (NORCO/VICODIN) 5-325 MG tablet; Take 1 tablet by mouth every 6 (six) hours as needed for moderate pain.  Dispense: 60 tablet; Refill: 0 - HYDROcodone-acetaminophen (NORCO/VICODIN) 5-325 MG tablet; Take 1 tablet by mouth every 6 (six) hours as needed for moderate pain.  Dispense: 60 tablet; Refill: 0  *Pt reviewed in [place] controlled database  Continue all meds Labs pending Health  Maintenance reviewed Diet and exercise encouraged RTO 3 months   Evelina Dun, FNP

## 2016-11-18 NOTE — Patient Instructions (Signed)
Joint Pain Joint pain can be caused by many things. The joint can be bruised, infected, weak from aging, or sore from exercise. The pain will probably go away if you follow your doctor's instructions for home care. If your joint pain continues, more tests may be needed to help find the cause of your condition. Follow these instructions at home: Watch your condition for any changes. Follow these instructions as told to lessen the pain that you are feeling:  Take medicines only as told by your doctor.  Rest the sore joint for as long as told by your doctor. If your doctor tells you to, raise (elevate) the painful joint above the level of your heart while you are sitting or lying down.  Do not do things that cause pain or make the pain worse.  If told, put ice on the painful area: ? Put ice in a plastic bag. ? Place a towel between your skin and the bag. ? Leave the ice on for 20 minutes, 2-3 times per day.  Wear an elastic bandage, splint, or sling as told by your doctor. Loosen the bandage or splint if your fingers or toes lose feeling (become numb) and tingle, or if they turn cold and blue.  Begin exercising or stretching the joint as told by your doctor. Ask your doctor what types of exercise are safe for you.  Keep all follow-up visits as told by your doctor. This is important.  Contact a doctor if:  Your pain gets worse and medicine does not help it.  Your joint pain does not get better in 3 days.  You have more bruising or swelling.  You have a fever.  You lose 10 pounds (4.5 kg) or more without trying. Get help right away if:  You are not able to move the joint.  Your fingers or toes become numb or they turn cold and blue. This information is not intended to replace advice given to you by your health care provider. Make sure you discuss any questions you have with your health care provider. Document Released: 05/26/2009 Document Revised: 11/13/2015 Document Reviewed:  03/19/2014 Elsevier Interactive Patient Education  2018 [employer]  

## 2016-11-19 ENCOUNTER — Ambulatory Visit: Payer: Medicare Other | Admitting: Family

## 2016-11-19 ENCOUNTER — Other Ambulatory Visit: Payer: Self-pay | Admitting: *Deleted

## 2016-11-19 LAB — LIPID PANEL
Chol/HDL Ratio: 2.8 ratio (ref 0.0–5.0)
Cholesterol, Total: 126 mg/dL (ref 100–199)
HDL: 45 mg/dL (ref >39)
LDL Calculated: 64 mg/dL (ref 0–99)
Triglycerides: 85 mg/dL (ref 0–149)
VLDL CHOLESTEROL CAL: 17 mg/dL (ref 5–40)

## 2016-11-19 LAB — CMP14+EGFR
ALT: 16 IU/L (ref 0–44)
AST: 28 IU/L (ref 0–40)
Albumin/Globulin Ratio: 1.5 (ref 1.2–2.2)
Albumin: 4 g/dL (ref 3.6–4.8)
Alkaline Phosphatase: 106 IU/L (ref 39–117)
BILIRUBIN TOTAL: 0.8 mg/dL (ref 0.0–1.2)
BUN/Creatinine Ratio: 4 — ABNORMAL LOW (ref 10–24)
BUN: 7 mg/dL — AB (ref 8–27)
CALCIUM: 9.4 mg/dL (ref 8.6–10.2)
CHLORIDE: 104 mmol/L (ref 96–106)
CO2: 24 mmol/L (ref 18–29)
Creatinine, Ser: 1.66 mg/dL — ABNORMAL HIGH (ref 0.76–1.27)
GFR, EST AFRICAN AMERICAN: 48 mL/min/{1.73_m2} — AB (ref >59)
GFR, EST NON AFRICAN AMERICAN: 41 mL/min/{1.73_m2} — AB (ref >59)
Globulin, Total: 2.6 g/dL (ref 1.5–4.5)
Glucose: 143 mg/dL — ABNORMAL HIGH (ref 65–99)
POTASSIUM: 4.3 mmol/L (ref 3.5–5.2)
Sodium: 142 mmol/L (ref 134–144)
TOTAL PROTEIN: 6.6 g/dL (ref 6.0–8.5)

## 2016-11-19 LAB — THYROID PANEL WITH TSH
FREE THYROXINE INDEX: 2.2 (ref 1.2–4.9)
T3 UPTAKE RATIO: 29 % (ref 24–39)
T4, Total: 7.5 ug/dL (ref 4.5–12.0)
TSH: 1.88 u[IU]/mL (ref 0.450–4.500)

## 2016-11-19 LAB — URIC ACID: URIC ACID: 10 mg/dL — AB (ref 3.7–8.6)

## 2016-11-19 MED ORDER — SIMVASTATIN 20 MG PO TABS: 20.0000 mg | ORAL_TABLET | Freq: Every day | ORAL | 1 refills | Status: DC

## 2016-11-22 ENCOUNTER — Other Ambulatory Visit: Payer: Self-pay | Admitting: Family

## 2016-11-22 MED ORDER — FEBUXOSTAT 40 MG PO TABS: 40.0000 mg | ORAL_TABLET | Freq: Every day | ORAL | 1 refills | Status: DC

## 2016-12-06 ENCOUNTER — Other Ambulatory Visit: Payer: Self-pay

## 2016-12-06 DIAGNOSIS — J302 Other seasonal allergic rhinitis: Secondary | ICD-10-CM

## 2016-12-06 MED ORDER — LORATADINE 10 MG PO TABS: 10.0000 mg | ORAL_TABLET | Freq: Every day | ORAL | 5 refills | Status: DC

## 2016-12-06 MED ORDER — CARBIDOPA-LEVODOPA ER 50-200 MG PO TBCR: 1.0000 | EXTENDED_RELEASE_TABLET | Freq: Two times a day (BID) | ORAL | 1 refills | Status: DC

## 2017-01-07 ENCOUNTER — Other Ambulatory Visit: Payer: Self-pay | Admitting: *Deleted

## 2017-01-07 DIAGNOSIS — G6289 Other specified polyneuropathies: Secondary | ICD-10-CM

## 2017-01-07 MED ORDER — GABAPENTIN 300 MG PO CAPS: 300.0000 mg | ORAL_CAPSULE | Freq: Three times a day (TID) | ORAL | 1 refills | Status: DC

## 2017-01-07 MED ORDER — CARBIDOPA-LEVODOPA ER 50-200 MG PO TBCR: 1.0000 | EXTENDED_RELEASE_TABLET | Freq: Two times a day (BID) | ORAL | 1 refills | Status: DC

## 2017-01-10 ENCOUNTER — Other Ambulatory Visit: Payer: Self-pay | Admitting: *Deleted

## 2017-01-10 MED ORDER — COLCHICINE 0.6 MG PO TABS: ORAL_TABLET | ORAL | 3 refills | Status: DC

## 2017-01-10 NOTE — Telephone Encounter (Signed)
Prescription sent to pharmacy.

## 2017-01-10 NOTE — Telephone Encounter (Signed)
[place]

## 2017-01-10 NOTE — Telephone Encounter (Signed)
Opened in error

## 2017-02-24 ENCOUNTER — Ambulatory Visit (INDEPENDENT_AMBULATORY_CARE_PROVIDER_SITE_OTHER): Payer: Medicare Other | Admitting: Family

## 2017-02-24 ENCOUNTER — Encounter: Payer: Self-pay | Admitting: Family

## 2017-02-24 VITALS — BP 139/80 | HR 72 | Temp 97.3°F | Ht 71.0 in | Wt 237.4 lb

## 2017-02-24 DIAGNOSIS — I10 Essential (primary) hypertension: Secondary | ICD-10-CM

## 2017-02-24 DIAGNOSIS — G6289 Other specified polyneuropathies: Secondary | ICD-10-CM

## 2017-02-24 DIAGNOSIS — F1120 Opioid dependence, uncomplicated: Secondary | ICD-10-CM | POA: Diagnosis not present

## 2017-02-24 DIAGNOSIS — M1A9XX0 Chronic gout, unspecified, without tophus (tophi): Secondary | ICD-10-CM

## 2017-02-24 DIAGNOSIS — R7309 Other abnormal glucose: Secondary | ICD-10-CM | POA: Diagnosis not present

## 2017-02-24 DIAGNOSIS — M1990 Unspecified osteoarthritis, unspecified site: Secondary | ICD-10-CM | POA: Diagnosis not present

## 2017-02-24 DIAGNOSIS — E669 Obesity, unspecified: Secondary | ICD-10-CM | POA: Diagnosis not present

## 2017-02-24 DIAGNOSIS — Z1211 Encounter for screening for malignant neoplasm of colon: Secondary | ICD-10-CM

## 2017-02-24 DIAGNOSIS — M545 Low back pain, unspecified: Secondary | ICD-10-CM

## 2017-02-24 DIAGNOSIS — E785 Hyperlipidemia, unspecified: Secondary | ICD-10-CM | POA: Diagnosis not present

## 2017-02-24 DIAGNOSIS — G4700 Insomnia, unspecified: Secondary | ICD-10-CM | POA: Diagnosis not present

## 2017-02-24 DIAGNOSIS — E039 Hypothyroidism, unspecified: Secondary | ICD-10-CM

## 2017-02-24 DIAGNOSIS — G8929 Other chronic pain: Secondary | ICD-10-CM

## 2017-02-24 DIAGNOSIS — Z0289 Encounter for other administrative examinations: Secondary | ICD-10-CM

## 2017-02-24 DIAGNOSIS — J45909 Unspecified asthma, uncomplicated: Secondary | ICD-10-CM

## 2017-02-24 LAB — BAYER DCA HB A1C WAIVED: HB A1C (BAYER DCA - WAIVED): 5.4 % (ref <7.0)

## 2017-02-24 MED ORDER — HYDROCODONE-ACETAMINOPHEN 5-325 MG PO TABS: 1.0000 | ORAL_TABLET | Freq: Four times a day (QID) | ORAL | 0 refills | Status: DC | PRN

## 2017-02-24 NOTE — Patient Instructions (Signed)

## 2017-02-24 NOTE — Progress Notes (Signed)
Subjective:    Patient ID: Andrew Phelps, male    DOB: [birth year], 71 y.o.   MRN: [id number]  Pt presents to the office today for chronic follow up and pain medication refill.  Hypertension  This is a chronic problem. The current episode started more than 1 year ago. The problem has been resolved since onset. The problem is controlled. Pertinent negatives include no headaches, malaise/fatigue, peripheral edema or shortness of breath. Risk factors for coronary artery disease include dyslipidemia, obesity and sedentary lifestyle. Past treatments include diuretics. The current treatment provides moderate improvement. There is no history of kidney disease, CAD/MI, CVA or heart failure. Identifiable causes of hypertension include a thyroid problem.  Thyroid Problem  Presents for follow-up visit. Symptoms include hoarse voice. Patient reports no anxiety, constipation or diarrhea. The symptoms have been stable. His past medical history is significant for hyperlipidemia. There is no history of heart failure.  Arthritis  Presents for follow-up visit. He complains of pain and stiffness. Affected locations include the right knee and left knee (back). His pain is at a severity of 5/10. Pertinent negatives include no diarrhea.  Back Pain  This is a chronic problem. The current episode started more than 1 year ago. The problem occurs constantly. The problem has been resolved since onset. The pain is present in the lumbar spine. The quality of the pain is described as aching. The pain is at a severity of 8/10. The pain is moderate. Pertinent negatives include no bladder incontinence, bowel incontinence or headaches.  Hyperlipidemia  This is a chronic problem. The current episode started more than 1 year ago. The problem is controlled. Recent lipid tests were reviewed and are normal. Exacerbating diseases include obesity. Pertinent negatives include no shortness of breath. Current antihyperlipidemic  treatment includes statins. The current treatment provides moderate improvement of lipids.  Asthma  He complains of hoarse voice. There is no cough, difficulty breathing or shortness of breath. This is a chronic problem. The current episode started more than 1 year ago. Pertinent negatives include no headaches, malaise/fatigue, rhinorrhea or sneezing. He reports moderate improvement on treatment. His past medical history is significant for asthma.  Peripheral Neuropathy  Has burning pain of bilateral feet that is constant. Pt taking gabapentin 300 mg TID that helps.  Gout Takes allopurinol  Daily. Stable.    Review of Systems  Constitutional: Negative for malaise/fatigue.  HENT: Positive for hoarse voice. Negative for rhinorrhea and sneezing.   Respiratory: Negative for cough and shortness of breath.   Gastrointestinal: Negative for bowel incontinence, constipation and diarrhea.  Genitourinary: Negative for bladder incontinence.  Musculoskeletal: Positive for arthritis, back pain and stiffness.  Neurological: Negative for headaches.  Psychiatric/Behavioral: The patient is not nervous/anxious.   All other systems reviewed and are negative.      Objective:   Physical Exam  Constitutional: He is oriented to person, place, and time. He appears well-developed and well-nourished. No distress.  HENT:  Head: Normocephalic.  Right Ear: External ear normal.  Left Ear: External ear normal.  Mouth/Throat: Oropharynx is clear and moist.  Eyes: Pupils are equal, round, and reactive to light. Right eye exhibits no discharge. Left eye exhibits no discharge.  Neck: Normal range of motion. Neck supple. No thyromegaly present.  Cardiovascular: Normal rate, regular rhythm, normal heart sounds and intact distal pulses.   No murmur heard. Pulmonary/Chest: Effort normal and breath sounds normal. No respiratory distress. He has no wheezes.  Abdominal: Soft. Bowel sounds are normal. He exhibits  no  distension. There is no tenderness.  Musculoskeletal: Normal range of motion. He exhibits no edema or tenderness.  Neurological: He is alert and oriented to person, place, and time.  Skin: Skin is warm and dry. No rash noted. No erythema.  Psychiatric: He has a normal mood and affect. His behavior is normal. Judgment and thought content normal.  Vitals reviewed.     BP 139/80   Pulse 72   Temp (!) 97.3 F (36.3 C) (Oral)   Ht 5' 11" (1.803 m)   Wt 237 lb 6.4 oz (107.7 kg)   BMI 33.11 kg/m      Assessment & Plan:  1. Hyperlipidemia, unspecified hyperlipidemia type - CMP14+EGFR - Lipid panel  2. Hypothyroidism, unspecified type - CMP14+EGFR - TSH  3. Arthritis - CMP14+EGFR - HYDROcodone-acetaminophen (NORCO) 5-325 MG tablet; Take 1 tablet by mouth every 6 (six) hours as needed for moderate pain.  Dispense: 60 tablet; Refill: 0 - HYDROcodone-acetaminophen (NORCO/VICODIN) 5-325 MG tablet; Take 1 tablet by mouth every 6 (six) hours as needed for moderate pain.  Dispense: 60 tablet; Refill: 0 - HYDROcodone-acetaminophen (NORCO/VICODIN) 5-325 MG tablet; Take 1 tablet by mouth every 6 (six) hours as needed for moderate pain.  Dispense: 60 tablet; Refill: 0  4. Chronic asthma without complication, unspecified asthma severity, unspecified whether persistent - CMP14+EGFR  5. Chronic bilateral low back pain, with sciatica presence unspecified - CMP14+EGFR - HYDROcodone-acetaminophen (NORCO) 5-325 MG tablet; Take 1 tablet by mouth every 6 (six) hours as needed for moderate pain.  Dispense: 60 tablet; Refill: 0 - HYDROcodone-acetaminophen (NORCO/VICODIN) 5-325 MG tablet; Take 1 tablet by mouth every 6 (six) hours as needed for moderate pain.  Dispense: 60 tablet; Refill: 0 - HYDROcodone-acetaminophen (NORCO/VICODIN) 5-325 MG tablet; Take 1 tablet by mouth every 6 (six) hours as needed for moderate pain.  Dispense: 60 tablet; Refill: 0  6. Essential hypertension - CMP14+EGFR  7.  Insomnia, unspecified type - CMP14+EGFR  8. Obesity (BMI 30-39.9) - CMP14+EGFR  9. Uncomplicated opioid dependence (HCC) - CMP14+EGFR - HYDROcodone-acetaminophen (NORCO) 5-325 MG tablet; Take 1 tablet by mouth every 6 (six) hours as needed for moderate pain.  Dispense: 60 tablet; Refill: 0 - HYDROcodone-acetaminophen (NORCO/VICODIN) 5-325 MG tablet; Take 1 tablet by mouth every 6 (six) hours as needed for moderate pain.  Dispense: 60 tablet; Refill: 0 - HYDROcodone-acetaminophen (NORCO/VICODIN) 5-325 MG tablet; Take 1 tablet by mouth every 6 (six) hours as needed for moderate pain.  Dispense: 60 tablet; Refill: 0  10. Pain medication agreement signed - CMP14+EGFR - HYDROcodone-acetaminophen (NORCO) 5-325 MG tablet; Take 1 tablet by mouth every 6 (six) hours as needed for moderate pain.  Dispense: 60 tablet; Refill: 0 - HYDROcodone-acetaminophen (NORCO/VICODIN) 5-325 MG tablet; Take 1 tablet by mouth every 6 (six) hours as needed for moderate pain.  Dispense: 60 tablet; Refill: 0 - HYDROcodone-acetaminophen (NORCO/VICODIN) 5-325 MG tablet; Take 1 tablet by mouth every 6 (six) hours as needed for moderate pain.  Dispense: 60 tablet; Refill: 0  11. Other polyneuropathy - CMP14+EGFR  12. Chronic gout without tophus, unspecified cause, unspecified site - CMP14+EGFR  13. Colon cancer screening - CMP14+EGFR - Fecal occult blood, imunochemical; Future  14. Elevated glucose - CMP14+EGFR - Bayer DCA Hb A1c Waived  15. Chronic bilateral low back pain without sciatica - HYDROcodone-acetaminophen (NORCO) 5-325 MG tablet; Take 1 tablet by mouth every 6 (six) hours as needed for moderate pain.  Dispense: 60 tablet; Refill: 0 - HYDROcodone-acetaminophen (NORCO/VICODIN) 5-325 MG tablet; Take 1 tablet  by mouth every 6 (six) hours as needed for moderate pain.  Dispense: 60 tablet; Refill: 0 - HYDROcodone-acetaminophen (NORCO/VICODIN) 5-325 MG tablet; Take 1 tablet by mouth every 6 (six) hours as  needed for moderate pain.  Dispense: 60 tablet; Refill: 0  PT reviewed in [place] controlled database. Pt has only received controlled medications from me.    Continue all meds Labs pending Health Maintenance reviewed Diet and exercise encouraged RTO 3 months   Evelina Dun, FNP

## 2017-02-25 ENCOUNTER — Other Ambulatory Visit: Payer: Self-pay | Admitting: *Deleted

## 2017-02-25 LAB — CMP14+EGFR
A/G RATIO: 1.4 (ref 1.2–2.2)
ALBUMIN: 4 g/dL (ref 3.6–4.8)
ALT: 9 IU/L (ref 0–44)
AST: 27 IU/L (ref 0–40)
Alkaline Phosphatase: 96 IU/L (ref 39–117)
BUN / CREAT RATIO: 7 — AB (ref 10–24)
BUN: 14 mg/dL (ref 8–27)
Bilirubin Total: 1.1 mg/dL (ref 0.0–1.2)
CALCIUM: 8.7 mg/dL (ref 8.6–10.2)
CO2: 23 mmol/L (ref 20–29)
CREATININE: 1.87 mg/dL — AB (ref 0.76–1.27)
Chloride: 100 mmol/L (ref 96–106)
GFR, EST AFRICAN AMERICAN: 41 mL/min/{1.73_m2} — AB (ref >59)
GFR, EST NON AFRICAN AMERICAN: 36 mL/min/{1.73_m2} — AB (ref >59)
GLOBULIN, TOTAL: 2.8 g/dL (ref 1.5–4.5)
Glucose: 96 mg/dL (ref 65–99)
Potassium: 4.4 mmol/L (ref 3.5–5.2)
SODIUM: 139 mmol/L (ref 134–144)
Total Protein: 6.8 g/dL (ref 6.0–8.5)

## 2017-02-25 LAB — LIPID PANEL
CHOL/HDL RATIO: 2.7 ratio (ref 0.0–5.0)
Cholesterol, Total: 129 mg/dL (ref 100–199)
HDL: 48 mg/dL (ref >39)
LDL CALC: 61 mg/dL (ref 0–99)
Triglycerides: 100 mg/dL (ref 0–149)
VLDL CHOLESTEROL CAL: 20 mg/dL (ref 5–40)

## 2017-02-25 LAB — TSH: TSH: 6.73 u[IU]/mL — AB (ref 0.450–4.500)

## 2017-02-25 MED ORDER — ALLOPURINOL 300 MG PO TABS: 300.0000 mg | ORAL_TABLET | Freq: Every day | ORAL | 1 refills | Status: DC

## 2017-02-28 ENCOUNTER — Other Ambulatory Visit: Payer: Self-pay | Admitting: Family

## 2017-02-28 MED ORDER — LEVOTHYROXINE SODIUM 100 MCG PO TABS: 100.0000 ug | ORAL_TABLET | Freq: Every day | ORAL | 11 refills | Status: DC

## 2017-03-16 ENCOUNTER — Ambulatory Visit (INDEPENDENT_AMBULATORY_CARE_PROVIDER_SITE_OTHER): Payer: Medicare Other

## 2017-03-16 DIAGNOSIS — Z23 Encounter for immunization: Secondary | ICD-10-CM | POA: Diagnosis not present

## 2017-04-14 ENCOUNTER — Telehealth: Payer: Self-pay | Admitting: *Deleted

## 2017-04-14 DIAGNOSIS — G6289 Other specified polyneuropathies: Secondary | ICD-10-CM

## 2017-04-14 MED ORDER — CARBIDOPA-LEVODOPA ER 50-200 MG PO TBCR: 1.0000 | EXTENDED_RELEASE_TABLET | Freq: Two times a day (BID) | ORAL | 1 refills | Status: DC

## 2017-04-14 MED ORDER — GABAPENTIN 300 MG PO CAPS: 300.0000 mg | ORAL_CAPSULE | Freq: Three times a day (TID) | ORAL | 1 refills | Status: DC

## 2017-04-14 NOTE — Telephone Encounter (Signed)
Refill sent to pharmacy.   

## 2017-04-27 ENCOUNTER — Other Ambulatory Visit: Payer: Self-pay | Admitting: *Deleted

## 2017-04-27 DIAGNOSIS — J302 Other seasonal allergic rhinitis: Secondary | ICD-10-CM

## 2017-04-27 MED ORDER — FLUTICASONE PROPIONATE 50 MCG/ACT NA SUSP: 2.0000 | Freq: Every day | NASAL | 5 refills | Status: DC

## 2017-05-26 ENCOUNTER — Encounter: Payer: Self-pay | Admitting: Family

## 2017-05-26 ENCOUNTER — Ambulatory Visit (INDEPENDENT_AMBULATORY_CARE_PROVIDER_SITE_OTHER): Payer: Medicare Other | Admitting: Family

## 2017-05-26 VITALS — BP 138/79 | HR 83 | Temp 98.2°F | Ht 71.0 in | Wt 235.0 lb

## 2017-05-26 DIAGNOSIS — E039 Hypothyroidism, unspecified: Secondary | ICD-10-CM | POA: Diagnosis not present

## 2017-05-26 DIAGNOSIS — M1990 Unspecified osteoarthritis, unspecified site: Secondary | ICD-10-CM | POA: Diagnosis not present

## 2017-05-26 DIAGNOSIS — M545 Low back pain: Secondary | ICD-10-CM

## 2017-05-26 DIAGNOSIS — I10 Essential (primary) hypertension: Secondary | ICD-10-CM | POA: Diagnosis not present

## 2017-05-26 DIAGNOSIS — Z0289 Encounter for other administrative examinations: Secondary | ICD-10-CM

## 2017-05-26 DIAGNOSIS — G6289 Other specified polyneuropathies: Secondary | ICD-10-CM | POA: Diagnosis not present

## 2017-05-26 DIAGNOSIS — F1120 Opioid dependence, uncomplicated: Secondary | ICD-10-CM | POA: Diagnosis not present

## 2017-05-26 DIAGNOSIS — Z1211 Encounter for screening for malignant neoplasm of colon: Secondary | ICD-10-CM

## 2017-05-26 DIAGNOSIS — E669 Obesity, unspecified: Secondary | ICD-10-CM | POA: Diagnosis not present

## 2017-05-26 DIAGNOSIS — E785 Hyperlipidemia, unspecified: Secondary | ICD-10-CM

## 2017-05-26 DIAGNOSIS — J45909 Unspecified asthma, uncomplicated: Secondary | ICD-10-CM

## 2017-05-26 DIAGNOSIS — G8929 Other chronic pain: Secondary | ICD-10-CM

## 2017-05-26 DIAGNOSIS — G4700 Insomnia, unspecified: Secondary | ICD-10-CM

## 2017-05-26 MED ORDER — HYDROCODONE-ACETAMINOPHEN 5-325 MG PO TABS: 1.0000 | ORAL_TABLET | Freq: Four times a day (QID) | ORAL | 0 refills | Status: DC | PRN

## 2017-05-26 MED ORDER — GABAPENTIN 600 MG PO TABS
600.0000 mg | ORAL_TABLET | Freq: Three times a day (TID) | ORAL | 3 refills | Status: DC
Start: 2017-05-26 — End: 2017-08-25

## 2017-05-26 NOTE — Patient Instructions (Signed)
Neuropathic Pain Neuropathic pain is pain caused by damage to the nerves that are responsible for certain sensations in your body (sensory nerves). The pain can be caused by damage to:  The sensory nerves that send signals to your spinal cord and brain (peripheral nervous system).  The sensory nerves in your brain or spinal cord (central nervous system).  Neuropathic pain can make you more sensitive to pain. What would be a minor sensation for most people may feel very painful if you have neuropathic pain. This is usually a long-term condition that can be difficult to treat. The type of pain can differ from person to person. It may start suddenly (acute), or it may develop slowly and last for a long time (chronic). Neuropathic pain may come and go as damaged nerves heal or may stay at the same level for years. It often causes emotional distress, loss of sleep, and a lower quality of life. What are the causes? The most common cause of damage to a sensory nerve is diabetes. Many other diseases and conditions can also cause neuropathic pain. Causes of neuropathic pain can be classified as:  Toxic. Many drugs and chemicals can cause toxic damage. The most common cause of toxic neuropathic pain is damage from drug treatment for cancer (chemotherapy).  Metabolic. This type of pain can happen when a disease causes imbalances that damage nerves. Diabetes is the most common of these diseases. Vitamin B deficiency caused by long-term alcohol abuse is another common cause.  Traumatic. Any injury that cuts, crushes, or stretches a nerve can cause damage and pain. A common example is feeling pain after losing an arm or leg (phantom limb pain).  Compression-related. If a sensory nerve gets trapped or compressed for a long period of time, the blood supply to the nerve can be cut off.  Vascular. Many blood vessel diseases can cause neuropathic pain by decreasing blood supply and oxygen to nerves.  Autoimmune.  This type of pain results from diseases in which the body's defense system mistakenly attacks sensory nerves. Examples of autoimmune diseases that can cause neuropathic pain include lupus and multiple sclerosis.  Infectious. Many types of viral infections can damage sensory nerves and cause pain. Shingles infection is a common cause of this type of pain.  Inherited. Neuropathic pain can be a symptom of many diseases that are passed down through families (genetic).  What are the signs or symptoms? The main symptom is pain. Neuropathic pain is often described as:  Burning.  Shock-like.  Stinging.  Hot or cold.  Itching.  How is this diagnosed? No single test can diagnose neuropathic pain. Your health care provider will do a physical exam and ask you about your pain. You may use a pain scale to describe how bad your pain is. You may also have tests to see if you have a high sensitivity to pain and to help find the cause and location of any sensory nerve damage. These tests may include:  Imaging studies, such as: ? X-rays. ? CT scan. ? MRI.  Nerve conduction studies to test how well nerve signals travel through your sensory nerves (electrodiagnostic testing).  Stimulating your sensory nerves through electrodes on your skin and measuring the response in your spinal cord and brain (somatosensory evoked potentials).  How is this treated? Treatment for neuropathic pain may change over time. You may need to try different treatment options or a combination of treatments. Some options include:  Over-the-counter pain relievers.  Prescription medicines. Some medicines   used to treat other conditions may also help neuropathic pain. These include medicines to: ? Control seizures (anticonvulsants). ? Relieve depression (antidepressants).  Prescription-strength pain relievers (narcotics). These are usually used when other pain relievers do not help.  Transcutaneous nerve stimulation (TENS).  This uses electrical currents to block painful nerve signals. The treatment is painless.  Topical and local anesthetics. These are medicines that numb the nerves. They can be injected as a nerve block or applied to the skin.  Alternative treatments, such as: ? Acupuncture. ? Meditation. ? Massage. ? Physical therapy. ? Pain management programs. ? Counseling.  Follow these instructions at home:  Learn as much as you can about your condition.  Take medicines only as directed by your health care provider.  Work closely with all your health care providers to find what works best for you.  Have a good support system at home.  Consider joining a chronic pain support group. Contact a health care provider if:  Your pain treatments are not helping.  You are having side effects from your medicines.  You are struggling with fatigue, mood changes, depression, or anxiety. This information is not intended to replace advice given to you by your health care provider. Make sure you discuss any questions you have with your health care provider. Document Released: 03/04/2004 Document Revised: 12/26/2015 Document Reviewed: 11/15/2013 Elsevier Interactive Patient Education  2018 [employer]  

## 2017-05-26 NOTE — Addendum Note (Signed)
Addended by: Liliane Bade on: 05/26/2017 10:26 AM   Modules accepted: Orders

## 2017-05-26 NOTE — Progress Notes (Signed)
Subjective:    Patient ID: Andrew Phelps, male    DOB: 1946-03-06, 71 y.o.   MRN: [id number]  Pt presents to the office today for chronic follow up and pain medication refill. Hypertension  This is a chronic problem. The current episode started more than 1 year ago. The problem has been resolved since onset. The problem is controlled. Pertinent negatives include no headaches, peripheral edema or shortness of breath. Risk factors for coronary artery disease include dyslipidemia, family history, obesity and sedentary lifestyle. The current treatment provides moderate improvement. There is no history of kidney disease, CAD/MI or heart failure. Identifiable causes of hypertension include a thyroid problem.  Thyroid Problem  Presents for follow-up visit. Symptoms include hoarse voice. Patient reports no anxiety or diarrhea. The symptoms have been stable. His past medical history is significant for hyperlipidemia. There is no history of heart failure.  Arthritis  Presents for follow-up visit. He complains of pain and stiffness. Affected location: back. His pain is at a severity of 8/10. Pertinent negatives include no diarrhea.  Hyperlipidemia  This is a chronic problem. The current episode started more than 1 year ago. The problem is controlled. Recent lipid tests were reviewed and are normal. Exacerbating diseases include obesity. Pertinent negatives include no shortness of breath. Current antihyperlipidemic treatment includes statins. The current treatment provides moderate improvement of lipids. Risk factors for coronary artery disease include dyslipidemia, male sex, obesity and a sedentary lifestyle.  Back Pain  This is a chronic problem. The problem occurs intermittently. The problem has been waxing and waning since onset. The pain is present in the lumbar spine. The pain is at a severity of 8/10. The pain is moderate. Pertinent negatives include no headaches. The treatment provided moderate  relief.  Asthma  He complains of hoarse voice. There is no shortness of breath. This is a chronic problem. The current episode started more than 1 year ago. The problem occurs intermittently. The problem has been waxing and waning. Associated symptoms include nasal congestion and rhinorrhea. Pertinent negatives include no headaches. He reports moderate improvement on treatment. His past medical history is significant for asthma.  Peripheral Neuropathy Takes gabapentin 300 mg TID. Pt states he continues to have intermittent burning, tingling pain of 9 out 10.     Review of Systems  HENT: Positive for hoarse voice and rhinorrhea.   Respiratory: Negative for shortness of breath.   Gastrointestinal: Negative for diarrhea.  Musculoskeletal: Positive for arthritis, back pain and stiffness.  Neurological: Negative for headaches.  Psychiatric/Behavioral: The patient is not nervous/anxious.   All other systems reviewed and are negative.      Objective:   Physical Exam  Constitutional: He is oriented to person, place, and time. He appears well-developed and well-nourished. No distress.  HENT:  Head: Normocephalic.  Right Ear: External ear normal.  Left Ear: External ear normal.  Nose: Mucosal edema and rhinorrhea present.  Mouth/Throat: Posterior oropharyngeal erythema present.  Eyes: Pupils are equal, round, and reactive to light. Right eye exhibits no discharge. Left eye exhibits no discharge.  Neck: Normal range of motion. Neck supple. No thyromegaly present.  Cardiovascular: Normal rate, regular rhythm, normal heart sounds and intact distal pulses.  No murmur heard. Pulmonary/Chest: Effort normal and breath sounds normal. No respiratory distress. He has no wheezes.  Abdominal: Soft. Bowel sounds are normal. He exhibits no distension. There is no tenderness.  Musculoskeletal: Normal range of motion. He exhibits tenderness (mild lumbar pain with flexion). He exhibits no edema.  Neurological: He is alert and oriented to person, place, and time.  Skin: Skin is warm and dry. No rash noted. No erythema.  Psychiatric: He has a normal mood and affect. His behavior is normal. Judgment and thought content normal.  Vitals reviewed.    BP 138/79   Pulse 83   Temp 98.2 F (36.8 C) (Oral)   Ht 5' 11" (1.803 m)   Wt 235 lb (106.6 kg)   BMI 32.78 kg/m      Assessment & Plan:  1. Essential hypertension - CMP14+EGFR  2. Chronic asthma without complication, unspecified asthma severity, unspecified whether persistent - CMP14+EGFR  3. Chronic bilateral low back pain, with sciatica presence unspecified - CMP14+EGFR - HYDROcodone-acetaminophen (NORCO/VICODIN) 5-325 MG tablet; Take 1 tablet by mouth every 6 (six) hours as needed for moderate pain.  Dispense: 60 tablet; Refill: 0 - HYDROcodone-acetaminophen (NORCO) 5-325 MG tablet; Take 1 tablet by mouth every 6 (six) hours as needed for moderate pain.  Dispense: 60 tablet; Refill: 0 - HYDROcodone-acetaminophen (NORCO/VICODIN) 5-325 MG tablet; Take 1 tablet by mouth every 6 (six) hours as needed for moderate pain.  Dispense: 60 tablet; Refill: 0  4. Obesity (BMI 30-39.9) - CMP14+EGFR  5. Hyperlipidemia, unspecified hyperlipidemia type - CMP14+EGFR  6. Insomnia, unspecified type - CMP14+EGFR  7. Arthritis - CMP14+EGFR - HYDROcodone-acetaminophen (NORCO/VICODIN) 5-325 MG tablet; Take 1 tablet by mouth every 6 (six) hours as needed for moderate pain.  Dispense: 60 tablet; Refill: 0 - HYDROcodone-acetaminophen (NORCO) 5-325 MG tablet; Take 1 tablet by mouth every 6 (six) hours as needed for moderate pain.  Dispense: 60 tablet; Refill: 0 - HYDROcodone-acetaminophen (NORCO/VICODIN) 5-325 MG tablet; Take 1 tablet by mouth every 6 (six) hours as needed for moderate pain.  Dispense: 60 tablet; Refill: 0  8. Other polyneuropathy Will increase gabapentin to 600 mg TID - gabapentin (NEURONTIN) 600 MG tablet; Take 1 tablet  (600 mg total) by mouth 3 (three) times daily.  Dispense: 90 tablet; Refill: 3 - CMP14+EGFR  9. Hypothyroidism, unspecified type - CMP14+EGFR - TSH  10. Uncomplicated opioid dependence (HCC) - CMP14+EGFR - HYDROcodone-acetaminophen (NORCO/VICODIN) 5-325 MG tablet; Take 1 tablet by mouth every 6 (six) hours as needed for moderate pain.  Dispense: 60 tablet; Refill: 0 - HYDROcodone-acetaminophen (NORCO) 5-325 MG tablet; Take 1 tablet by mouth every 6 (six) hours as needed for moderate pain.  Dispense: 60 tablet; Refill: 0 - HYDROcodone-acetaminophen (NORCO/VICODIN) 5-325 MG tablet; Take 1 tablet by mouth every 6 (six) hours as needed for moderate pain.  Dispense: 60 tablet; Refill: 0  11. Pain medication agreement signed - CMP14+EGFR - HYDROcodone-acetaminophen (NORCO/VICODIN) 5-325 MG tablet; Take 1 tablet by mouth every 6 (six) hours as needed for moderate pain.  Dispense: 60 tablet; Refill: 0 - HYDROcodone-acetaminophen (NORCO) 5-325 MG tablet; Take 1 tablet by mouth every 6 (six) hours as needed for moderate pain.  Dispense: 60 tablet; Refill: 0 - HYDROcodone-acetaminophen (NORCO/VICODIN) 5-325 MG tablet; Take 1 tablet by mouth every 6 (six) hours as needed for moderate pain.  Dispense: 60 tablet; Refill: 0  12. Chronic bilateral low back pain without sciatica - HYDROcodone-acetaminophen (NORCO/VICODIN) 5-325 MG tablet; Take 1 tablet by mouth every 6 (six) hours as needed for moderate pain.  Dispense: 60 tablet; Refill: 0 - HYDROcodone-acetaminophen (NORCO) 5-325 MG tablet; Take 1 tablet by mouth every 6 (six) hours as needed for moderate pain.  Dispense: 60 tablet; Refill: 0 - HYDROcodone-acetaminophen (NORCO/VICODIN) 5-325 MG tablet; Take 1 tablet by mouth every 6 (six) hours as  needed for moderate pain.  Dispense: 60 tablet; Refill: 0  Pt reviewed in [place] controlled database- Pt has only received controlled medications from me.   Continue all meds Labs pending Health Maintenance  reviewed Diet and exercise encouraged RTO 3 months   Evelina Dun, FNP

## 2017-05-27 LAB — CMP14+EGFR
ALBUMIN: 4.1 g/dL (ref 3.6–4.8)
ALK PHOS: 100 IU/L (ref 39–117)
ALT: 6 IU/L (ref 0–44)
AST: 18 IU/L (ref 0–40)
Albumin/Globulin Ratio: 1.5 (ref 1.2–2.2)
BUN / CREAT RATIO: 7 — AB (ref 10–24)
BUN: 11 mg/dL (ref 8–27)
Bilirubin Total: 1 mg/dL (ref 0.0–1.2)
CO2: 24 mmol/L (ref 20–29)
CREATININE: 1.5 mg/dL — AB (ref 0.76–1.27)
Calcium: 9.3 mg/dL (ref 8.6–10.2)
Chloride: 100 mmol/L (ref 96–106)
GFR calc non Af Amer: 47 mL/min/{1.73_m2} — ABNORMAL LOW (ref >59)
GFR, EST AFRICAN AMERICAN: 54 mL/min/{1.73_m2} — AB (ref >59)
GLOBULIN, TOTAL: 2.7 g/dL (ref 1.5–4.5)
Glucose: 93 mg/dL (ref 65–99)
Potassium: 4.7 mmol/L (ref 3.5–5.2)
SODIUM: 138 mmol/L (ref 134–144)
Total Protein: 6.8 g/dL (ref 6.0–8.5)

## 2017-05-27 LAB — TSH: TSH: 1.28 u[IU]/mL (ref 0.450–4.500)

## 2017-05-31 ENCOUNTER — Other Ambulatory Visit: Payer: Self-pay | Admitting: *Deleted

## 2017-05-31 MED ORDER — ALLOPURINOL 300 MG PO TABS: 300.0000 mg | ORAL_TABLET | Freq: Every day | ORAL | 1 refills | Status: DC

## 2017-05-31 MED ORDER — SIMVASTATIN 20 MG PO TABS: 20.0000 mg | ORAL_TABLET | Freq: Every day | ORAL | 1 refills | Status: DC

## 2017-06-01 LAB — FECAL OCCULT BLOOD, IMMUNOCHEMICAL: Fecal Occult Bld: POSITIVE — AB

## 2017-06-02 ENCOUNTER — Encounter: Payer: Self-pay | Admitting: Internal Medicine

## 2017-06-02 ENCOUNTER — Other Ambulatory Visit: Payer: Self-pay | Admitting: Family

## 2017-06-02 DIAGNOSIS — R195 Other fecal abnormalities: Secondary | ICD-10-CM

## 2017-06-24 ENCOUNTER — Other Ambulatory Visit: Payer: Self-pay

## 2017-06-24 ENCOUNTER — Telehealth: Payer: Self-pay

## 2017-06-24 MED ORDER — CARBIDOPA-LEVODOPA ER 50-200 MG PO TBCR: 1.0000 | EXTENDED_RELEASE_TABLET | Freq: Two times a day (BID) | ORAL | 1 refills | Status: DC

## 2017-06-24 NOTE — Telephone Encounter (Signed)
[facility] will give patient one refill on each medication and tell him he will need an appointment for more refills.  Voice mail full at patient's number.

## 2017-06-24 NOTE — Telephone Encounter (Signed)
Received fax requesting refill on patients colchicine and uloric. Has not had these in a while. Patient of Alyse Low. Ok to fill?

## 2017-06-24 NOTE — Telephone Encounter (Signed)
Okay to go ahead and refill, give him 1 months worth and have him follow-up with Alyse Low

## 2017-07-14 ENCOUNTER — Ambulatory Visit (INDEPENDENT_AMBULATORY_CARE_PROVIDER_SITE_OTHER): Payer: Medicare Other | Admitting: *Deleted

## 2017-07-14 VITALS — BP 133/81 | HR 94 | Temp 98.6°F | Ht 70.0 in | Wt 245.6 lb

## 2017-07-14 DIAGNOSIS — Z0000 Encounter for general adult medical examination without abnormal findings: Secondary | ICD-10-CM | POA: Diagnosis not present

## 2017-07-14 NOTE — Progress Notes (Signed)
Subjective:   Andrew Phelps is a 72 y.o. male who presents for an Initial Medicare Annual Wellness Visit. Andrew Phelps is here today for his initial Medicare Annual wellness visit. Patient retired from the tree service. Patient enjoys collecting guns, deer hunting, gardening, playing golf, and playing horse shoes. Patient states he walks daily. He states his diet is semi-healthy. He does not attend church or is not involved with any organizations. Patient lives in [place], [place] with a friend. He is divorced and has 3 children. He does have a small dog in the home. He does not have any stairs or rugs in the home. Fall hazards were discussed with patient. Patient states that his health seems better than it was a year ago at this time. He has not had any hospitalizations or surgeries in the last year.   Review of Systems   Cardiac Risk Factors include: advanced age (>24men, >63 women);hypertension;male gender;obesity (BMI >30kg/m2)    Objective:    Today's Vitals   07/14/17 0904 07/14/17 0906  BP: 140/85 133/81  Pulse: 93 94  Temp: 98.6 F (37 C)   TempSrc: Oral   Weight: 245 lb 9.6 oz (111.4 kg)   Height: 5\' 10"  (1.778 m)    Body mass index is 35.24 kg/m.  Advanced Directives 07/14/2017  Does Patient Have a Medical Advance Directive? No  Would patient like information on creating a medical advance directive? Yes (MAU/Ambulatory/Procedural Areas - Information given)    Current Medications (verified) Outpatient Encounter Medications as of 07/14/2017  Medication Sig  . allopurinol (ZYLOPRIM) 300 MG tablet Take 1 tablet (300 mg total) by mouth daily.  . carbidopa-levodopa (SINEMET CR) 50-200 MG tablet Take 1 tablet by mouth 2 (two) times daily.  . colchicine 0.6 MG tablet TAKE TWO (2) TABLETS BY MOUTH AS DIRECTED THEN ONE TABLET ONE HOUR LATER. MAX 3 TABLETS PER DAY.  . fluticasone (FLONASE) 50 MCG/ACT nasal spray Place 2 sprays daily into both nostrils.  Marland Kitchen gabapentin (NEURONTIN)  600 MG tablet Take 1 tablet (600 mg total) by mouth 3 (three) times daily.  Marland Kitchen HYDROcodone-acetaminophen (NORCO) 5-325 MG tablet Take 1 tablet by mouth every 6 (six) hours as needed for moderate pain.  Marland Kitchen HYDROcodone-acetaminophen (NORCO/VICODIN) 5-325 MG tablet Take 1 tablet by mouth every 6 (six) hours as needed for moderate pain.  Marland Kitchen HYDROcodone-acetaminophen (NORCO/VICODIN) 5-325 MG tablet Take 1 tablet by mouth every 6 (six) hours as needed for moderate pain.  Marland Kitchen levothyroxine (SYNTHROID) 100 MCG tablet Take 1 tablet (100 mcg total) by mouth daily.  Marland Kitchen loratadine (CLARITIN) 10 MG tablet Take 1 tablet (10 mg total) by mouth daily.  . montelukast (SINGULAIR) 10 MG tablet Take 1 tablet (10 mg total) by mouth at bedtime.  . simvastatin (ZOCOR) 20 MG tablet Take 1 tablet (20 mg total) by mouth daily.  Marland Kitchen ULORIC 40 MG tablet Take 40 mg by mouth daily.   No facility-administered encounter medications on file as of 07/14/2017.     Allergies (verified) Penicillins   History: Past Medical History:  Diagnosis Date  . Arthritis   . Asthma   . Back pain   . Gout   . Hyperlipidemia   . Hypertension   . Insomnia   . Joint pain   . Thyroid disease    Past Surgical History:  Procedure Laterality Date  . none     Family History  Problem Relation Age of Onset  . Heart disease Mother   . Heart disease Father   .  Hypertension Brother   . Hyperlipidemia Brother   . Heart disease Sister   . Heart disease Brother   . Heart disease Brother   . Cancer Brother   . Colon cancer Neg Hx   . Liver disease Neg Hx    Social History   Socioeconomic History  . Marital status: Divorced    Spouse name: None  . Number of children: 3  . Years of education: None  . Highest education level: None  Social Needs  . Financial resource strain: Not hard at all  . Food insecurity - worry: Never true  . Food insecurity - inability: Never true  . Transportation needs - medical: No  . Transportation needs -  non-medical: No  Occupational History  . None  Tobacco Use  . Smoking status: Former Smoker    Last attempt to quit: 07/06/1995    Years since quitting: 22.0  . Smokeless tobacco: Current User    Types: Chew  Substance and Sexual Activity  . Alcohol use: Yes    Comment: history of heavy ETOH use,   . Drug use: No  . Sexual activity: Yes  Other Topics Concern  . None  Social History Narrative  . None   Tobacco Counseling Ready to quit: Not Answered Counseling given: Not Answered Patient stopped smoking in 1997  Clinical Intake:   Activities of Daily Living In your present state of health, do you have any difficulty performing the following activities: 07/14/2017  Hearing? Y  Vision? N  Comment Patient does wear glasses and states he has no trouble while wearing glasses  Difficulty concentrating or making decisions? N  Walking or climbing stairs? N  Dressing or bathing? N  Doing errands, shopping? N  Preparing Food and eating ? N  Using the Toilet? N  In the past six months, have you accidently leaked urine? N  Do you have problems with loss of bowel control? N  Managing your Medications? N  Managing your Finances? N  Housekeeping or managing your Housekeeping? N  Some recent data might be hidden    Patient does complain with a decrease in hearing and was advised to see audiologist  Patient does wear glasses and does not have any trouble seeing while wearing glasses Immunizations and Health Maintenance Immunization History  Administered Date(s) Administered  . Influenza,inj,Quad PF,6+ Mos 07/05/2013, 05/07/2015, 03/16/2017  . Pneumococcal Conjugate-13 07/05/2013, 12/11/2014  . Pneumococcal Polysaccharide-23 11/18/2016  . Tdap 10/11/2013   Health Maintenance Due  Topic Date Due  . COLONOSCOPY  11/04/1997  Patient is scheduled to see GI on 08/03/2017  Patient Care Team: Sharion Balloon, FNP as PCP - General (Family Medicine) Gala Romney, Cristopher Estimable, MD as Consulting  Physician (Gastroenterology)  Indicate any recent Medical Services you may have received from other than [facility] providers in the past year (date may be approximate).    Assessment:   This is a routine wellness examination for [place].  Hearing/Vision screen No exam data present Patient does complain with decrease in hearing.  Patient has no complains with vision  Dietary issues and exercise activities discussed: Current Exercise Habits: Home exercise routine, Type of exercise: walking, Time (Minutes): 30, Frequency (Times/Week): 6, Weekly Exercise (Minutes/Week): 180, Intensity: Mild  Goals    . Exercise 3x per week (30 min per time)      Depression Screen PHQ 2/9 Scores 07/14/2017 05/26/2017 02/24/2017 11/18/2016  PHQ - 2 Score 0 0 1 0    Fall Risk Fall Risk  07/14/2017  05/26/2017 02/24/2017 11/18/2016 09/30/2016  Falls in the past year? No No No No No    Is the patient's home free of loose throw rugs in walkways, pet beds, electrical cords, etc?   yes      Grab bars in the bathroom? no      Adequate lighting?   yes  Timed Get Up and Go performed:   Cognitive Function: MMSE - Mini Mental State Exam 07/14/2017  Orientation to time 5  Orientation to Place 5  Registration 3  Attention/ Calculation 5  Recall 2  Language- name 2 objects 2  Language- repeat 1  Language- follow 3 step command 3  Language- read & follow direction 1  Write a sentence 1  Copy design 1  Total score 29        Screening Tests Health Maintenance  Topic Date Due  . COLONOSCOPY  11/04/1997  . TETANUS/TDAP  10/12/2023  . INFLUENZA VACCINE  Completed  . Hepatitis C Screening  Completed  . PNA vac Low Risk Adult  Completed   Patient scheduled for GI referral in 08/03/17  Qualifies for Shingles Vaccine? Patient does qualify but declines   Cancer Screenings: Colorectal: Patient is going to have colonoscopy in February 2019  Additional Screenings: Hepatitis B/HIV/Syphillis: Hepatitis C Screening:        Plan:   Patient to keep follow up with GI Roseanne Kaufman, NP Patient to follow up with Evelina Dun in March Patient was advised to schedule eye exam  Patient to discuss audiology referral with provider at next visit.  I have personally reviewed and noted the following in the patient's chart:   . Medical and social history . Use of alcohol, tobacco or illicit drugs  . Current medications and supplements . Functional ability and status . Nutritional status . Physical activity . Advanced directives . List of other physicians . Hospitalizations, surgeries, and ER visits in previous 12 months . Vitals . Screenings to include cognitive, depression, and falls . Referrals and appointments  In addition, I have reviewed and discussed with patient certain preventive protocols, quality metrics, and best practice recommendations. A written personalized care plan for preventive services as well as general preventive health recommendations were provided to patient.     Gareth Morgan, LPN   7/42/[id number]    I have reviewed and agree with the above AWV documentation.   Evelina Dun, FNP

## 2017-07-14 NOTE — Patient Instructions (Signed)
  [name] , Thank you for taking time to come for your Medicare Wellness Visit. I appreciate your ongoing commitment to your health goals. Please review the following plan we discussed and let me know if I can assist you in the future.   These are the goals we discussed: Goals    . Exercise 3x per week (30 min per time)       This is a list of the screening recommended for you and due dates:  Health Maintenance  Topic Date Due  . Colon Cancer Screening  11/04/1997  . Tetanus Vaccine  10/12/2023  . Flu Shot  Completed  .  Hepatitis C: One time screening is recommended by Center for Disease Control  (CDC) for  adults born from [birth year] through 1963.   Completed  . Pneumonia vaccines  Completed   Keep follow up appointment with GI for colonoscopy  Please schedule eye exam

## 2017-07-26 ENCOUNTER — Other Ambulatory Visit: Payer: Self-pay | Admitting: Family Medicine

## 2017-08-03 ENCOUNTER — Ambulatory Visit (INDEPENDENT_AMBULATORY_CARE_PROVIDER_SITE_OTHER): Payer: Medicare Other | Admitting: Gastroenterology

## 2017-08-03 ENCOUNTER — Encounter: Payer: Self-pay | Admitting: Gastroenterology

## 2017-08-03 ENCOUNTER — Telehealth: Payer: Self-pay | Admitting: *Deleted

## 2017-08-03 ENCOUNTER — Other Ambulatory Visit: Payer: Self-pay | Admitting: *Deleted

## 2017-08-03 ENCOUNTER — Encounter: Payer: Self-pay | Admitting: *Deleted

## 2017-08-03 ENCOUNTER — Other Ambulatory Visit: Payer: Self-pay

## 2017-08-03 VITALS — BP 151/93 | HR 95 | Temp 97.3°F | Ht 71.0 in | Wt 247.0 lb

## 2017-08-03 DIAGNOSIS — R195 Other fecal abnormalities: Secondary | ICD-10-CM | POA: Insufficient documentation

## 2017-08-03 DIAGNOSIS — J302 Other seasonal allergic rhinitis: Secondary | ICD-10-CM

## 2017-08-03 DIAGNOSIS — D649 Anemia, unspecified: Secondary | ICD-10-CM | POA: Diagnosis not present

## 2017-08-03 LAB — CBC WITH DIFFERENTIAL/PLATELET
BASOS ABS: 78 {cells}/uL (ref 0–200)
Basophils Relative: 0.8 %
EOS ABS: 301 {cells}/uL (ref 15–500)
Eosinophils Relative: 3.1 %
HEMATOCRIT: 38.2 % — AB (ref 38.5–50.0)
Hemoglobin: 13.3 g/dL (ref 13.2–17.1)
LYMPHS ABS: 2008 {cells}/uL (ref 850–3900)
MCH: 31.5 pg (ref 27.0–33.0)
MCHC: 34.8 g/dL (ref 32.0–36.0)
MCV: 90.5 fL (ref 80.0–100.0)
MPV: 10.5 fL (ref 7.5–12.5)
Monocytes Relative: 5.9 %
NEUTROS PCT: 69.5 %
Neutro Abs: 6742 cells/uL (ref 1500–7800)
Platelets: 197 10*3/uL (ref 140–400)
RBC: 4.22 10*6/uL (ref 4.20–5.80)
RDW: 13.8 % (ref 11.0–15.0)
Total Lymphocyte: 20.7 %
WBC: 9.7 10*3/uL (ref 3.8–10.8)
WBCMIX: 572 {cells}/uL (ref 200–950)

## 2017-08-03 MED ORDER — LORATADINE 10 MG PO TABS: 10.0000 mg | ORAL_TABLET | Freq: Every day | ORAL | 3 refills | Status: DC

## 2017-08-03 MED ORDER — NA SULFATE-K SULFATE-MG SULF 17.5-3.13-1.6 GM/177ML PO SOLN: 1.0000 | ORAL | 0 refills | Status: DC

## 2017-08-03 NOTE — Progress Notes (Signed)
Referring Provider: Sharion Balloon, FNP Primary Care Physician:  Sharion Balloon, FNP Primary GI: Dr. Gala Romney   Chief Complaint  Patient presents with  . +fecal occult blood    states he had hemorrhoid; never had tcs    HPI:   Andrew Phelps is a 72 y.o. male presenting today for a colonoscopy. He was last seen in Feb 2018 due to elevated bilirubin. He stopped drinking, and this normalized. US abdomen March 2018 with fatty liver. Multiple repeat LFTs normal. Recently found to have heme positive stool. No prior colonoscopy. He was hesitant to pursue this in the past. No recent CBC.   No overt GI bleeding. No prior colonoscopy. No abdominal pain. Pure sweet milk will constipate him. Usually Bristol stool scale #4. No upper GI symptoms.   Past Medical History:  Diagnosis Date  . Arthritis   . Asthma   . Back pain   . Gout   . Hyperlipidemia   . Hypertension   . Insomnia   . Joint pain   . Thyroid disease     Past Surgical History:  Procedure Laterality Date  . none      Current Outpatient Medications  Medication Sig Dispense Refill  . allopurinol (ZYLOPRIM) 300 MG tablet Take 1 tablet (300 mg total) by mouth daily. 90 tablet 1  . carbidopa-levodopa (SINEMET CR) 50-200 MG tablet Take 1 tablet by mouth 2 (two) times daily. 60 tablet 1  . colchicine 0.6 MG tablet TAKE TWO (2) TABLETS BY MOUTH AS DIRECTED THEN ONE TABLET ONE HOUR LATER. MAX 3 TABLETS PER DAY.  0  . fluticasone (FLONASE) 50 MCG/ACT nasal spray Place 2 sprays daily into both nostrils. 16 g 5  . gabapentin (NEURONTIN) 600 MG tablet Take 1 tablet (600 mg total) by mouth 3 (three) times daily. 90 tablet 3  . HYDROcodone-acetaminophen (NORCO/VICODIN) 5-325 MG tablet Take 1 tablet by mouth every 6 (six) hours as needed for moderate pain. 60 tablet 0  . levothyroxine (SYNTHROID) 100 MCG tablet Take 1 tablet (100 mcg total) by mouth daily. 30 tablet 11  . loratadine (CLARITIN) 10 MG tablet Take 1 tablet (10  mg total) by mouth daily. 30 tablet 3  . montelukast (SINGULAIR) 10 MG tablet Take 1 tablet (10 mg total) by mouth at bedtime. 90 tablet 2  . simvastatin (ZOCOR) 20 MG tablet Take 1 tablet (20 mg total) by mouth daily. 90 tablet 1  . ULORIC 40 MG tablet TAKE ONE TABLET BY MOUTH DAILY. 30 tablet 0   No current facility-administered medications for this visit.     Allergies as of 08/03/2017 - Review Complete 08/03/2017  Allergen Reaction Noted  . Codeine  08/03/2017  . Penicillins  06/05/2013    Family History  Problem Relation Age of Onset  . Heart disease Mother   . Heart disease Father   . Hypertension Brother   . Hyperlipidemia Brother   . Heart disease Sister   . Heart disease Brother   . Heart disease Brother   . Cancer Brother   . Colon cancer Neg Hx   . Liver disease Neg Hx     Social History   Socioeconomic History  . Marital status: Divorced    Spouse name: None  . Number of children: 3  . Years of education: None  . Highest education level: None  Social Needs  . Financial resource strain: Not hard at all  . Food insecurity - worry: Never true  .  Food insecurity - inability: Never true  . Transportation needs - medical: No  . Transportation needs - non-medical: No  Occupational History  . None  Tobacco Use  . Smoking status: Former Smoker    Last attempt to quit: 07/06/1995    Years since quitting: 22.0  . Smokeless tobacco: Current User    Types: Chew  Substance and Sexual Activity  . Alcohol use: Yes    Comment: history of heavy ETOH use, states little bit 08/03/17  . Drug use: No  . Sexual activity: Yes  Other Topics Concern  . None  Social History Narrative  . None    Review of Systems: Gen: Denies fever, chills, anorexia. Denies fatigue, weakness, weight loss.  CV: Denies chest pain, palpitations, syncope, peripheral edema, and claudication. Resp: Denies dyspnea at rest, cough, wheezing, coughing up blood, and pleurisy. GI: negative unless  mentioned in HPI  Derm: Denies rash, itching, dry skin Psych: Denies depression, anxiety, memory loss, confusion. No homicidal or suicidal ideation.  Heme: Denies bruising, bleeding, and enlarged lymph nodes.  Physical Exam: BP (!) 151/93   Pulse 95   Temp (!) 97.3 F (36.3 C) (Oral)   Ht 5\' 11"  (1.803 m)   Wt 247 lb (112 kg)   BMI 34.45 kg/m  General:   Alert and oriented. No distress noted. Pleasant and cooperative.  Head:  Normocephalic and atraumatic. Eyes:  Conjuctiva clear without scleral icterus. Mouth:  Oral mucosa pink and moist.  Abdomen:  +BS, soft, non-tender and non-distended. No rebound or guarding. No HSM or masses noted. Msk:  Symmetrical without gross deformities. Normal posture. Extremities:  Without edema. Neurologic:  Alert and  oriented x4 Psych:  Alert and cooperative. Normal mood and affect.  Lab Results  Component Value Date   ALT 6 05/26/2017   AST 18 05/26/2017   ALKPHOS 100 05/26/2017   BILITOT 1.0 05/26/2017   Lab Results  Component Value Date   CREATININE 1.50 (H) 05/26/2017   BUN 11 05/26/2017   NA 138 05/26/2017   K 4.7 05/26/2017   CL 100 05/26/2017   CO2 24 05/26/2017

## 2017-08-03 NOTE — Telephone Encounter (Signed)
Tried to call pt but not available and VM not set up. Letter mailed. Pre-op scheduled for 08/26/17 at 10:00am.

## 2017-08-03 NOTE — Patient Instructions (Signed)
Please have blood work done today.   We have scheduled you for a colonoscopy with Dr. Gala Romney in the near future. Further recommendations to follow!  It was a pleasure to see you today. I strive to create trusting relationships with patients to provide genuine, compassionate, and quality care. I value your feedback. If you receive a survey regarding your visit,  I greatly appreciate you the taking time to fill this out.   Annitta Needs, PhD, ANP-BC [facility]

## 2017-08-07 NOTE — Assessment & Plan Note (Signed)
72 year old male recently found to have heme positive stool. No other concerning lower or upper GI symptoms. No recent CBC.   Proceed with TCS with Dr. Gala Romney in near future: the risks, benefits, and alternatives have been discussed with the patient in detail. The patient states understanding and desires to proceed. Propofol due to polypharmacy and history of ETOH abuse: applauded on lifestyle changes and cessation Check updated CBC

## 2017-08-07 NOTE — Assessment & Plan Note (Signed)
Normocytic anemia a year ago. Update now.

## 2017-08-08 NOTE — Progress Notes (Signed)
cc'ed to pcp °

## 2017-08-16 NOTE — Progress Notes (Signed)
Hgb 13.3, improved from 1 year ago. Continue with plans for procedures.

## 2017-08-24 NOTE — Patient Instructions (Signed)
Andrew Phelps  08/24/2017     @PREFPERIOPPHARMACY @   Your procedure is scheduled on  09/01/2017   Report to [facility] at  31   A.M.  Call this number if you have problems the morning of surgery:  [phone]   Remember:  Do not eat food or drink liquids after midnight.  Take these medicines the morning of surgery with A SIP OF WATER allopurinol, sinemet, neurontin, hydrocodone, levothyroxine, claritin, uloric.   Do not wear jewelry, make-up or nail polish.  Do not wear lotions, powders, or perfumes, or deodorant.  Do not shave 48 hours prior to surgery.  Men may shave face and neck.  Do not bring valuables to the hospital.  [facility] is not responsible for any belongings or valuables.  Contacts, dentures or bridgework may not be worn into surgery.  Leave your suitcase in the car.  After surgery it may be brought to your room.  For patients admitted to the hospital, discharge time will be determined by your treatment team.  Patients discharged the day of surgery will not be allowed to drive home.   Name and phone number of your driver:   family Special instructions:  Follow the diet and prep instructions given to you by Dr Roseanne Kaufman office.  Please read over the following fact sheets that you were given. Anesthesia Post-op Instructions and Care and Recovery After Surgery       Colonoscopy, Adult A colonoscopy is an exam to look at the large intestine. It is done to check for problems, such as:  Lumps (tumors).  Growths (polyps).  Swelling (inflammation).  Bleeding.  What happens before the procedure? Eating and drinking Follow instructions from your doctor about eating and drinking. These instructions may include:  A few days before the procedure - follow a low-fiber diet. ? Avoid nuts. ? Avoid seeds. ? Avoid dried fruit. ? Avoid raw fruits. ? Avoid vegetables.  1-3 days before the procedure - follow a clear liquid diet. Avoid  liquids that have red or purple dye. Drink only clear liquids, such as: ? Clear broth or bouillon. ? Black coffee or tea. ? Clear juice. ? Clear soft drinks or sports drinks. ? Gelatin dessert. ? Popsicles.  On the day of the procedure - do not eat or drink anything during the 2 hours before the procedure.  Bowel prep If you were prescribed an oral bowel prep:  Take it as told by your doctor. Starting the day before your procedure, you will need to drink a lot of liquid. The liquid will cause you to poop (have bowel movements) until your poop is almost clear or light green.  If your skin or butt gets irritated from diarrhea, you may: ? Wipe the area with wipes that have medicine in them, such as adult wet wipes with aloe and vitamin E. ? Put something on your skin that soothes the area, such as petroleum jelly.  If you throw up (vomit) while drinking the bowel prep, take a break for up to 60 minutes. Then begin the bowel prep again. If you keep throwing up and you cannot take the bowel prep without throwing up, call your doctor.  General instructions  Ask your doctor about changing or stopping your normal medicines. This is important if you take diabetes medicines or blood thinners.  Plan to have someone take you home from the hospital or clinic. What happens during the procedure?  An IV tube may be put into one of your veins.  You will be given medicine to help you relax (sedative).  To reduce your risk of infection: ? Your doctors will wash their hands. ? Your anal area will be washed with soap.  You will be asked to lie on your side with your knees bent.  Your doctor will get a long, thin, flexible tube ready. The tube will have a camera and a light on the end.  The tube will be put into your anus.  The tube will be gently put into your large intestine.  Air will be delivered into your large intestine to keep it open. You may feel some pressure or cramping.  The  camera will be used to take photos.  A small tissue sample may be removed from your body to be looked at under a microscope (biopsy). If any possible problems are found, the tissue will be sent to a lab for testing.  If small growths are found, your doctor may remove them and have them checked for cancer.  The tube that was put into your anus will be slowly removed. The procedure may vary among doctors and hospitals. What happens after the procedure?  Your doctor will check on you often until the medicines you were given have worn off.  Do not drive for 24 hours after the procedure.  You may have a small amount of blood in your poop.  You may pass gas.  You may have mild cramps or bloating in your belly (abdomen).  It is up to you to get the results of your procedure. Ask your doctor, or the department performing the procedure, when your results will be ready. This information is not intended to replace advice given to you by your health care provider. Make sure you discuss any questions you have with your health care provider. Document Released: 07/10/2010 Document Revised: 04/07/2016 Document Reviewed: 08/19/2015 Elsevier Interactive Patient Education  2017 [employer]  Colonoscopy, Adult, Care After This sheet gives you information about how to care for yourself after your procedure. Your health care provider may also give you more specific instructions. If you have problems or questions, contact your health care provider. What can I expect after the procedure? After the procedure, it is common to have:  A small amount of blood in your stool for 24 hours after the procedure.  Some gas.  Mild abdominal cramping or bloating.  Follow these instructions at home: General instructions   For the first 24 hours after the procedure: ? Do not drive or use machinery. ? Do not sign important documents. ? Do not drink alcohol. ? Do your regular daily activities at a slower pace  than normal. ? Eat soft, easy-to-digest foods. ? Rest often.  Take over-the-counter or prescription medicines only as told by your health care provider.  It is up to you to get the results of your procedure. Ask your health care provider, or the department performing the procedure, when your results will be ready. Relieving cramping and bloating  Try walking around when you have cramps or feel bloated.  Apply heat to your abdomen as told by your health care provider. Use a heat source that your health care provider recommends, such as a moist heat pack or a heating pad. ? Place a towel between your skin and the heat source. ? Leave the heat on for 20-30 minutes. ? Remove the heat if your skin turns bright red. This is  especially important if you are unable to feel pain, heat, or cold. You may have a greater risk of getting burned. Eating and drinking  Drink enough fluid to keep your urine clear or pale yellow.  Resume your normal diet as instructed by your health care provider. Avoid heavy or fried foods that are hard to digest.  Avoid drinking alcohol for as long as instructed by your health care provider. Contact a health care provider if:  You have blood in your stool 2-3 days after the procedure. Get help right away if:  You have more than a small spotting of blood in your stool.  You pass large blood clots in your stool.  Your abdomen is swollen.  You have nausea or vomiting.  You have a fever.  You have increasing abdominal pain that is not relieved with medicine. This information is not intended to replace advice given to you by your health care provider. Make sure you discuss any questions you have with your health care provider. Document Released: 01/20/2004 Document Revised: 03/01/2016 Document Reviewed: 08/19/2015 Elsevier Interactive Patient Education  2018 [place] Anesthesia is a term that refers to techniques, procedures, and  medicines that help a person stay safe and comfortable during a medical procedure. Monitored anesthesia care, or sedation, is one type of anesthesia. Your anesthesia specialist may recommend sedation if you will be having a procedure that does not require you to be unconscious, such as:  Cataract surgery.  A dental procedure.  A biopsy.  A colonoscopy.  During the procedure, you may receive a medicine to help you relax (sedative). There are three levels of sedation:  Mild sedation. At this level, you may feel awake and relaxed. You will be able to follow directions.  Moderate sedation. At this level, you will be sleepy. You may not remember the procedure.  Deep sedation. At this level, you will be asleep. You will not remember the procedure.  The more medicine you are given, the deeper your level of sedation will be. Depending on how you respond to the procedure, the anesthesia specialist may change your level of sedation or the type of anesthesia to fit your needs. An anesthesia specialist will monitor you closely during the procedure. Let your health care provider know about:  Any allergies you have.  All medicines you are taking, including vitamins, herbs, eye drops, creams, and over-the-counter medicines.  Any use of steroids (by mouth or as a cream).  Any problems you or family members have had with sedatives and anesthetic medicines.  Any blood disorders you have.  Any surgeries you have had.  Any medical conditions you have, such as sleep apnea.  Whether you are pregnant or may be pregnant.  Any use of cigarettes, alcohol, or street drugs. What are the risks? Generally, this is a safe procedure. However, problems may occur, including:  Getting too much medicine (oversedation).  Nausea.  Allergic reaction to medicines.  Trouble breathing. If this happens, a breathing tube may be used to help with breathing. It will be removed when you are awake and breathing on  your own.  Heart trouble.  Lung trouble.  Before the procedure Staying hydrated Follow instructions from your health care provider about hydration, which may include:  Up to 2 hours before the procedure - you may continue to drink clear liquids, such as water, clear fruit juice, black coffee, and plain tea.  Eating and drinking restrictions Follow instructions from your health care provider  about eating and drinking, which may include:  8 hours before the procedure - stop eating heavy meals or foods such as meat, fried foods, or fatty foods.  6 hours before the procedure - stop eating light meals or foods, such as toast or cereal.  6 hours before the procedure - stop drinking milk or drinks that contain milk.  2 hours before the procedure - stop drinking clear liquids.  Medicines Ask your health care provider about:  Changing or stopping your regular medicines. This is especially important if you are taking diabetes medicines or blood thinners.  Taking medicines such as aspirin and ibuprofen. These medicines can thin your blood. Do not take these medicines before your procedure if your health care provider instructs you not to.  Tests and exams  You will have a physical exam.  You may have blood tests done to show: ? How well your kidneys and liver are working. ? How well your blood can clot.  General instructions  Plan to have someone take you home from the hospital or clinic.  If you will be going home right after the procedure, plan to have someone with you for 24 hours.  What happens during the procedure?  Your blood pressure, heart rate, breathing, level of pain and overall condition will be monitored.  An IV tube will be inserted into one of your veins.  Your anesthesia specialist will give you medicines as needed to keep you comfortable during the procedure. This may mean changing the level of sedation.  The procedure will be performed. After the  procedure  Your blood pressure, heart rate, breathing rate, and blood oxygen level will be monitored until the medicines you were given have worn off.  Do not drive for 24 hours if you received a sedative.  You may: ? Feel sleepy, clumsy, or nauseous. ? Feel forgetful about what happened after the procedure. ? Have a sore throat if you had a breathing tube during the procedure. ? Vomit. This information is not intended to replace advice given to you by your health care provider. Make sure you discuss any questions you have with your health care provider. Document Released: 03/03/2005 Document Revised: 11/14/2015 Document Reviewed: 09/28/2015 Elsevier Interactive Patient Education  2018 [place], Care After These instructions provide you with information about caring for yourself after your procedure. Your health care provider may also give you more specific instructions. Your treatment has been planned according to current medical practices, but problems sometimes occur. Call your health care provider if you have any problems or questions after your procedure. What can I expect after the procedure? After your procedure, it is common to:  Feel sleepy for several hours.  Feel clumsy and have poor balance for several hours.  Feel forgetful about what happened after the procedure.  Have poor judgment for several hours.  Feel nauseous or vomit.  Have a sore throat if you had a breathing tube during the procedure.  Follow these instructions at home: For at least 24 hours after the procedure:   Do not: ? Participate in activities in which you could fall or become injured. ? Drive. ? Use heavy machinery. ? Drink alcohol. ? Take sleeping pills or medicines that cause drowsiness. ? Make important decisions or sign legal documents. ? Take care of children on your own.  Rest. Eating and drinking  Follow the diet that is recommended by your health  care provider.  If you vomit, drink water, juice,  or soup when you can drink without vomiting.  Make sure you have little or no nausea before eating solid foods. General instructions  Have a responsible adult stay with you until you are awake and alert.  Take over-the-counter and prescription medicines only as told by your health care provider.  If you smoke, do not smoke without supervision.  Keep all follow-up visits as told by your health care provider. This is important. Contact a health care provider if:  You keep feeling nauseous or you keep vomiting.  You feel light-headed.  You develop a rash.  You have a fever. Get help right away if:  You have trouble breathing. This information is not intended to replace advice given to you by your health care provider. Make sure you discuss any questions you have with your health care provider. Document Released: 09/28/2015 Document Revised: 01/28/2016 Document Reviewed: 09/28/2015 Elsevier Interactive Patient Education  [employer].

## 2017-08-25 ENCOUNTER — Ambulatory Visit (INDEPENDENT_AMBULATORY_CARE_PROVIDER_SITE_OTHER): Payer: Medicare Other | Admitting: Family

## 2017-08-25 ENCOUNTER — Encounter: Payer: Self-pay | Admitting: Family

## 2017-08-25 VITALS — BP 133/77 | HR 82 | Temp 98.3°F | Ht 71.0 in | Wt 252.0 lb

## 2017-08-25 DIAGNOSIS — Z0289 Encounter for other administrative examinations: Secondary | ICD-10-CM | POA: Diagnosis not present

## 2017-08-25 DIAGNOSIS — G8929 Other chronic pain: Secondary | ICD-10-CM

## 2017-08-25 DIAGNOSIS — G6289 Other specified polyneuropathies: Secondary | ICD-10-CM

## 2017-08-25 DIAGNOSIS — E039 Hypothyroidism, unspecified: Secondary | ICD-10-CM

## 2017-08-25 DIAGNOSIS — D649 Anemia, unspecified: Secondary | ICD-10-CM

## 2017-08-25 DIAGNOSIS — I10 Essential (primary) hypertension: Secondary | ICD-10-CM | POA: Diagnosis not present

## 2017-08-25 DIAGNOSIS — M1990 Unspecified osteoarthritis, unspecified site: Secondary | ICD-10-CM

## 2017-08-25 DIAGNOSIS — J45909 Unspecified asthma, uncomplicated: Secondary | ICD-10-CM | POA: Diagnosis not present

## 2017-08-25 DIAGNOSIS — F1120 Opioid dependence, uncomplicated: Secondary | ICD-10-CM | POA: Diagnosis not present

## 2017-08-25 DIAGNOSIS — M545 Low back pain: Secondary | ICD-10-CM

## 2017-08-25 DIAGNOSIS — M1A09X0 Idiopathic chronic gout, multiple sites, without tophus (tophi): Secondary | ICD-10-CM

## 2017-08-25 DIAGNOSIS — E785 Hyperlipidemia, unspecified: Secondary | ICD-10-CM | POA: Diagnosis not present

## 2017-08-25 MED ORDER — HYDROCODONE-ACETAMINOPHEN 5-325 MG PO TABS: 1.0000 | ORAL_TABLET | Freq: Four times a day (QID) | ORAL | 0 refills | Status: DC | PRN

## 2017-08-25 MED ORDER — MONTELUKAST SODIUM 10 MG PO TABS
10.0000 mg | ORAL_TABLET | Freq: Every day | ORAL | 2 refills | Status: DC
Start: 2017-08-25 — End: 2018-08-25

## 2017-08-25 MED ORDER — SIMVASTATIN 20 MG PO TABS: 20.0000 mg | ORAL_TABLET | Freq: Every day | ORAL | 1 refills | Status: DC

## 2017-08-25 MED ORDER — ALLOPURINOL 300 MG PO TABS: 300.0000 mg | ORAL_TABLET | Freq: Every day | ORAL | 1 refills | Status: DC

## 2017-08-25 MED ORDER — LEVOTHYROXINE SODIUM 100 MCG PO TABS: 100.0000 ug | ORAL_TABLET | Freq: Every day | ORAL | 11 refills | Status: DC

## 2017-08-25 MED ORDER — GABAPENTIN 600 MG PO TABS: 600.0000 mg | ORAL_TABLET | Freq: Three times a day (TID) | ORAL | 3 refills | Status: DC

## 2017-08-25 MED ORDER — CARBIDOPA-LEVODOPA ER 50-200 MG PO TBCR: 1.0000 | EXTENDED_RELEASE_TABLET | Freq: Two times a day (BID) | ORAL | 1 refills | Status: DC

## 2017-08-25 NOTE — Patient Instructions (Signed)

## 2017-08-25 NOTE — Progress Notes (Signed)
Subjective:    Patient ID: Andrew Phelps, male    DOB: 1945/07/10, 72 y.o.   MRN: [id number]  Pt presents to the office today for chronic follow up and pain medication refill. Hypertension  This is a chronic problem. The current episode started more than 1 year ago. The problem has been resolved since onset. The problem is controlled. Associated symptoms include peripheral edema (" a little bit"). Pertinent negatives include no malaise/fatigue or shortness of breath. Risk factors for coronary artery disease include male gender, obesity and sedentary lifestyle. The current treatment provides moderate improvement. There is no history of kidney disease, CAD/MI, CVA or heart failure. Identifiable causes of hypertension include a thyroid problem.  Thyroid Problem  Presents for follow-up visit. Symptoms include hoarse voice. Patient reports no constipation, depressed mood, dry skin or fatigue. The symptoms have been stable. His past medical history is significant for hyperlipidemia. There is no history of heart failure.  Asthma  He complains of frequent throat clearing and hoarse voice. There is no shortness of breath or wheezing. This is a chronic problem. The current episode started more than 1 year ago. Associated symptoms include nasal congestion, rhinorrhea and sneezing. Pertinent negatives include no ear congestion, ear pain, fever or malaise/fatigue. He reports moderate improvement on treatment. His past medical history is significant for asthma.  Arthritis  Presents for follow-up visit. He complains of pain and stiffness. The symptoms have been stable. Affected locations include the right knee, left knee, right MCP and left MCP (lower back). His pain is at a severity of 8/10. Pertinent negatives include no fatigue or fever.  Hyperlipidemia  This is a chronic problem. The current episode started more than 1 year ago. The problem is controlled. Recent lipid tests were reviewed and are  normal. Exacerbating diseases include obesity. Associated symptoms include leg pain. Pertinent negatives include no shortness of breath. Current antihyperlipidemic treatment includes statins. The current treatment provides moderate improvement of lipids. Risk factors for coronary artery disease include diabetes mellitus, dyslipidemia, hypertension, a sedentary lifestyle and male sex.  Back Pain  This is a chronic problem. The current episode started more than 1 year ago. The problem occurs intermittently. The problem has been waxing and waning since onset. The pain is present in the lumbar spine. The quality of the pain is described as aching. The pain is at a severity of 8/10. The pain is moderate. Associated symptoms include leg pain. Pertinent negatives include no fever, tingling or weakness. He has tried analgesics for the symptoms. The treatment provided mild relief.  Neuropathy Pain Pt currently taking gabapentin 600 mg TID. Pt states this helps. He reports an intermittent burning and tingling pain of 7 out 10.    Review of Systems  Constitutional: Negative for fatigue, fever and malaise/fatigue.  HENT: Positive for hoarse voice, rhinorrhea and sneezing. Negative for ear pain.   Respiratory: Negative for shortness of breath and wheezing.   Gastrointestinal: Negative for constipation.  Musculoskeletal: Positive for arthritis, back pain and stiffness.  Neurological: Negative for tingling and weakness.  All other systems reviewed and are negative.      Objective:   Physical Exam  Constitutional: He is oriented to person, place, and time. He appears well-developed and well-nourished. No distress.  HENT:  Head: Normocephalic.  Right Ear: External ear normal.  Left Ear: External ear normal.  Nose: Nose normal.  Mouth/Throat: Oropharynx is clear and moist.  Eyes: Pupils are equal, round, and reactive to light. Right eye exhibits  no discharge. Left eye exhibits no discharge.  Neck: Normal  range of motion. Neck supple. No thyromegaly present.  Cardiovascular: Normal rate, regular rhythm, normal heart sounds and intact distal pulses.  No murmur heard. Pulmonary/Chest: Effort normal and breath sounds normal. No respiratory distress. He has no wheezes.  Abdominal: Soft. Bowel sounds are normal. He exhibits no distension. There is no tenderness.  Musculoskeletal: Normal range of motion. He exhibits edema (2+ BLE). He exhibits no tenderness.  Neurological: He is alert and oriented to person, place, and time.  Skin: Skin is warm and dry. No rash noted. No erythema.  Psychiatric: He has a normal mood and affect. His behavior is normal. Judgment and thought content normal.  Vitals reviewed.     BP 133/77   Pulse 82   Temp 98.3 F (36.8 C) (Oral)   Ht _0  (1.803 m)   Wt 252 lb (114.3 kg)   BMI 35.15 kg/m      Assessment & Plan:  1. Essential hypertension - CBC with Differential/Platelet - CMP14+EGFR  2. Chronic asthma without complication, unspecified asthma severity, unspecified whether persistent - CBC with Differential/Platelet - CMP14+EGFR  3. Hypothyroidism, unspecified type - CBC with Differential/Platelet - CMP14+EGFR - TSH  4. Arthritis - CBC with Differential/Platelet - CMP14+EGFR - HYDROcodone-acetaminophen (NORCO/VICODIN) 5-325 MG tablet; Take 1 tablet by mouth every 6 (six) hours as needed for moderate pain.  Dispense: 60 tablet; Refill: 0  5. Hyperlipidemia, unspecified hyperlipidemia type - CBC with Differential/Platelet - CMP14+EGFR - Lipid panel  6. Chronic bilateral low back pain, with sciatica presence unspecified - CBC with Differential/Platelet - CMP14+EGFR - HYDROcodone-acetaminophen (NORCO/VICODIN) 5-325 MG tablet; Take 1 tablet by mouth every 6 (six) hours as needed for moderate pain.  Dispense: 60 tablet; Refill: 0  7. Pain medication agreement signed - CBC with Differential/Platelet - CMP14+EGFR - HYDROcodone-acetaminophen  (NORCO/VICODIN) 5-325 MG tablet; Take 1 tablet by mouth every 6 (six) hours as needed for moderate pain.  Dispense: 60 tablet; Refill: 0  8. Uncomplicated opioid dependence (HCC) - CBC with Differential/Platelet - CMP14+EGFR - HYDROcodone-acetaminophen (NORCO/VICODIN) 5-325 MG tablet; Take 1 tablet by mouth every 6 (six) hours as needed for moderate pain.  Dispense: 60 tablet; Refill: 0  9. Anemia, unspecified type - CBC with Differential/Platelet - CMP14+EGFR  10. Chronic bilateral low back pain without sciatica - CBC with Differential/Platelet - CMP14+EGFR - HYDROcodone-acetaminophen (NORCO/VICODIN) 5-325 MG tablet; Take 1 tablet by mouth every 6 (six) hours as needed for moderate pain.  Dispense: 60 tablet; Refill: 0  11. Chronic gout of multiple sites, unspecified cause - CBC with Differential/Platelet - CMP14+EGFR - Uric acid   Continue all meds Labs pending Health Maintenance reviewed-Pt scheduled to have colonoscopy this week   Diet and exercise encouraged RTO 3 months   Evelina Dun, FNP

## 2017-08-26 ENCOUNTER — Other Ambulatory Visit: Payer: Self-pay | Admitting: Family

## 2017-08-26 ENCOUNTER — Telehealth: Payer: Self-pay | Admitting: Internal Medicine

## 2017-08-26 ENCOUNTER — Encounter (HOSPITAL_COMMUNITY): Payer: Self-pay

## 2017-08-26 ENCOUNTER — Encounter (HOSPITAL_COMMUNITY)
Admission: RE | Admit: 2017-08-26 | Discharge: 2017-08-26 | Disposition: A | Discharge status: Home / Self Care | Payer: Medicare Other | Source: Ambulatory Visit | Attending: Internal Medicine | Admitting: Internal Medicine

## 2017-08-26 LAB — CMP14+EGFR
ALK PHOS: 94 IU/L (ref 39–117)
ALT: 8 IU/L (ref 0–44)
AST: 22 IU/L (ref 0–40)
Albumin/Globulin Ratio: 1.3 (ref 1.2–2.2)
Albumin: 4 g/dL (ref 3.6–4.8)
BILIRUBIN TOTAL: 1 mg/dL (ref 0.0–1.2)
BUN/Creatinine Ratio: 6 — ABNORMAL LOW (ref 10–24)
BUN: 9 mg/dL (ref 8–27)
CHLORIDE: 102 mmol/L (ref 96–106)
CO2: 23 mmol/L (ref 20–29)
Calcium: 9.1 mg/dL (ref 8.6–10.2)
Creatinine, Ser: 1.57 mg/dL — ABNORMAL HIGH (ref 0.76–1.27)
GFR calc Af Amer: 51 mL/min/{1.73_m2} — ABNORMAL LOW (ref >59)
GFR calc non Af Amer: 44 mL/min/{1.73_m2} — ABNORMAL LOW (ref >59)
Globulin, Total: 3 g/dL (ref 1.5–4.5)
Glucose: 100 mg/dL — ABNORMAL HIGH (ref 65–99)
POTASSIUM: 4.4 mmol/L (ref 3.5–5.2)
Sodium: 140 mmol/L (ref 134–144)
Total Protein: 7 g/dL (ref 6.0–8.5)

## 2017-08-26 LAB — LIPID PANEL
Chol/HDL Ratio: 2.6 ratio (ref 0.0–5.0)
Cholesterol, Total: 131 mg/dL (ref 100–199)
HDL: 50 mg/dL (ref >39)
LDL Calculated: 65 mg/dL (ref 0–99)
TRIGLYCERIDES: 82 mg/dL (ref 0–149)
VLDL Cholesterol Cal: 16 mg/dL (ref 5–40)

## 2017-08-26 LAB — CBC WITH DIFFERENTIAL/PLATELET
BASOS: 1 %
Basophils Absolute: 0.1 10*3/uL (ref 0.0–0.2)
EOS (ABSOLUTE): 0.6 10*3/uL — AB (ref 0.0–0.4)
Eos: 7 %
Hematocrit: 39.3 % (ref 37.5–51.0)
Hemoglobin: 13.6 g/dL (ref 13.0–17.7)
Immature Grans (Abs): 0.1 10*3/uL (ref 0.0–0.1)
Immature Granulocytes: 1 %
Lymphocytes Absolute: 1.9 10*3/uL (ref 0.7–3.1)
Lymphs: 22 %
MCH: 32.3 pg (ref 26.6–33.0)
MCHC: 34.6 g/dL (ref 31.5–35.7)
MCV: 93 fL (ref 79–97)
MONOS ABS: 0.5 10*3/uL (ref 0.1–0.9)
Monocytes: 6 %
NEUTROS ABS: 5.5 10*3/uL (ref 1.4–7.0)
Neutrophils: 63 %
PLATELETS: 209 10*3/uL (ref 150–379)
RBC: 4.21 x10E6/uL (ref 4.14–5.80)
RDW: 14.8 % (ref 12.3–15.4)
WBC: 8.6 10*3/uL (ref 3.4–10.8)

## 2017-08-26 LAB — URIC ACID: Uric Acid: 3.5 mg/dL — ABNORMAL LOW (ref 3.7–8.6)

## 2017-08-26 LAB — TSH: TSH: 2.01 u[IU]/mL (ref 0.450–4.500)

## 2017-08-26 NOTE — Telephone Encounter (Signed)
Spoke with pt and he reports he has decided he does not want to do the TCS anymore. Patient did not want to r/s. Hoyle Sauer has been made aware Forwarding as an FYI to [facility]

## 2017-08-26 NOTE — Telephone Encounter (Signed)
Pt called to say he changed his mind and doesn't want to have procedure done and to cancel pre op and procedure.

## 2017-08-29 NOTE — Telephone Encounter (Signed)
Noted. Still highly recommend colonoscopy as discussed at office visit. Call if changes his mind.

## 2017-09-01 ENCOUNTER — Ambulatory Visit (HOSPITAL_COMMUNITY): Admission: RE | Admit: 2017-09-01 | Payer: Medicare Other | Source: Ambulatory Visit | Admitting: Internal Medicine

## 2017-09-01 ENCOUNTER — Encounter (HOSPITAL_COMMUNITY): Admission: RE | Payer: Self-pay | Source: Ambulatory Visit

## 2017-09-01 SURGERY — COLONOSCOPY WITH PROPOFOL
Anesthesia: Monitor Anesthesia Care

## 2017-11-02 ENCOUNTER — Other Ambulatory Visit: Payer: Self-pay | Admitting: *Deleted

## 2017-11-02 DIAGNOSIS — J302 Other seasonal allergic rhinitis: Secondary | ICD-10-CM

## 2017-11-02 MED ORDER — LORATADINE 10 MG PO TABS: 10.0000 mg | ORAL_TABLET | Freq: Every day | ORAL | 5 refills | Status: DC

## 2017-11-26 IMAGING — US US ABDOMEN LIMITED
1 series · 14 of 25 positions shown · non-contrast
Comparison: None in PACs

CLINICAL DATA: Two weeks of the elevated bilirubin, history of
hyper lymphedema.

EXAM:
US ABDOMEN LIMITED - RIGHT UPPER QUADRANT

[Series 1: us abdomen limited · 0.16mm/px · 14 of 54 slices shown]
[im 1/54]
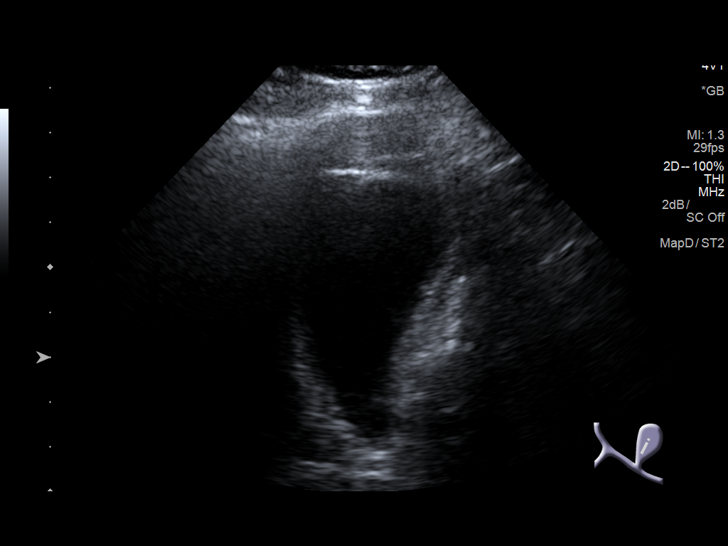
[im 5/54]
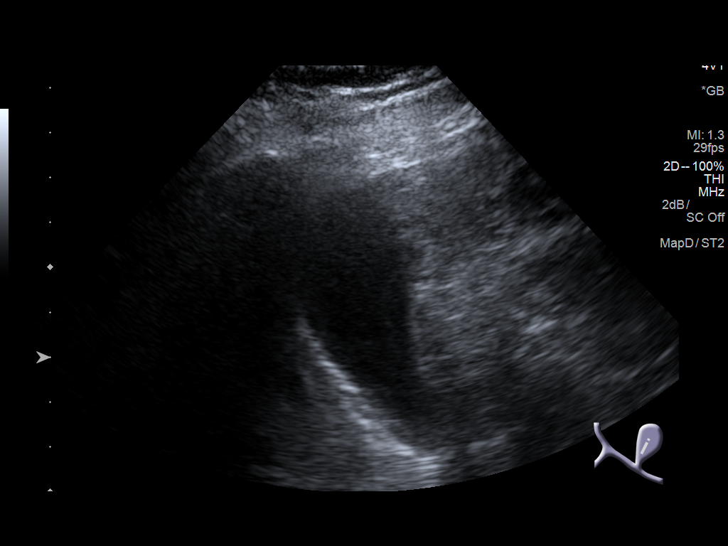
[im 9/54]
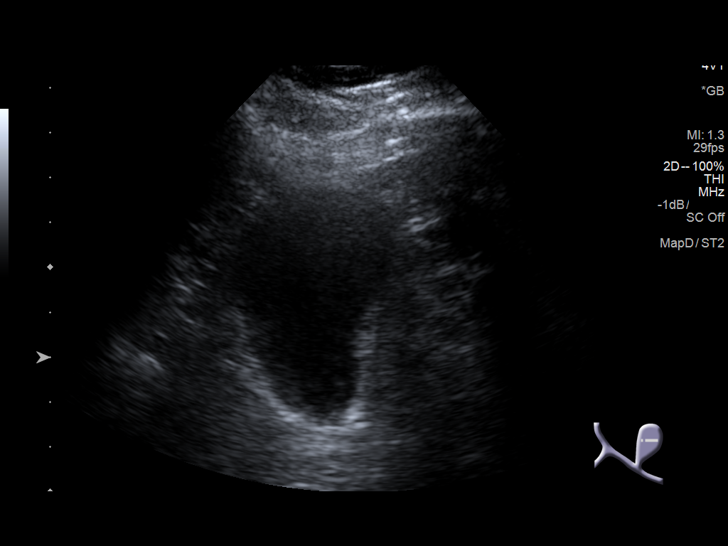
[im 14/54]
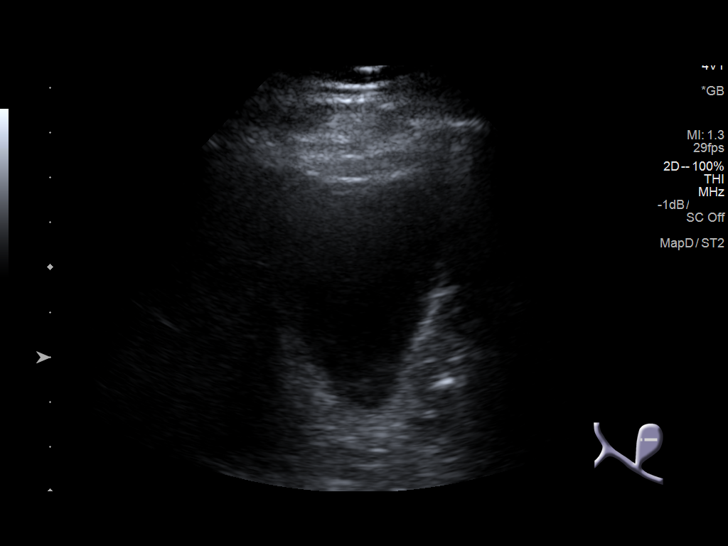
[im 18/54]
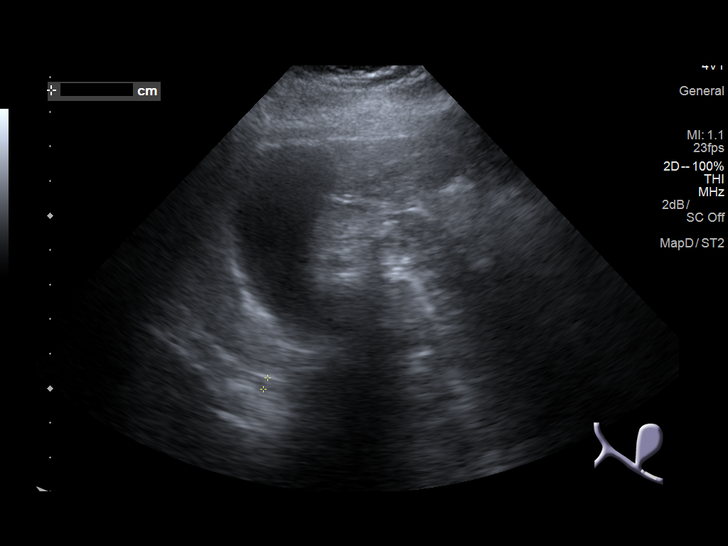
[im 20/54]
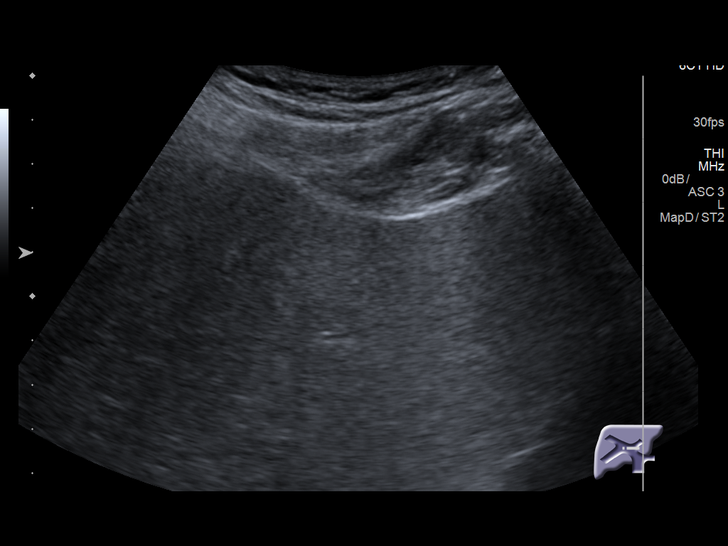
[im 25/54]
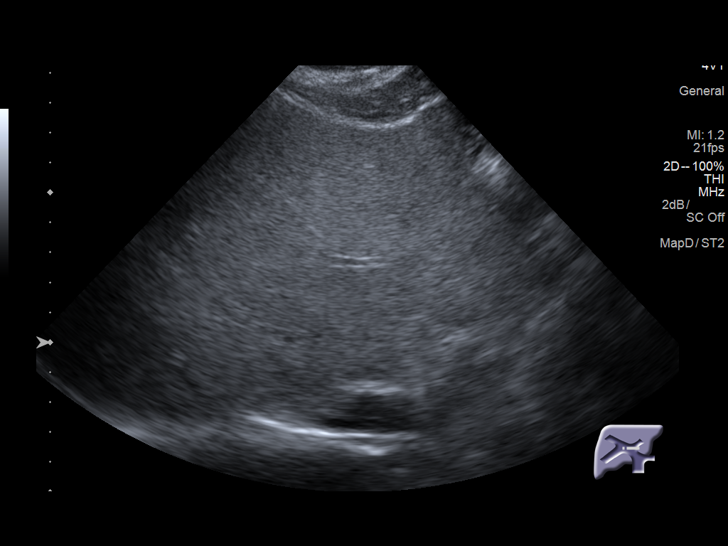
[im 29/54]
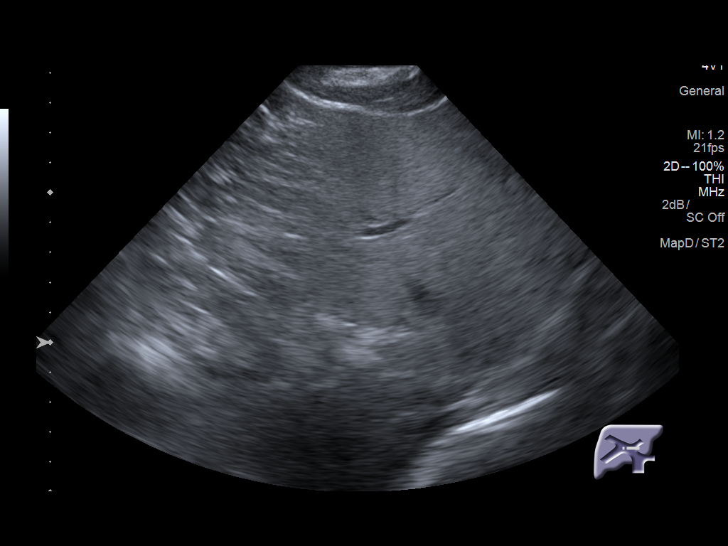
[im 34/54]
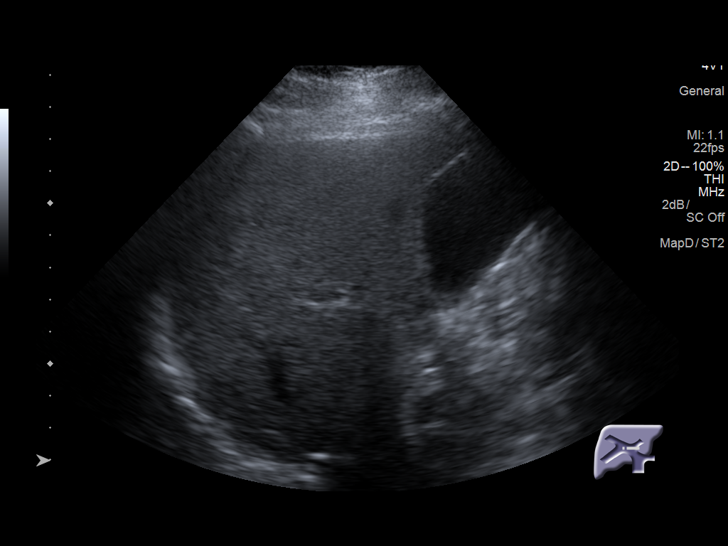
[im 36/54]
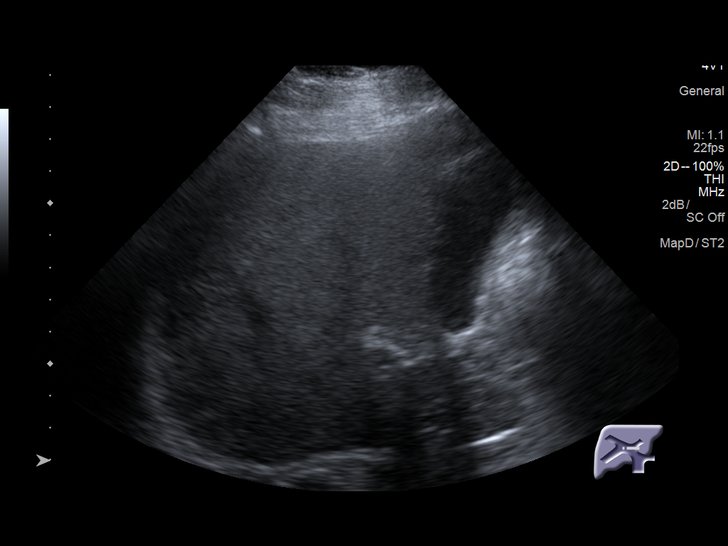
[im 40/54]
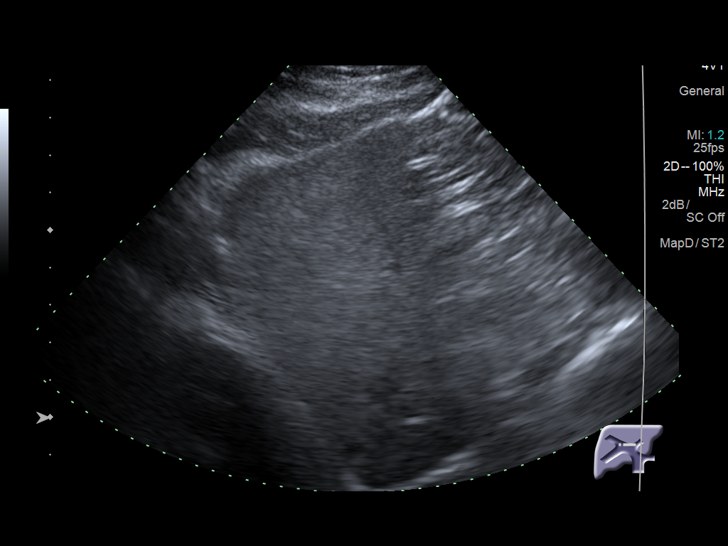
[im 45/54]
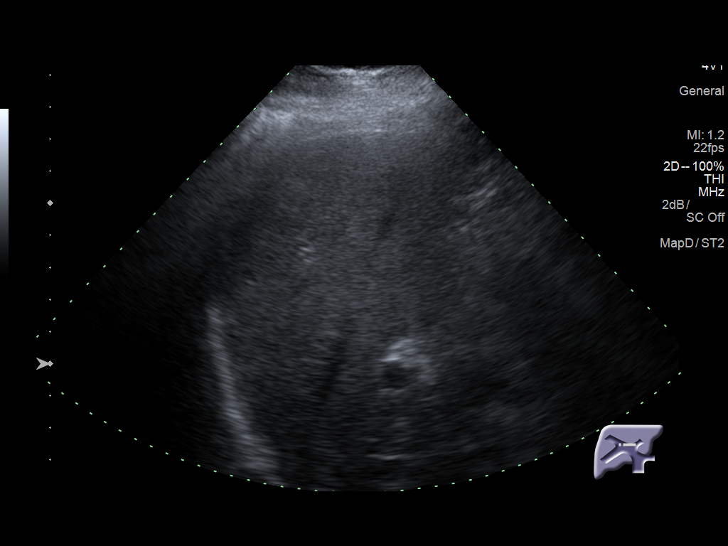
[im 49/54]
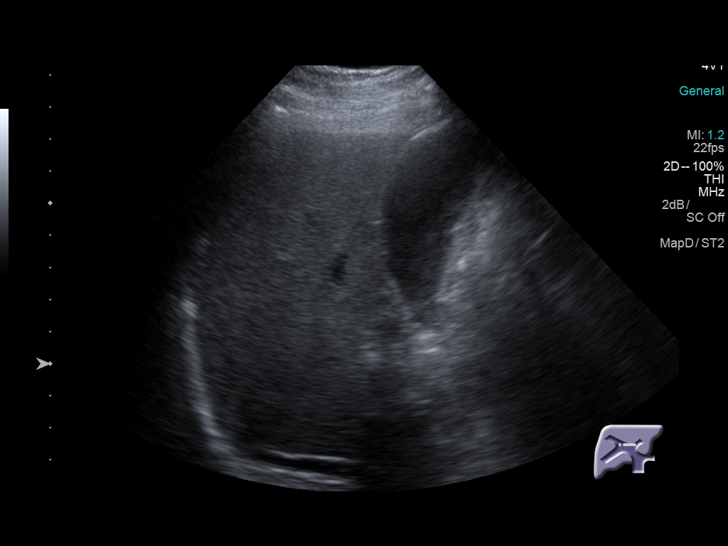
[im 54/54]
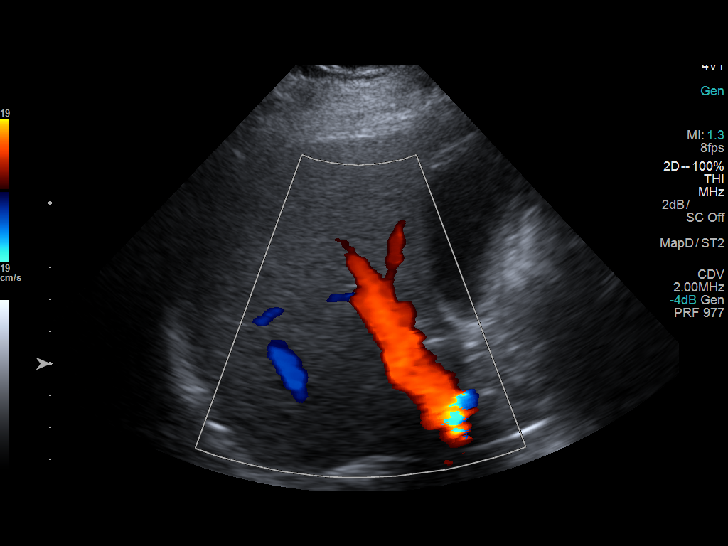

[14 of 25 positions shown; findings below may reference images not displayed]

FINDINGS: Gallbladder:

No gallstones or wall thickening visualized. No sonographic Murphy
sign noted by sonographer.

Common bile duct:

Diameter: 3.5 mm

Liver:

The hepatic echotexture is heterogeneously increased. There is no
focal mass nor ductal dilation. The surface contour of the liver
remains smooth.
IMPRESSION: No gallstones nor sonographic evidence of acute cholecystitis. If
there are clinical concerns of chronic cholecystitis, a nuclear
medicine hepatobiliary scan with gallbladder ejection fraction
determination may be useful.

Probable fatty infiltrative change of the liver.

## 2017-12-01 ENCOUNTER — Other Ambulatory Visit: Payer: Self-pay | Admitting: *Deleted

## 2017-12-01 ENCOUNTER — Ambulatory Visit (INDEPENDENT_AMBULATORY_CARE_PROVIDER_SITE_OTHER): Payer: Medicare Other | Admitting: Family

## 2017-12-01 ENCOUNTER — Encounter: Payer: Self-pay | Admitting: Family

## 2017-12-01 VITALS — BP 136/82 | HR 84 | Temp 97.0°F | Ht 71.0 in | Wt 257.8 lb

## 2017-12-01 DIAGNOSIS — F1120 Opioid dependence, uncomplicated: Secondary | ICD-10-CM | POA: Diagnosis not present

## 2017-12-01 DIAGNOSIS — D649 Anemia, unspecified: Secondary | ICD-10-CM

## 2017-12-01 DIAGNOSIS — E039 Hypothyroidism, unspecified: Secondary | ICD-10-CM

## 2017-12-01 DIAGNOSIS — M1990 Unspecified osteoarthritis, unspecified site: Secondary | ICD-10-CM | POA: Diagnosis not present

## 2017-12-01 DIAGNOSIS — Z1212 Encounter for screening for malignant neoplasm of rectum: Secondary | ICD-10-CM

## 2017-12-01 DIAGNOSIS — E669 Obesity, unspecified: Secondary | ICD-10-CM

## 2017-12-01 DIAGNOSIS — Z0289 Encounter for other administrative examinations: Secondary | ICD-10-CM | POA: Diagnosis not present

## 2017-12-01 DIAGNOSIS — G4700 Insomnia, unspecified: Secondary | ICD-10-CM

## 2017-12-01 DIAGNOSIS — Z1211 Encounter for screening for malignant neoplasm of colon: Secondary | ICD-10-CM

## 2017-12-01 DIAGNOSIS — J45909 Unspecified asthma, uncomplicated: Secondary | ICD-10-CM

## 2017-12-01 DIAGNOSIS — G8929 Other chronic pain: Secondary | ICD-10-CM

## 2017-12-01 DIAGNOSIS — M545 Low back pain: Secondary | ICD-10-CM | POA: Diagnosis not present

## 2017-12-01 DIAGNOSIS — I10 Essential (primary) hypertension: Secondary | ICD-10-CM | POA: Diagnosis not present

## 2017-12-01 DIAGNOSIS — E785 Hyperlipidemia, unspecified: Secondary | ICD-10-CM | POA: Diagnosis not present

## 2017-12-01 MED ORDER — HYDROCODONE-ACETAMINOPHEN 5-325 MG PO TABS: 1.0000 | ORAL_TABLET | Freq: Four times a day (QID) | ORAL | 0 refills | Status: DC | PRN

## 2017-12-01 MED ORDER — ALLOPURINOL 300 MG PO TABS: 300.0000 mg | ORAL_TABLET | Freq: Every day | ORAL | 0 refills | Status: DC

## 2017-12-01 MED ORDER — SIMVASTATIN 20 MG PO TABS: 20.0000 mg | ORAL_TABLET | Freq: Every day | ORAL | 0 refills | Status: DC

## 2017-12-01 NOTE — Patient Instructions (Signed)

## 2017-12-01 NOTE — Progress Notes (Signed)
Subjective:    Patient ID: Andrew Phelps, male    DOB: Nov 13, 1945, 72 y.o.   MRN: [id number]  Chief Complaint  Patient presents with  . Hypertension  . Hyperlipidemia  . pain management    refills   Pt presents to the office today for chronic follow up and pain medication refill. Hypertension  This is a chronic problem. The current episode started more than 1 year ago. The problem has been resolved since onset. The problem is controlled. Pertinent negatives include no headaches, peripheral edema or shortness of breath. Risk factors for coronary artery disease include dyslipidemia, obesity and male gender. The current treatment provides moderate improvement. There is no history of CAD/MI or heart failure. Identifiable causes of hypertension include a thyroid problem.  Hyperlipidemia  This is a chronic problem. The current episode started more than 1 year ago. The problem is controlled. Recent lipid tests were reviewed and are normal. Exacerbating diseases include obesity. Pertinent negatives include no shortness of breath. Current antihyperlipidemic treatment includes statins. The current treatment provides moderate improvement of lipids. Risk factors for coronary artery disease include dyslipidemia, male sex, hypertension and family history.  Thyroid Problem  Presents for follow-up visit. Symptoms include hoarse voice. Patient reports no depressed mood, diarrhea or fatigue. The symptoms have been stable. His past medical history is significant for hyperlipidemia. There is no history of heart failure.  Insomnia  Primary symptoms: no difficulty falling asleep, no frequent awakening.  The current episode started more than one year. The onset quality is gradual. The problem occurs intermittently.  Back Pain  This is a chronic problem. The current episode started more than 1 year ago. The problem occurs intermittently. The problem has been waxing and waning since onset. The pain is  present in the lumbar spine. The quality of the pain is described as aching. The pain is at a severity of 8/10. The pain is moderate. Pertinent negatives include no headaches. He has tried analgesics for the symptoms. The treatment provided moderate relief.  Arthritis  Presents for follow-up visit. He complains of pain and stiffness. The symptoms have been stable. Affected locations include the right knee and left knee. His pain is at a severity of 8/10. Pertinent negatives include no diarrhea or fatigue.      Review of Systems  Constitutional: Negative for fatigue.  HENT: Positive for hoarse voice.   Respiratory: Negative for shortness of breath.   Gastrointestinal: Negative for diarrhea.  Musculoskeletal: Positive for arthritis, back pain and stiffness.  Neurological: Negative for headaches.  Psychiatric/Behavioral: The patient has insomnia.   All other systems reviewed and are negative.      Objective:   Physical Exam  Constitutional: He is oriented to person, place, and time. He appears well-developed and well-nourished. No distress.  HENT:  Head: Normocephalic.  Right Ear: External ear normal.  Left Ear: External ear normal.  Mouth/Throat: Oropharynx is clear and moist.  Eyes: Pupils are equal, round, and reactive to light. Right eye exhibits no discharge. Left eye exhibits no discharge.  Neck: Normal range of motion. Neck supple. No thyromegaly present.  Cardiovascular: Normal rate, regular rhythm, normal heart sounds and intact distal pulses.  No murmur heard. Pulmonary/Chest: Effort normal and breath sounds normal. No respiratory distress. He has no wheezes.  Abdominal: Soft. Bowel sounds are normal. He exhibits no distension. There is no tenderness.  Musculoskeletal: Normal range of motion. He exhibits edema (trace BLE). He exhibits no tenderness.  Neurological: He is alert and  oriented to person, place, and time. He has normal reflexes. No cranial nerve deficit.  Skin:  Skin is warm and dry. No rash noted. No erythema.  Psychiatric: He has a normal mood and affect. His behavior is normal. Judgment and thought content normal.  Vitals reviewed.     BP 136/82   Pulse 84   Temp (!) 97 F (36.1 C) (Oral)   Ht '5\' 11"'$  (1.803 m)   Wt 257 lb 12.8 oz (116.9 kg)   BMI 35.96 kg/m      Assessment & Plan:  Andrew Phelps comes in today with chief complaint of Hypertension; Hyperlipidemia; and pain management (refills)   Diagnosis and orders addressed:  1. Essential hypertension - CMP14+EGFR  2. Chronic asthma without complication, unspecified asthma severity, unspecified whether persistent - CMP14+EGFR  3. Hypothyroidism, unspecified type - CMP14+EGFR  4. Arthritis - CMP14+EGFR - HYDROcodone-acetaminophen (NORCO/VICODIN) 5-325 MG tablet; Take 1 tablet by mouth every 6 (six) hours as needed for moderate pain.  Dispense: 60 tablet; Refill: 0 - HYDROcodone-acetaminophen (NORCO) 5-325 MG tablet; Take 1 tablet by mouth every 6 (six) hours as needed for moderate pain.  Dispense: 60 tablet; Refill: 0 - HYDROcodone-acetaminophen (NORCO) 5-325 MG tablet; Take 1 tablet by mouth every 6 (six) hours as needed for moderate pain.  Dispense: 60 tablet; Refill: 0  5. Hyperlipidemia, unspecified hyperlipidemia type - CMP14+EGFR  6. Chronic bilateral low back pain, with sciatica presence unspecified - CMP14+EGFR - HYDROcodone-acetaminophen (NORCO/VICODIN) 5-325 MG tablet; Take 1 tablet by mouth every 6 (six) hours as needed for moderate pain.  Dispense: 60 tablet; Refill: 0 - HYDROcodone-acetaminophen (NORCO) 5-325 MG tablet; Take 1 tablet by mouth every 6 (six) hours as needed for moderate pain.  Dispense: 60 tablet; Refill: 0 - HYDROcodone-acetaminophen (NORCO) 5-325 MG tablet; Take 1 tablet by mouth every 6 (six) hours as needed for moderate pain.  Dispense: 60 tablet; Refill: 0  7. Anemia, unspecified type - CMP14+EGFR  8. Obesity (BMI 30-39.9) -  CMP14+EGFR  9. Insomnia, unspecified type - CMP14+EGFR  10. Pain medication agreement signed - CMP14+EGFR - HYDROcodone-acetaminophen (NORCO/VICODIN) 5-325 MG tablet; Take 1 tablet by mouth every 6 (six) hours as needed for moderate pain.  Dispense: 60 tablet; Refill: 0 - HYDROcodone-acetaminophen (NORCO) 5-325 MG tablet; Take 1 tablet by mouth every 6 (six) hours as needed for moderate pain.  Dispense: 60 tablet; Refill: 0 - HYDROcodone-acetaminophen (NORCO) 5-325 MG tablet; Take 1 tablet by mouth every 6 (six) hours as needed for moderate pain.  Dispense: 60 tablet; Refill: 0  11. Uncomplicated opioid dependence (HCC) - CMP14+EGFR - HYDROcodone-acetaminophen (NORCO/VICODIN) 5-325 MG tablet; Take 1 tablet by mouth every 6 (six) hours as needed for moderate pain.  Dispense: 60 tablet; Refill: 0 - HYDROcodone-acetaminophen (NORCO) 5-325 MG tablet; Take 1 tablet by mouth every 6 (six) hours as needed for moderate pain.  Dispense: 60 tablet; Refill: 0 - HYDROcodone-acetaminophen (NORCO) 5-325 MG tablet; Take 1 tablet by mouth every 6 (six) hours as needed for moderate pain.  Dispense: 60 tablet; Refill: 0  12. Chronic bilateral low back pain without sciatica - CMP14+EGFR - HYDROcodone-acetaminophen (NORCO/VICODIN) 5-325 MG tablet; Take 1 tablet by mouth every 6 (six) hours as needed for moderate pain.  Dispense: 60 tablet; Refill: 0 - HYDROcodone-acetaminophen (NORCO) 5-325 MG tablet; Take 1 tablet by mouth every 6 (six) hours as needed for moderate pain.  Dispense: 60 tablet; Refill: 0 - HYDROcodone-acetaminophen (NORCO) 5-325 MG tablet; Take 1 tablet by mouth every 6 (six) hours  as needed for moderate pain.  Dispense: 60 tablet; Refill: 0  13. Colon cancer screening - Cologuard  14. Screening for malignant neoplasm of the rectum - Cologuard   Labs pending Health Maintenance reviewed Diet and exercise encouraged Pt reviewed in [place] controlled database. Pt has only received controlled  medications from myself.   Follow up plan: 3 months    Evelina Dun, FNP

## 2017-12-02 LAB — CMP14+EGFR
ALT: 21 IU/L (ref 0–44)
AST: 22 IU/L (ref 0–40)
Albumin/Globulin Ratio: 1.5 (ref 1.2–2.2)
Albumin: 4.2 g/dL (ref 3.5–4.8)
Alkaline Phosphatase: 81 IU/L (ref 39–117)
BILIRUBIN TOTAL: 0.8 mg/dL (ref 0.0–1.2)
BUN/Creatinine Ratio: 8 — ABNORMAL LOW (ref 10–24)
BUN: 13 mg/dL (ref 8–27)
CALCIUM: 9.3 mg/dL (ref 8.6–10.2)
CHLORIDE: 102 mmol/L (ref 96–106)
CO2: 22 mmol/L (ref 20–29)
Creatinine, Ser: 1.64 mg/dL — ABNORMAL HIGH (ref 0.76–1.27)
GFR, EST AFRICAN AMERICAN: 48 mL/min/{1.73_m2} — AB (ref >59)
GFR, EST NON AFRICAN AMERICAN: 42 mL/min/{1.73_m2} — AB (ref >59)
GLUCOSE: 102 mg/dL — AB (ref 65–99)
Globulin, Total: 2.8 g/dL (ref 1.5–4.5)
Potassium: 4.6 mmol/L (ref 3.5–5.2)
Sodium: 140 mmol/L (ref 134–144)
Total Protein: 7 g/dL (ref 6.0–8.5)

## 2018-03-06 ENCOUNTER — Ambulatory Visit (INDEPENDENT_AMBULATORY_CARE_PROVIDER_SITE_OTHER): Payer: Medicare Other | Admitting: Family

## 2018-03-06 ENCOUNTER — Encounter: Payer: Self-pay | Admitting: Family

## 2018-03-06 VITALS — BP 156/85 | HR 72 | Temp 97.6°F | Ht 71.0 in | Wt 272.4 lb

## 2018-03-06 DIAGNOSIS — E039 Hypothyroidism, unspecified: Secondary | ICD-10-CM

## 2018-03-06 DIAGNOSIS — E669 Obesity, unspecified: Secondary | ICD-10-CM

## 2018-03-06 DIAGNOSIS — Z0289 Encounter for other administrative examinations: Secondary | ICD-10-CM

## 2018-03-06 DIAGNOSIS — M545 Low back pain: Secondary | ICD-10-CM

## 2018-03-06 DIAGNOSIS — D649 Anemia, unspecified: Secondary | ICD-10-CM

## 2018-03-06 DIAGNOSIS — I10 Essential (primary) hypertension: Secondary | ICD-10-CM

## 2018-03-06 DIAGNOSIS — J45909 Unspecified asthma, uncomplicated: Secondary | ICD-10-CM

## 2018-03-06 DIAGNOSIS — G6289 Other specified polyneuropathies: Secondary | ICD-10-CM

## 2018-03-06 DIAGNOSIS — G4700 Insomnia, unspecified: Secondary | ICD-10-CM

## 2018-03-06 DIAGNOSIS — E785 Hyperlipidemia, unspecified: Secondary | ICD-10-CM | POA: Diagnosis not present

## 2018-03-06 DIAGNOSIS — J301 Allergic rhinitis due to pollen: Secondary | ICD-10-CM

## 2018-03-06 DIAGNOSIS — F1120 Opioid dependence, uncomplicated: Secondary | ICD-10-CM

## 2018-03-06 DIAGNOSIS — M1990 Unspecified osteoarthritis, unspecified site: Secondary | ICD-10-CM

## 2018-03-06 DIAGNOSIS — R609 Edema, unspecified: Secondary | ICD-10-CM

## 2018-03-06 DIAGNOSIS — G8929 Other chronic pain: Secondary | ICD-10-CM

## 2018-03-06 MED ORDER — HYDROCODONE-ACETAMINOPHEN 5-325 MG PO TABS: 1.0000 | ORAL_TABLET | Freq: Two times a day (BID) | ORAL | 0 refills | Status: DC | PRN

## 2018-03-06 NOTE — Patient Instructions (Signed)

## 2018-03-06 NOTE — Progress Notes (Signed)
Subjective:    Patient ID: Andrew Phelps, male    DOB: 1946-02-10, 72 y.o.   MRN: [id number]  Chief Complaint  Patient presents with  . Medical Management of Chronic Issues    three month recheck    Hypertension  This is a chronic problem. The current episode started more than 1 year ago. The problem has been waxing and waning since onset. The problem is uncontrolled. Associated symptoms include malaise/fatigue and peripheral edema ("sometimes"). Pertinent negatives include no headaches or shortness of breath. The current treatment provides no improvement. There is no history of kidney disease, CAD/MI, CVA or heart failure. Identifiable causes of hypertension include a thyroid problem.  Hyperlipidemia  This is a chronic problem. The current episode started more than 1 year ago. The problem is controlled. Recent lipid tests were reviewed and are normal. Exacerbating diseases include obesity. Pertinent negatives include no leg pain or shortness of breath. Current antihyperlipidemic treatment includes statins. The current treatment provides moderate improvement of lipids. Risk factors for coronary artery disease include dyslipidemia, male sex, hypertension, obesity and a sedentary lifestyle.  Thyroid Problem  Presents for follow-up visit. Symptoms include fatigue. Patient reports no constipation, depressed mood, diaphoresis or diarrhea. The symptoms have been stable. His past medical history is significant for hyperlipidemia. There is no history of heart failure.  Arthritis  Presents for follow-up visit. He complains of pain and stiffness. The symptoms have been stable. Affected locations include the right knee and left knee (back). His pain is at a severity of 8/10. Associated symptoms include fatigue. Pertinent negatives include no diarrhea.  Back Pain  This is a chronic problem. The current episode started more than 1 year ago. The problem occurs intermittently. The problem has been  waxing and waning since onset. The pain is present in the lumbar spine. The quality of the pain is described as aching. The pain is at a severity of 8/10. Pertinent negatives include no bladder incontinence, bowel incontinence, headaches, leg pain or tingling. Risk factors include sedentary lifestyle and obesity. He has tried bed rest and analgesics for the symptoms. The treatment provided mild relief.  Asthma  He complains of frequent throat clearing. There is no cough, difficulty breathing, shortness of breath or wheezing. This is a chronic problem. The current episode started more than 1 year ago. The problem occurs intermittently. The problem has been waxing and waning. Associated symptoms include malaise/fatigue. Pertinent negatives include no headaches. His symptoms are aggravated by pollen. His symptoms are alleviated by rest. He reports minimal improvement on treatment. His past medical history is significant for asthma.  Insomnia  Primary symptoms: difficulty falling asleep, frequent awakening, malaise/fatigue.  The current episode started more than one year. The onset quality is gradual. The problem occurs intermittently. The problem has been waxing and waning since onset. The treatment provided moderate relief.  Peripheral Neuropathy  PT states he has burning,needle pain in bilateral feet and ankle pain. He states gabapentin 600 mg TID that helps slightly.    Review of Systems  Constitutional: Positive for fatigue and malaise/fatigue. Negative for diaphoresis.  Respiratory: Negative for cough, shortness of breath and wheezing.   Gastrointestinal: Negative for bowel incontinence, constipation and diarrhea.  Genitourinary: Negative for bladder incontinence.  Musculoskeletal: Positive for arthritis, back pain and stiffness.  Neurological: Negative for tingling and headaches.  Psychiatric/Behavioral: The patient has insomnia.        Objective:   Physical Exam  Constitutional: He is  oriented to person, place, and  time. He appears well-developed and well-nourished. No distress.  HENT:  Head: Normocephalic.  Right Ear: External ear normal.  Left Ear: External ear normal.  Mouth/Throat: Oropharynx is clear and moist.  Eyes: Pupils are equal, round, and reactive to light. Right eye exhibits no discharge. Left eye exhibits no discharge.  Neck: Normal range of motion. Neck supple. No thyromegaly present.  Cardiovascular: Normal rate, regular rhythm, normal heart sounds and intact distal pulses.  No murmur heard. Pulmonary/Chest: Effort normal and breath sounds normal. No respiratory distress. He has no wheezes.  Abdominal: Soft. Bowel sounds are normal. He exhibits no distension. There is no tenderness.  Musculoskeletal: Normal range of motion. He exhibits edema (trace BLE). He exhibits no tenderness.  Neurological: He is alert and oriented to person, place, and time. He has normal reflexes. No cranial nerve deficit.  Skin: Skin is warm and dry. No rash noted. No erythema.  Psychiatric: He has a normal mood and affect. His behavior is normal. Judgment and thought content normal.  Vitals reviewed.     BP (!) 156/85   Pulse 72   Temp 97.6 F (36.4 C) (Oral)   Ht _0  (1.803 m)   Wt 272 lb 6.4 oz (123.6 kg)   BMI 37.99 kg/m      Assessment & Plan:  Adan Baehr comes in today with chief complaint of Medical Management of Chronic Issues (three month recheck)   Diagnosis and orders addressed:  1. Essential hypertension - CMP14+EGFR - CBC with Differential/Platelet  2. Chronic bilateral low back pain, with sciatica presence unspecified - CMP14+EGFR - CBC with Differential/Platelet - ToxASSURE Select 13 (MW), Urine - HYDROcodone-acetaminophen (NORCO/VICODIN) 5-325 MG tablet; Take 1 tablet by mouth every 12 (twelve) hours as needed for moderate pain.  Dispense: 60 tablet; Refill: 0 - HYDROcodone-acetaminophen (NORCO) 5-325 MG tablet; Take 1 tablet by  mouth every 12 (twelve) hours as needed for moderate pain.  Dispense: 60 tablet; Refill: 0 - HYDROcodone-acetaminophen (NORCO) 5-325 MG tablet; Take 1 tablet by mouth every 12 (twelve) hours as needed for moderate pain.  Dispense: 60 tablet; Refill: 0  3. Hyperlipidemia, unspecified hyperlipidemia type - CMP14+EGFR - CBC with Differential/Platelet - Lipid panel  4. Hypothyroidism, unspecified type - CMP14+EGFR - CBC with Differential/Platelet - TSH  5. Anemia, unspecified type - CMP14+EGFR - CBC with Differential/Platelet  6. Arthritis - CMP14+EGFR - CBC with Differential/Platelet - ToxASSURE Select 13 (MW), Urine - HYDROcodone-acetaminophen (NORCO/VICODIN) 5-325 MG tablet; Take 1 tablet by mouth every 12 (twelve) hours as needed for moderate pain.  Dispense: 60 tablet; Refill: 0 - HYDROcodone-acetaminophen (NORCO) 5-325 MG tablet; Take 1 tablet by mouth every 12 (twelve) hours as needed for moderate pain.  Dispense: 60 tablet; Refill: 0 - HYDROcodone-acetaminophen (NORCO) 5-325 MG tablet; Take 1 tablet by mouth every 12 (twelve) hours as needed for moderate pain.  Dispense: 60 tablet; Refill: 0  7. Obesity (BMI 30-39.9) - CMP14+EGFR - CBC with Differential/Platelet  8. Uncomplicated opioid dependence ([place]) - CMP14+EGFR - CBC with Differential/Platelet - ToxASSURE Select 13 (MW), Urine - HYDROcodone-acetaminophen (NORCO/VICODIN) 5-325 MG tablet; Take 1 tablet by mouth every 12 (twelve) hours as needed for moderate pain.  Dispense: 60 tablet; Refill: 0 - HYDROcodone-acetaminophen (NORCO) 5-325 MG tablet; Take 1 tablet by mouth every 12 (twelve) hours as needed for moderate pain.  Dispense: 60 tablet; Refill: 0 - HYDROcodone-acetaminophen (NORCO) 5-325 MG tablet; Take 1 tablet by mouth every 12 (twelve) hours as needed for moderate pain.  Dispense: 60 tablet; Refill:  0  9. Pain medication agreement signed - CMP14+EGFR - CBC with Differential/Platelet - ToxASSURE Select 13 (MW),  Urine - HYDROcodone-acetaminophen (NORCO/VICODIN) 5-325 MG tablet; Take 1 tablet by mouth every 12 (twelve) hours as needed for moderate pain.  Dispense: 60 tablet; Refill: 0 - HYDROcodone-acetaminophen (NORCO) 5-325 MG tablet; Take 1 tablet by mouth every 12 (twelve) hours as needed for moderate pain.  Dispense: 60 tablet; Refill: 0 - HYDROcodone-acetaminophen (NORCO) 5-325 MG tablet; Take 1 tablet by mouth every 12 (twelve) hours as needed for moderate pain.  Dispense: 60 tablet; Refill: 0  10. Allergic rhinitis due to pollen, unspecified seasonality - CMP14+EGFR - CBC with Differential/Platelet  11. Chronic asthma without complication, unspecified asthma severity, unspecified whether persistent - CMP14+EGFR - CBC with Differential/Platelet  12. Insomnia, unspecified type - CMP14+EGFR - CBC with Differential/Platelet  13. Chronic bilateral low back pain without sciatica - HYDROcodone-acetaminophen (NORCO/VICODIN) 5-325 MG tablet; Take 1 tablet by mouth every 12 (twelve) hours as needed for moderate pain.  Dispense: 60 tablet; Refill: 0 - HYDROcodone-acetaminophen (NORCO) 5-325 MG tablet; Take 1 tablet by mouth every 12 (twelve) hours as needed for moderate pain.  Dispense: 60 tablet; Refill: 0 - HYDROcodone-acetaminophen (NORCO) 5-325 MG tablet; Take 1 tablet by mouth every 12 (twelve) hours as needed for moderate pain.  Dispense: 60 tablet; Refill: 0  14. Other polyneuropathy - Compression stockings  15. Peripheral edema - Compression stockings   Labs pending PT reviewed in [place] controlled database, no red flags noted Health Maintenance reviewed Diet and exercise encouraged  Follow up plan: 3 months for pain medication    Evelina Dun, FNP

## 2018-03-07 ENCOUNTER — Other Ambulatory Visit: Payer: Self-pay | Admitting: Family

## 2018-03-07 LAB — CMP14+EGFR
A/G RATIO: 1.5 (ref 1.2–2.2)
ALT: 19 IU/L (ref 0–44)
AST: 23 IU/L (ref 0–40)
Albumin: 4.1 g/dL (ref 3.5–4.8)
Alkaline Phosphatase: 76 IU/L (ref 39–117)
BILIRUBIN TOTAL: 1.2 mg/dL (ref 0.0–1.2)
BUN / CREAT RATIO: 6 — AB (ref 10–24)
BUN: 9 mg/dL (ref 8–27)
CHLORIDE: 100 mmol/L (ref 96–106)
CO2: 25 mmol/L (ref 20–29)
Calcium: 9.3 mg/dL (ref 8.6–10.2)
Creatinine, Ser: 1.53 mg/dL — ABNORMAL HIGH (ref 0.76–1.27)
GFR, EST AFRICAN AMERICAN: 52 mL/min/{1.73_m2} — AB (ref >59)
GFR, EST NON AFRICAN AMERICAN: 45 mL/min/{1.73_m2} — AB (ref >59)
GLOBULIN, TOTAL: 2.7 g/dL (ref 1.5–4.5)
Glucose: 104 mg/dL — ABNORMAL HIGH (ref 65–99)
POTASSIUM: 4 mmol/L (ref 3.5–5.2)
SODIUM: 142 mmol/L (ref 134–144)
TOTAL PROTEIN: 6.8 g/dL (ref 6.0–8.5)

## 2018-03-07 LAB — CBC WITH DIFFERENTIAL/PLATELET
BASOS ABS: 0.1 10*3/uL (ref 0.0–0.2)
Basos: 1 %
EOS (ABSOLUTE): 0.5 10*3/uL — AB (ref 0.0–0.4)
Eos: 7 %
Hematocrit: 41 % (ref 37.5–51.0)
Hemoglobin: 13.7 g/dL (ref 13.0–17.7)
Immature Grans (Abs): 0.1 10*3/uL (ref 0.0–0.1)
Immature Granulocytes: 1 %
LYMPHS ABS: 2.2 10*3/uL (ref 0.7–3.1)
LYMPHS: 28 %
MCH: 31.4 pg (ref 26.6–33.0)
MCHC: 33.4 g/dL (ref 31.5–35.7)
MCV: 94 fL (ref 79–97)
MONOS ABS: 0.5 10*3/uL (ref 0.1–0.9)
Monocytes: 7 %
NEUTROS ABS: 4.4 10*3/uL (ref 1.4–7.0)
Neutrophils: 56 %
PLATELETS: 188 10*3/uL (ref 150–450)
RBC: 4.37 x10E6/uL (ref 4.14–5.80)
RDW: 13.8 % (ref 12.3–15.4)
WBC: 7.7 10*3/uL (ref 3.4–10.8)

## 2018-03-07 LAB — LIPID PANEL
CHOL/HDL RATIO: 3.2 ratio (ref 0.0–5.0)
CHOLESTEROL TOTAL: 140 mg/dL (ref 100–199)
HDL: 44 mg/dL (ref >39)
LDL Calculated: 73 mg/dL (ref 0–99)
Triglycerides: 116 mg/dL (ref 0–149)
VLDL CHOLESTEROL CAL: 23 mg/dL (ref 5–40)

## 2018-03-07 LAB — TSH: TSH: 7.8 u[IU]/mL — AB (ref 0.450–4.500)

## 2018-03-07 MED ORDER — LEVOTHYROXINE SODIUM 112 MCG PO TABS: 112.0000 ug | ORAL_TABLET | Freq: Every day | ORAL | 11 refills | Status: DC

## 2018-03-10 LAB — TOXASSURE SELECT 13 (MW), URINE

## 2018-03-30 ENCOUNTER — Other Ambulatory Visit: Payer: Self-pay | Admitting: *Deleted

## 2018-03-30 DIAGNOSIS — G6289 Other specified polyneuropathies: Secondary | ICD-10-CM

## 2018-03-30 MED ORDER — GABAPENTIN 600 MG PO TABS: 600.0000 mg | ORAL_TABLET | Freq: Three times a day (TID) | ORAL | 3 refills | Status: DC

## 2018-05-11 ENCOUNTER — Other Ambulatory Visit: Payer: Self-pay | Admitting: *Deleted

## 2018-05-11 DIAGNOSIS — J302 Other seasonal allergic rhinitis: Secondary | ICD-10-CM

## 2018-05-11 MED ORDER — FLUTICASONE PROPIONATE 50 MCG/ACT NA SUSP: 2.0000 | Freq: Every day | NASAL | 5 refills | Status: DC

## 2018-05-11 MED ORDER — LORATADINE 10 MG PO TABS: 10.0000 mg | ORAL_TABLET | Freq: Every day | ORAL | 5 refills | Status: DC

## 2018-06-06 ENCOUNTER — Ambulatory Visit (INDEPENDENT_AMBULATORY_CARE_PROVIDER_SITE_OTHER): Payer: Medicare Other | Admitting: Family

## 2018-06-06 ENCOUNTER — Encounter: Payer: Self-pay | Admitting: Family

## 2018-06-06 VITALS — BP 133/80 | HR 74 | Temp 96.9°F | Ht 71.0 in | Wt 268.6 lb

## 2018-06-06 DIAGNOSIS — E039 Hypothyroidism, unspecified: Secondary | ICD-10-CM

## 2018-06-06 DIAGNOSIS — G6289 Other specified polyneuropathies: Secondary | ICD-10-CM | POA: Diagnosis not present

## 2018-06-06 DIAGNOSIS — I10 Essential (primary) hypertension: Secondary | ICD-10-CM

## 2018-06-06 DIAGNOSIS — M545 Low back pain, unspecified: Secondary | ICD-10-CM

## 2018-06-06 DIAGNOSIS — E785 Hyperlipidemia, unspecified: Secondary | ICD-10-CM

## 2018-06-06 DIAGNOSIS — Z0289 Encounter for other administrative examinations: Secondary | ICD-10-CM

## 2018-06-06 DIAGNOSIS — Z23 Encounter for immunization: Secondary | ICD-10-CM

## 2018-06-06 DIAGNOSIS — J45909 Unspecified asthma, uncomplicated: Secondary | ICD-10-CM | POA: Diagnosis not present

## 2018-06-06 DIAGNOSIS — M1990 Unspecified osteoarthritis, unspecified site: Secondary | ICD-10-CM

## 2018-06-06 DIAGNOSIS — G4700 Insomnia, unspecified: Secondary | ICD-10-CM

## 2018-06-06 DIAGNOSIS — F1120 Opioid dependence, uncomplicated: Secondary | ICD-10-CM

## 2018-06-06 DIAGNOSIS — G8929 Other chronic pain: Secondary | ICD-10-CM

## 2018-06-06 MED ORDER — HYDROCODONE-ACETAMINOPHEN 5-325 MG PO TABS: 1.0000 | ORAL_TABLET | Freq: Two times a day (BID) | ORAL | 0 refills | Status: DC | PRN

## 2018-06-06 NOTE — Patient Instructions (Signed)

## 2018-06-06 NOTE — Progress Notes (Signed)
Subjective:    Patient ID: Andrew Phelps, male    DOB: 02-16-1946, 72 y.o.   MRN: [id number]  Chief Complaint  Patient presents with  . pain management refills   Pt presents to the office today for chronic follow up and pain medication.  Hypertension  This is a chronic problem. The current episode started more than 1 year ago. The problem has been resolved since onset. The problem is controlled. Pertinent negatives include no chest pain, headaches, malaise/fatigue, peripheral edema or shortness of breath. Risk factors for coronary artery disease include dyslipidemia, obesity and male gender. The current treatment provides moderate improvement. There is no history of kidney disease, CAD/MI, CVA or heart failure. Identifiable causes of hypertension include a thyroid problem.  Asthma  He complains of cough and hoarse voice. There is no shortness of breath or wheezing. This is a chronic problem. The current episode started more than 1 year ago. The problem occurs intermittently. Associated symptoms include heartburn. Pertinent negatives include no chest pain, headaches or malaise/fatigue. His symptoms are alleviated by leukotriene antagonist. He reports moderate improvement on treatment. His past medical history is significant for asthma.  Thyroid Problem  Presents for follow-up visit. Symptoms include fatigue and hoarse voice. Patient reports no constipation, diaphoresis or dry skin. The symptoms have been stable. His past medical history is significant for hyperlipidemia. There is no history of heart failure.  Arthritis  Presents for follow-up visit. He complains of pain and stiffness. The symptoms have been stable. Affected locations include the right knee and left knee (back). His pain is at a severity of 9/10. Associated symptoms include fatigue.  Hyperlipidemia  This is a chronic problem. The current episode started more than 1 year ago. The problem is controlled. Recent lipid tests  were reviewed and are normal. Pertinent negatives include no chest pain or shortness of breath. Current antihyperlipidemic treatment includes statins. The current treatment provides moderate improvement of lipids. Risk factors for coronary artery disease include dyslipidemia, male sex, hypertension and a sedentary lifestyle.  Insomnia  Primary symptoms: difficulty falling asleep, frequent awakening, no malaise/fatigue.  The current episode started more than one year. The onset quality is gradual. The problem occurs intermittently.  Gastroesophageal Reflux  He complains of coughing, heartburn and a hoarse voice. He reports no belching, no chest pain or no wheezing. This is a chronic problem. The current episode started more than 1 year ago. The problem occurs occasionally. Associated symptoms include fatigue. He has tried an antacid for the symptoms. The treatment provided mild relief.  Neuropathy Pt reports constatn burning pain that is a 10 out 10. Takes gabapentin that helps a little.     Review of Systems  Constitutional: Positive for fatigue. Negative for diaphoresis and malaise/fatigue.  HENT: Positive for hoarse voice.   Respiratory: Positive for cough. Negative for shortness of breath and wheezing.   Cardiovascular: Negative for chest pain.  Gastrointestinal: Positive for heartburn. Negative for constipation.  Musculoskeletal: Positive for arthritis and stiffness.  Neurological: Negative for headaches.  Psychiatric/Behavioral: The patient has insomnia.   All other systems reviewed and are negative.      Objective:   Physical Exam Vitals signs reviewed.  Constitutional:      General: He is not in acute distress.    Appearance: He is well-developed.  HENT:     Head: Normocephalic.     Nose:     Right Turbinates: Swollen.     Left Turbinates: Swollen.     Mouth/Throat:  Pharynx: Pharyngeal swelling and posterior oropharyngeal erythema present.  Eyes:     General:         Right eye: No discharge.        Left eye: No discharge.     Pupils: Pupils are equal, round, and reactive to light.  Neck:     Musculoskeletal: Normal range of motion and neck supple.     Thyroid: No thyromegaly.  Cardiovascular:     Rate and Rhythm: Normal rate and regular rhythm.     Heart sounds: Normal heart sounds. No murmur.  Pulmonary:     Effort: Pulmonary effort is normal. No respiratory distress.     Breath sounds: Normal breath sounds. No wheezing.  Abdominal:     General: Bowel sounds are normal. There is no distension.     Palpations: Abdomen is soft.     Tenderness: There is no abdominal tenderness.  Musculoskeletal: Normal range of motion.        General: No tenderness.  Skin:    General: Skin is warm and dry.     Findings: No erythema or rash.  Neurological:     Mental Status: He is alert and oriented to person, place, and time.     Cranial Nerves: No cranial nerve deficit.     Deep Tendon Reflexes: Reflexes are normal and symmetric.  Psychiatric:        Behavior: Behavior normal.        Thought Content: Thought content normal.        Judgment: Judgment normal.       BP 133/80   Pulse 74   Temp (!) 96.9 F (36.1 C) (Oral)   Ht _0  (1.803 m)   Wt 268 lb 9.6 oz (121.8 kg)   BMI 37.46 kg/m      Assessment & Plan:  Andrew Phelps comes in today with chief complaint of pain management refills   Diagnosis and orders addressed:  1. Essential hypertension - CMP14+EGFR - CBC with Differential/Platelet  2. Chronic asthma without complication, unspecified asthma severity, unspecified whether persistent - CMP14+EGFR - CBC with Differential/Platelet  3. Hypothyroidism, unspecified type - CMP14+EGFR - CBC with Differential/Platelet - TSH  4. Other polyneuropathy - CMP14+EGFR - CBC with Differential/Platelet  5. Arthritis - CMP14+EGFR - CBC with Differential/Platelet - HYDROcodone-acetaminophen (NORCO) 5-325 MG tablet; Take 1 tablet  by mouth every 12 (twelve) hours as needed for moderate pain.  Dispense: 60 tablet; Refill: 0 - HYDROcodone-acetaminophen (NORCO) 5-325 MG tablet; Take 1 tablet by mouth every 12 (twelve) hours as needed for moderate pain.  Dispense: 60 tablet; Refill: 0 - HYDROcodone-acetaminophen (NORCO/VICODIN) 5-325 MG tablet; Take 1 tablet by mouth every 12 (twelve) hours as needed for moderate pain.  Dispense: 60 tablet; Refill: 0  6. Insomnia, unspecified type - CMP14+EGFR - CBC with Differential/Platelet  7. Hyperlipidemia, unspecified hyperlipidemia type - CMP14+EGFR - CBC with Differential/Platelet  8. Chronic bilateral low back pain, unspecified whether sciatica present - CMP14+EGFR - CBC with Differential/Platelet  9. Pain medication agreement signed - CMP14+EGFR - CBC with Differential/Platelet - HYDROcodone-acetaminophen (NORCO) 5-325 MG tablet; Take 1 tablet by mouth every 12 (twelve) hours as needed for moderate pain.  Dispense: 60 tablet; Refill: 0 - HYDROcodone-acetaminophen (NORCO) 5-325 MG tablet; Take 1 tablet by mouth every 12 (twelve) hours as needed for moderate pain.  Dispense: 60 tablet; Refill: 0 - HYDROcodone-acetaminophen (NORCO/VICODIN) 5-325 MG tablet; Take 1 tablet by mouth every 12 (twelve) hours as needed for moderate pain.  Dispense:  60 tablet; Refill: 0  10. Uncomplicated opioid dependence ([place]) - CMP14+EGFR - CBC with Differential/Platelet - HYDROcodone-acetaminophen (NORCO) 5-325 MG tablet; Take 1 tablet by mouth every 12 (twelve) hours as needed for moderate pain.  Dispense: 60 tablet; Refill: 0 - HYDROcodone-acetaminophen (NORCO) 5-325 MG tablet; Take 1 tablet by mouth every 12 (twelve) hours as needed for moderate pain.  Dispense: 60 tablet; Refill: 0 - HYDROcodone-acetaminophen (NORCO/VICODIN) 5-325 MG tablet; Take 1 tablet by mouth every 12 (twelve) hours as needed for moderate pain.  Dispense: 60 tablet; Refill: 0  11. Chronic bilateral low back pain -  HYDROcodone-acetaminophen (NORCO) 5-325 MG tablet; Take 1 tablet by mouth every 12 (twelve) hours as needed for moderate pain.  Dispense: 60 tablet; Refill: 0 - HYDROcodone-acetaminophen (NORCO) 5-325 MG tablet; Take 1 tablet by mouth every 12 (twelve) hours as needed for moderate pain.  Dispense: 60 tablet; Refill: 0 - HYDROcodone-acetaminophen (NORCO/VICODIN) 5-325 MG tablet; Take 1 tablet by mouth every 12 (twelve) hours as needed for moderate pain.  Dispense: 60 tablet; Refill: 0  12. Chronic bilateral low back pain without sciatica - HYDROcodone-acetaminophen (NORCO) 5-325 MG tablet; Take 1 tablet by mouth every 12 (twelve) hours as needed for moderate pain.  Dispense: 60 tablet; Refill: 0 - HYDROcodone-acetaminophen (NORCO) 5-325 MG tablet; Take 1 tablet by mouth every 12 (twelve) hours as needed for moderate pain.  Dispense: 60 tablet; Refill: 0 - HYDROcodone-acetaminophen (NORCO/VICODIN) 5-325 MG tablet; Take 1 tablet by mouth every 12 (twelve) hours as needed for moderate pain.  Dispense: 60 tablet; Refill: 0   Labs pending Health Maintenance reviewed Diet and exercise encouraged  Follow up plan: 3 months    Evelina Dun, FNP

## 2018-06-07 ENCOUNTER — Other Ambulatory Visit: Payer: Self-pay | Admitting: Family

## 2018-06-07 ENCOUNTER — Other Ambulatory Visit: Payer: Self-pay | Admitting: Family Medicine

## 2018-06-07 DIAGNOSIS — R7989 Other specified abnormal findings of blood chemistry: Secondary | ICD-10-CM

## 2018-06-07 LAB — CBC WITH DIFFERENTIAL/PLATELET
BASOS ABS: 0.1 10*3/uL (ref 0.0–0.2)
BASOS: 1 %
EOS (ABSOLUTE): 0.4 10*3/uL (ref 0.0–0.4)
Eos: 6 %
Hematocrit: 41 % (ref 37.5–51.0)
Hemoglobin: 13.7 g/dL (ref 13.0–17.7)
IMMATURE GRANS (ABS): 0 10*3/uL (ref 0.0–0.1)
IMMATURE GRANULOCYTES: 1 %
LYMPHS: 22 %
Lymphocytes Absolute: 1.8 10*3/uL (ref 0.7–3.1)
MCH: 31.4 pg (ref 26.6–33.0)
MCHC: 33.4 g/dL (ref 31.5–35.7)
MCV: 94 fL (ref 79–97)
Monocytes Absolute: 0.5 10*3/uL (ref 0.1–0.9)
Monocytes: 6 %
NEUTROS PCT: 64 %
Neutrophils Absolute: 5 10*3/uL (ref 1.4–7.0)
PLATELETS: 198 10*3/uL (ref 150–450)
RBC: 4.36 x10E6/uL (ref 4.14–5.80)
RDW: 13.8 % (ref 12.3–15.4)
WBC: 7.8 10*3/uL (ref 3.4–10.8)

## 2018-06-07 LAB — CMP14+EGFR
A/G RATIO: 1.4 (ref 1.2–2.2)
ALT: 18 IU/L (ref 0–44)
AST: 23 IU/L (ref 0–40)
Albumin: 4.2 g/dL (ref 3.5–4.8)
Alkaline Phosphatase: 94 IU/L (ref 39–117)
BUN/Creatinine Ratio: 7 — ABNORMAL LOW (ref 10–24)
BUN: 13 mg/dL (ref 8–27)
Bilirubin Total: 1 mg/dL (ref 0.0–1.2)
CHLORIDE: 100 mmol/L (ref 96–106)
CO2: 22 mmol/L (ref 20–29)
Calcium: 8.6 mg/dL (ref 8.6–10.2)
Creatinine, Ser: 1.88 mg/dL — ABNORMAL HIGH (ref 0.76–1.27)
GFR calc Af Amer: 41 mL/min/{1.73_m2} — ABNORMAL LOW (ref >59)
GFR, EST NON AFRICAN AMERICAN: 35 mL/min/{1.73_m2} — AB (ref >59)
Globulin, Total: 2.9 g/dL (ref 1.5–4.5)
Glucose: 91 mg/dL (ref 65–99)
POTASSIUM: 4.3 mmol/L (ref 3.5–5.2)
Sodium: 138 mmol/L (ref 134–144)
Total Protein: 7.1 g/dL (ref 6.0–8.5)

## 2018-06-07 LAB — TSH: TSH: 6.82 u[IU]/mL — AB (ref 0.450–4.500)

## 2018-06-07 MED ORDER — LEVOTHYROXINE SODIUM 125 MCG PO TABS: 125.0000 ug | ORAL_TABLET | Freq: Every day | ORAL | 3 refills | Status: DC

## 2018-06-08 ENCOUNTER — Other Ambulatory Visit: Payer: Self-pay | Admitting: *Deleted

## 2018-06-08 MED ORDER — SIMVASTATIN 20 MG PO TABS: 20.0000 mg | ORAL_TABLET | Freq: Every day | ORAL | 0 refills | Status: DC

## 2018-07-07 ENCOUNTER — Other Ambulatory Visit: Payer: Self-pay | Admitting: *Deleted

## 2018-07-07 DIAGNOSIS — G6289 Other specified polyneuropathies: Secondary | ICD-10-CM

## 2018-07-07 MED ORDER — GABAPENTIN 600 MG PO TABS: 600.0000 mg | ORAL_TABLET | Freq: Three times a day (TID) | ORAL | 1 refills | Status: DC

## 2018-08-25 ENCOUNTER — Other Ambulatory Visit: Payer: Self-pay | Admitting: *Deleted

## 2018-08-25 DIAGNOSIS — M545 Low back pain: Secondary | ICD-10-CM

## 2018-08-25 DIAGNOSIS — M1990 Unspecified osteoarthritis, unspecified site: Secondary | ICD-10-CM

## 2018-08-25 DIAGNOSIS — Z0289 Encounter for other administrative examinations: Secondary | ICD-10-CM

## 2018-08-25 DIAGNOSIS — G8929 Other chronic pain: Secondary | ICD-10-CM

## 2018-08-25 DIAGNOSIS — F1120 Opioid dependence, uncomplicated: Secondary | ICD-10-CM

## 2018-08-25 MED ORDER — MONTELUKAST SODIUM 10 MG PO TABS: 10.0000 mg | ORAL_TABLET | Freq: Every day | ORAL | 0 refills | Status: DC

## 2018-08-25 MED ORDER — ALLOPURINOL 300 MG PO TABS: 300.0000 mg | ORAL_TABLET | Freq: Every day | ORAL | 0 refills | Status: DC

## 2018-08-25 MED ORDER — SIMVASTATIN 20 MG PO TABS: 20.0000 mg | ORAL_TABLET | Freq: Every day | ORAL | 0 refills | Status: DC

## 2018-09-07 ENCOUNTER — Encounter: Payer: Self-pay | Admitting: Family

## 2018-09-07 ENCOUNTER — Other Ambulatory Visit: Payer: Self-pay

## 2018-09-07 ENCOUNTER — Ambulatory Visit (INDEPENDENT_AMBULATORY_CARE_PROVIDER_SITE_OTHER): Payer: Medicare Other | Admitting: Family

## 2018-09-07 VITALS — BP 140/79 | HR 90 | Temp 98.0°F | Ht 71.0 in | Wt 273.6 lb

## 2018-09-07 DIAGNOSIS — E039 Hypothyroidism, unspecified: Secondary | ICD-10-CM

## 2018-09-07 DIAGNOSIS — E785 Hyperlipidemia, unspecified: Secondary | ICD-10-CM | POA: Diagnosis not present

## 2018-09-07 DIAGNOSIS — G8929 Other chronic pain: Secondary | ICD-10-CM

## 2018-09-07 DIAGNOSIS — Z0289 Encounter for other administrative examinations: Secondary | ICD-10-CM

## 2018-09-07 DIAGNOSIS — J45909 Unspecified asthma, uncomplicated: Secondary | ICD-10-CM

## 2018-09-07 DIAGNOSIS — M545 Low back pain, unspecified: Secondary | ICD-10-CM

## 2018-09-07 DIAGNOSIS — G4700 Insomnia, unspecified: Secondary | ICD-10-CM

## 2018-09-07 DIAGNOSIS — I10 Essential (primary) hypertension: Secondary | ICD-10-CM

## 2018-09-07 DIAGNOSIS — E669 Obesity, unspecified: Secondary | ICD-10-CM

## 2018-09-07 DIAGNOSIS — F1120 Opioid dependence, uncomplicated: Secondary | ICD-10-CM

## 2018-09-07 NOTE — Patient Instructions (Signed)

## 2018-09-07 NOTE — Progress Notes (Signed)
Subjective:    Patient ID: Andrew Phelps, male    DOB: 01/11/1946, 73 y.o.   MRN: [id number]  Chief Complaint  Patient presents with  . Medical Management of Chronic Issues    three month recheck   Pt presents to the office today for chronic follow up and pain medication.  Hypertension  This is a chronic problem. The current episode started more than 1 year ago. The problem has been resolved since onset. The problem is controlled. Pertinent negatives include no chest pain, headaches, malaise/fatigue, peripheral edema or shortness of breath. Risk factors for coronary artery disease include dyslipidemia, obesity, male gender and sedentary lifestyle. The current treatment provides moderate improvement. There is no history of kidney disease, CAD/MI or heart failure. Identifiable causes of hypertension include a thyroid problem.  Asthma  He complains of cough and hoarse voice. There is no shortness of breath or wheezing. This is a chronic problem. The current episode started more than 1 year ago. The problem occurs intermittently. The problem has been waxing and waning. The cough is non-productive. Pertinent negatives include no chest pain, headaches or malaise/fatigue. His symptoms are alleviated by leukotriene antagonist. He reports moderate improvement on treatment. His past medical history is significant for asthma.  Thyroid Problem  Presents for follow-up visit. Symptoms include hoarse voice. Patient reports no depressed mood, diarrhea or fatigue. The symptoms have been stable. His past medical history is significant for hyperlipidemia. There is no history of heart failure.  Arthritis  Presents for follow-up visit. He complains of pain and stiffness. The symptoms have been stable. Affected locations include the right MCP and left MCP. His pain is at a severity of 9/10. Pertinent negatives include no diarrhea or fatigue.  Hyperlipidemia  This is a chronic problem. The current episode  started more than 1 year ago. The problem is controlled. Recent lipid tests were reviewed and are normal. Exacerbating diseases include obesity. Pertinent negatives include no chest pain or shortness of breath. Current antihyperlipidemic treatment includes statins. The current treatment provides moderate improvement of lipids. Risk factors for coronary artery disease include dyslipidemia, male sex, hypertension, post-menopausal and a sedentary lifestyle.  Back Pain  This is a chronic problem. The current episode started more than 1 year ago. The problem occurs intermittently. The problem has been waxing and waning since onset. The pain is at a severity of 8/10. The pain is moderate. The symptoms are aggravated by standing. Pertinent negatives include no bladder incontinence, bowel incontinence, chest pain or headaches. The treatment provided moderate relief.      Review of Systems  Constitutional: Negative for fatigue and malaise/fatigue.  HENT: Positive for hoarse voice.   Respiratory: Positive for cough. Negative for shortness of breath and wheezing.   Cardiovascular: Negative for chest pain.  Gastrointestinal: Negative for bowel incontinence and diarrhea.  Genitourinary: Negative for bladder incontinence.  Musculoskeletal: Positive for arthritis, back pain and stiffness.  Neurological: Negative for headaches.  All other systems reviewed and are negative.      Objective:   Physical Exam Vitals signs reviewed.  Constitutional:      General: He is not in acute distress.    Appearance: He is well-developed.  HENT:     Head: Normocephalic.     Right Ear: Tympanic membrane normal.     Left Ear: Tympanic membrane normal.     Mouth/Throat:     Pharynx: Posterior oropharyngeal erythema present.  Eyes:     General:  Right eye: No discharge.        Left eye: No discharge.     Pupils: Pupils are equal, round, and reactive to light.  Neck:     Musculoskeletal: Normal range of motion  and neck supple.     Thyroid: No thyromegaly.  Cardiovascular:     Rate and Rhythm: Normal rate and regular rhythm.     Heart sounds: Normal heart sounds. No murmur.  Pulmonary:     Effort: Pulmonary effort is normal. No respiratory distress.     Breath sounds: Normal breath sounds. No wheezing.  Abdominal:     General: Bowel sounds are normal. There is no distension.     Palpations: Abdomen is soft.     Tenderness: There is no abdominal tenderness.  Musculoskeletal:        General: No tenderness.     Comments: Pain in lower lumbar with flexion  Skin:    General: Skin is warm and dry.     Findings: No erythema or rash.  Neurological:     Mental Status: He is alert and oriented to person, place, and time.     Cranial Nerves: No cranial nerve deficit.     Deep Tendon Reflexes: Reflexes are normal and symmetric.  Psychiatric:        Behavior: Behavior normal.        Thought Content: Thought content normal.        Judgment: Judgment normal.       BP 140/79   Pulse 90   Temp 98 F (36.7 C) (Oral)   Ht '5\' 11"'$  (1.803 m)   Wt 273 lb 9.6 oz (124.1 kg)   BMI 38.16 kg/m      Assessment & Plan:  Andrew Phelps comes in today with chief complaint of Medical Management of Chronic Issues (three month recheck)   Diagnosis and orders addressed:  1. Essential hypertension - CMP14+EGFR - CBC with Differential/Platelet  2. Chronic asthma without complication, unspecified asthma severity, unspecified whether persistent - CMP14+EGFR - CBC with Differential/Platelet  3. Hypothyroidism, unspecified type - CMP14+EGFR - TSH - CBC with Differential/Platelet  4. Hyperlipidemia, unspecified hyperlipidemia type - CMP14+EGFR - CBC with Differential/Platelet  5. Insomnia, unspecified type - CMP14+EGFR - CBC with Differential/Platelet  6. Chronic bilateral low back pain, unspecified whether sciatica present - CMP14+EGFR - CBC with Differential/Platelet  7.  Uncomplicated opioid dependence ([place]) - CMP14+EGFR - CBC with Differential/Platelet - ToxASSURE Select 13 (MW), Urine  8. Pain medication agreement signed - CMP14+EGFR - CBC with Differential/Platelet - ToxASSURE Select 13 (MW), Urine  9. Obesity (BMI 30-39.9) - CMP14+EGFR - CBC with Differential/Platelet   Labs pending PT reviewed in [place] controlled database- No red flags noted Health Maintenance reviewed Diet and exercise encouraged  Follow up plan: 3 months   Evelina Dun, FNP

## 2018-09-08 LAB — CBC WITH DIFFERENTIAL/PLATELET
Basophils Absolute: 0.1 10*3/uL (ref 0.0–0.2)
Basos: 1 %
EOS (ABSOLUTE): 0.4 10*3/uL (ref 0.0–0.4)
Eos: 6 %
Hematocrit: 42.5 % (ref 37.5–51.0)
Hemoglobin: 14.1 g/dL (ref 13.0–17.7)
IMMATURE GRANULOCYTES: 1 %
Immature Grans (Abs): 0.1 10*3/uL (ref 0.0–0.1)
Lymphocytes Absolute: 1.9 10*3/uL (ref 0.7–3.1)
Lymphs: 27 %
MCH: 31.1 pg (ref 26.6–33.0)
MCHC: 33.2 g/dL (ref 31.5–35.7)
MCV: 94 fL (ref 79–97)
Monocytes Absolute: 0.5 10*3/uL (ref 0.1–0.9)
Monocytes: 7 %
Neutrophils Absolute: 4 10*3/uL (ref 1.4–7.0)
Neutrophils: 58 %
Platelets: 189 10*3/uL (ref 150–450)
RBC: 4.54 x10E6/uL (ref 4.14–5.80)
RDW: 13.9 % (ref 11.6–15.4)
WBC: 7 10*3/uL (ref 3.4–10.8)

## 2018-09-08 LAB — CMP14+EGFR
ALT: 22 IU/L (ref 0–44)
AST: 25 IU/L (ref 0–40)
Albumin/Globulin Ratio: 1.4 (ref 1.2–2.2)
Albumin: 4.2 g/dL (ref 3.8–4.8)
Alkaline Phosphatase: 96 IU/L (ref 39–117)
BUN/Creatinine Ratio: 8 — ABNORMAL LOW (ref 10–24)
BUN: 12 mg/dL (ref 8–27)
Bilirubin Total: 0.9 mg/dL (ref 0.0–1.2)
CALCIUM: 9.2 mg/dL (ref 8.6–10.2)
CO2: 22 mmol/L (ref 20–29)
Chloride: 99 mmol/L (ref 96–106)
Creatinine, Ser: 1.45 mg/dL — ABNORMAL HIGH (ref 0.76–1.27)
GFR calc Af Amer: 56 mL/min/{1.73_m2} — ABNORMAL LOW (ref >59)
GFR calc non Af Amer: 48 mL/min/{1.73_m2} — ABNORMAL LOW (ref >59)
Globulin, Total: 3 g/dL (ref 1.5–4.5)
Glucose: 105 mg/dL — ABNORMAL HIGH (ref 65–99)
Potassium: 4.7 mmol/L (ref 3.5–5.2)
Sodium: 136 mmol/L (ref 134–144)
Total Protein: 7.2 g/dL (ref 6.0–8.5)

## 2018-09-08 LAB — TSH: TSH: 0.772 u[IU]/mL (ref 0.450–4.500)

## 2018-09-09 ENCOUNTER — Telehealth: Payer: Self-pay

## 2018-09-09 DIAGNOSIS — M545 Low back pain, unspecified: Secondary | ICD-10-CM

## 2018-09-09 DIAGNOSIS — M1990 Unspecified osteoarthritis, unspecified site: Secondary | ICD-10-CM

## 2018-09-09 DIAGNOSIS — G8929 Other chronic pain: Secondary | ICD-10-CM

## 2018-09-09 DIAGNOSIS — F1120 Opioid dependence, uncomplicated: Secondary | ICD-10-CM

## 2018-09-09 DIAGNOSIS — Z0289 Encounter for other administrative examinations: Secondary | ICD-10-CM

## 2018-09-09 NOTE — Telephone Encounter (Signed)
Patient was seen 3/18-Norco was sent in-= Please advise

## 2018-09-11 MED ORDER — HYDROCODONE-ACETAMINOPHEN 5-325 MG PO TABS: 1.0000 | ORAL_TABLET | Freq: Two times a day (BID) | ORAL | 0 refills | Status: DC | PRN

## 2018-09-11 NOTE — Telephone Encounter (Signed)
Prescription sent to pharmacy.

## 2018-09-11 NOTE — Addendum Note (Signed)
Addended by: Evelina Dun A on: 09/11/2018 10:24 AM   Modules accepted: Orders

## 2018-10-03 ENCOUNTER — Other Ambulatory Visit: Payer: Self-pay | Admitting: Family

## 2018-10-03 ENCOUNTER — Telehealth: Payer: Self-pay | Admitting: Family

## 2018-10-03 DIAGNOSIS — G6289 Other specified polyneuropathies: Secondary | ICD-10-CM

## 2018-10-03 NOTE — Telephone Encounter (Signed)
What is the name of the medication? gabapentin  Have you contacted your pharmacy to request a refill? Not able to send electronically  Which pharmacy would you like this sent to? [facility]   Patient notified that their request is being sent to the clinical staff for review and that they should receive a call once it is complete. If they do not receive a call within 24 hours they can check with their pharmacy or our office.

## 2018-10-03 NOTE — Telephone Encounter (Signed)
Requesting refill on gabapentin

## 2018-10-04 ENCOUNTER — Other Ambulatory Visit: Payer: Self-pay | Admitting: Family

## 2018-10-04 DIAGNOSIS — G6289 Other specified polyneuropathies: Secondary | ICD-10-CM

## 2018-10-04 MED ORDER — GABAPENTIN 600 MG PO TABS: 600.0000 mg | ORAL_TABLET | Freq: Three times a day (TID) | ORAL | 1 refills | Status: DC

## 2018-10-04 NOTE — Progress Notes (Signed)
Prescription sent to pharmacy.

## 2018-10-05 ENCOUNTER — Other Ambulatory Visit: Payer: Self-pay | Admitting: Family

## 2018-10-05 DIAGNOSIS — J302 Other seasonal allergic rhinitis: Secondary | ICD-10-CM

## 2018-10-28 ENCOUNTER — Other Ambulatory Visit: Payer: Self-pay | Admitting: Family

## 2018-11-02 ENCOUNTER — Other Ambulatory Visit: Payer: Self-pay | Admitting: Family

## 2018-11-02 DIAGNOSIS — G6289 Other specified polyneuropathies: Secondary | ICD-10-CM

## 2018-11-02 DIAGNOSIS — J302 Other seasonal allergic rhinitis: Secondary | ICD-10-CM

## 2018-11-30 ENCOUNTER — Other Ambulatory Visit: Payer: Self-pay | Admitting: Family

## 2018-12-12 ENCOUNTER — Encounter: Payer: Self-pay | Admitting: Family

## 2018-12-12 ENCOUNTER — Ambulatory Visit (INDEPENDENT_AMBULATORY_CARE_PROVIDER_SITE_OTHER): Payer: Medicare Other | Admitting: Family

## 2018-12-12 ENCOUNTER — Other Ambulatory Visit: Payer: Self-pay

## 2018-12-12 VITALS — BP 135/75 | HR 73 | Temp 97.0°F | Ht 71.0 in | Wt 271.8 lb

## 2018-12-12 DIAGNOSIS — F1120 Opioid dependence, uncomplicated: Secondary | ICD-10-CM

## 2018-12-12 DIAGNOSIS — I10 Essential (primary) hypertension: Secondary | ICD-10-CM | POA: Diagnosis not present

## 2018-12-12 DIAGNOSIS — M1990 Unspecified osteoarthritis, unspecified site: Secondary | ICD-10-CM

## 2018-12-12 DIAGNOSIS — J45909 Unspecified asthma, uncomplicated: Secondary | ICD-10-CM | POA: Diagnosis not present

## 2018-12-12 DIAGNOSIS — Z0289 Encounter for other administrative examinations: Secondary | ICD-10-CM

## 2018-12-12 DIAGNOSIS — E669 Obesity, unspecified: Secondary | ICD-10-CM

## 2018-12-12 DIAGNOSIS — M545 Low back pain, unspecified: Secondary | ICD-10-CM

## 2018-12-12 DIAGNOSIS — E039 Hypothyroidism, unspecified: Secondary | ICD-10-CM

## 2018-12-12 DIAGNOSIS — E785 Hyperlipidemia, unspecified: Secondary | ICD-10-CM

## 2018-12-12 DIAGNOSIS — G8929 Other chronic pain: Secondary | ICD-10-CM

## 2018-12-12 LAB — BMP8+EGFR
BUN/Creatinine Ratio: 8 — ABNORMAL LOW (ref 10–24)
BUN: 13 mg/dL (ref 8–27)
CO2: 21 mmol/L (ref 20–29)
Calcium: 9.1 mg/dL (ref 8.6–10.2)
Chloride: 101 mmol/L (ref 96–106)
Creatinine, Ser: 1.64 mg/dL — ABNORMAL HIGH (ref 0.76–1.27)
GFR calc Af Amer: 48 mL/min/{1.73_m2} — ABNORMAL LOW (ref >59)
GFR calc non Af Amer: 41 mL/min/{1.73_m2} — ABNORMAL LOW (ref >59)
Glucose: 105 mg/dL — ABNORMAL HIGH (ref 65–99)
Potassium: 4.5 mmol/L (ref 3.5–5.2)
Sodium: 137 mmol/L (ref 134–144)

## 2018-12-12 MED ORDER — HYDROCODONE-ACETAMINOPHEN 5-325 MG PO TABS: 1.0000 | ORAL_TABLET | Freq: Two times a day (BID) | ORAL | 0 refills | Status: DC | PRN

## 2018-12-12 NOTE — Progress Notes (Signed)
Subjective:    Patient ID: Andrew Phelps, male    DOB: August 03, 1945, 73 y.o.   MRN: [id number]  Chief Complaint  Patient presents with   pain management      Pt presents to the office today for chronic follow up and pain medication. Hypertension This is a chronic problem. The current episode started more than 1 year ago. The problem has been resolved since onset. The problem is controlled. Pertinent negatives include no headaches, peripheral edema or shortness of breath. Risk factors for coronary artery disease include dyslipidemia, obesity, male gender and sedentary lifestyle. The current treatment provides moderate improvement. There is no history of kidney disease, CAD/MI, CVA or heart failure. Identifiable causes of hypertension include a thyroid problem.  Hyperlipidemia This is a chronic problem. The current episode started more than 1 year ago. The problem is controlled. Recent lipid tests were reviewed and are normal. Exacerbating diseases include obesity. Pertinent negatives include no shortness of breath. Current antihyperlipidemic treatment includes statins. The current treatment provides moderate improvement of lipids. Risk factors for coronary artery disease include dyslipidemia, male sex, hypertension and a sedentary lifestyle.  Arthritis Presents for follow-up visit. He complains of pain and stiffness. The symptoms have been stable. Affected locations include the right knee and left knee (back). His pain is at a severity of 8/10. Pertinent negatives include no diarrhea or fatigue.  Asthma There is no cough, shortness of breath or wheezing. This is a chronic problem. The current episode started more than 1 year ago. The problem occurs intermittently. The problem has been waxing and waning. Pertinent negatives include no headaches. He reports moderate improvement on treatment. His past medical history is significant for asthma.  Thyroid Problem Presents for follow-up  visit. Patient reports no constipation, depressed mood, diarrhea or fatigue. The symptoms have been stable. His past medical history is significant for hyperlipidemia. There is no history of heart failure.  Neuropathy Pt states he has constant burning pain of 10 out 10. He takes gabapentin with mild relief.    [place] Controlled Substance Abuse database reviewed- Yes If yes- were their any concerning findings :     [place] drug screen performed- Yes, will do today   Designated Pharmacy- [facility]   Current opioids rx- Norco 5-325 mg # prescribed- 60 Morphine mg equivalent- 9.67 mme/day  Pain management agreement reviewed and signed- Yes, 09/08/18    Review of Systems  Constitutional: Negative for fatigue.  Respiratory: Negative for cough, shortness of breath and wheezing.   Gastrointestinal: Negative for constipation and diarrhea.  Musculoskeletal: Positive for arthritis and stiffness.  Neurological: Negative for headaches.  All other systems reviewed and are negative.      Objective:   Physical Exam Vitals signs reviewed.  Constitutional:      General: He is not in acute distress.    Appearance: He is well-developed.  HENT:     Head: Normocephalic.     Right Ear: Tympanic membrane normal.     Left Ear: Tympanic membrane normal.  Eyes:     General:        Right eye: No discharge.        Left eye: No discharge.     Pupils: Pupils are equal, round, and reactive to light.  Neck:     Musculoskeletal: Normal range of motion and neck supple.     Thyroid: No thyromegaly.  Cardiovascular:     Rate and Rhythm: Normal rate and regular rhythm.  Heart sounds: Normal heart sounds. No murmur.  Pulmonary:     Effort: Pulmonary effort is normal. No respiratory distress.     Breath sounds: Normal breath sounds. No wheezing.  Abdominal:     General: Bowel sounds are normal. There is no distension.     Palpations: Abdomen is soft.     Tenderness:  There is no abdominal tenderness.  Musculoskeletal: Normal range of motion.        General: No tenderness.  Skin:    General: Skin is warm and dry.     Findings: No erythema or rash.  Neurological:     Mental Status: He is alert and oriented to person, place, and time.     Cranial Nerves: No cranial nerve deficit.     Deep Tendon Reflexes: Reflexes are normal and symmetric.  Psychiatric:        Behavior: Behavior normal.        Thought Content: Thought content normal.        Judgment: Judgment normal.       BP 135/75    Pulse 73    Temp (!) 97 F (36.1 C) (Oral)    Ht '5\' 11"'$  (1.803 m)    Wt 271 lb 12.8 oz (123.3 kg)    BMI 37.91 kg/m      Assessment & Plan:  Andrew Phelps comes in today with chief complaint of pain management   Diagnosis and orders addressed:  1. Essential hypertension - BMP8+EGFR  2. Chronic asthma without complication, unspecified asthma severity, unspecified whether persistent  - BMP8+EGFR  3. Hypothyroidism, unspecified type - BMP8+EGFR  4. Arthritis - HYDROcodone-acetaminophen (NORCO) 5-325 MG tablet; Take 1 tablet by mouth every 12 (twelve) hours as needed for moderate pain.  Dispense: 60 tablet; Refill: 0 - HYDROcodone-acetaminophen (NORCO) 5-325 MG tablet; Take 1 tablet by mouth every 12 (twelve) hours as needed for moderate pain.  Dispense: 60 tablet; Refill: 0 - HYDROcodone-acetaminophen (NORCO/VICODIN) 5-325 MG tablet; Take 1 tablet by mouth every 12 (twelve) hours as needed for moderate pain.  Dispense: 60 tablet; Refill: 0 - ToxASSURE Select 13 (MW), Urine - BMP8+EGFR  5. Pain medication agreement signed - HYDROcodone-acetaminophen (NORCO) 5-325 MG tablet; Take 1 tablet by mouth every 12 (twelve) hours as needed for moderate pain.  Dispense: 60 tablet; Refill: 0 - HYDROcodone-acetaminophen (NORCO) 5-325 MG tablet; Take 1 tablet by mouth every 12 (twelve) hours as needed for moderate pain.  Dispense: 60 tablet; Refill: 0 -  HYDROcodone-acetaminophen (NORCO/VICODIN) 5-325 MG tablet; Take 1 tablet by mouth every 12 (twelve) hours as needed for moderate pain.  Dispense: 60 tablet; Refill: 0 - ToxASSURE Select 13 (MW), Urine - BMP8+EGFR  6. Uncomplicated opioid dependence ([facility]) - HYDROcodone-acetaminophen (NORCO) 5-325 MG tablet; Take 1 tablet by mouth every 12 (twelve) hours as needed for moderate pain.  Dispense: 60 tablet; Refill: 0 - HYDROcodone-acetaminophen (NORCO) 5-325 MG tablet; Take 1 tablet by mouth every 12 (twelve) hours as needed for moderate pain.  Dispense: 60 tablet; Refill: 0 - HYDROcodone-acetaminophen (NORCO/VICODIN) 5-325 MG tablet; Take 1 tablet by mouth every 12 (twelve) hours as needed for moderate pain.  Dispense: 60 tablet; Refill: 0 - ToxASSURE Select 13 (MW), Urine - BMP8+EGFR  7. Obesity (BMI 30-39.9) - BMP8+EGFR  8. Hyperlipidemia, unspecified hyperlipidemia type - BMP8+EGFR  9. Chronic bilateral low back pain, unspecified whether sciatica present - ToxASSURE Select 13 (MW), Urine - BMP8+EGFR  10. Chronic bilateral low back pain without sciatica - HYDROcodone-acetaminophen (NORCO) 5-325 MG  tablet; Take 1 tablet by mouth every 12 (twelve) hours as needed for moderate pain.  Dispense: 60 tablet; Refill: 0 - HYDROcodone-acetaminophen (NORCO) 5-325 MG tablet; Take 1 tablet by mouth every 12 (twelve) hours as needed for moderate pain.  Dispense: 60 tablet; Refill: 0 - HYDROcodone-acetaminophen (NORCO/VICODIN) 5-325 MG tablet; Take 1 tablet by mouth every 12 (twelve) hours as needed for moderate pain.  Dispense: 60 tablet; Refill: 0 - ToxASSURE Select 13 (MW), Urine - BMP8+EGFR   Labs pending Pt reviewed in [place] controlled database- No red flags Health Maintenance reviewed Diet and exercise encouraged  Follow up plan: 3 months    Evelina Dun, FNP

## 2018-12-12 NOTE — Patient Instructions (Signed)

## 2018-12-14 LAB — TOXASSURE SELECT 13 (MW), URINE

## 2018-12-28 ENCOUNTER — Other Ambulatory Visit: Payer: Self-pay | Admitting: Family

## 2018-12-28 DIAGNOSIS — G6289 Other specified polyneuropathies: Secondary | ICD-10-CM

## 2019-01-25 ENCOUNTER — Other Ambulatory Visit: Payer: Self-pay | Admitting: Family

## 2019-01-25 DIAGNOSIS — G6289 Other specified polyneuropathies: Secondary | ICD-10-CM

## 2019-02-22 ENCOUNTER — Other Ambulatory Visit: Payer: Self-pay | Admitting: Family

## 2019-02-22 DIAGNOSIS — G6289 Other specified polyneuropathies: Secondary | ICD-10-CM

## 2019-03-15 ENCOUNTER — Encounter: Payer: Self-pay | Admitting: Family

## 2019-03-15 ENCOUNTER — Ambulatory Visit (INDEPENDENT_AMBULATORY_CARE_PROVIDER_SITE_OTHER): Payer: Medicare Other | Admitting: Family

## 2019-03-15 ENCOUNTER — Other Ambulatory Visit: Payer: Self-pay | Admitting: Family

## 2019-03-15 DIAGNOSIS — E785 Hyperlipidemia, unspecified: Secondary | ICD-10-CM

## 2019-03-15 DIAGNOSIS — Z1212 Encounter for screening for malignant neoplasm of rectum: Secondary | ICD-10-CM

## 2019-03-15 DIAGNOSIS — M1990 Unspecified osteoarthritis, unspecified site: Secondary | ICD-10-CM | POA: Diagnosis not present

## 2019-03-15 DIAGNOSIS — M545 Low back pain, unspecified: Secondary | ICD-10-CM

## 2019-03-15 DIAGNOSIS — J45909 Unspecified asthma, uncomplicated: Secondary | ICD-10-CM

## 2019-03-15 DIAGNOSIS — I10 Essential (primary) hypertension: Secondary | ICD-10-CM

## 2019-03-15 DIAGNOSIS — Z1211 Encounter for screening for malignant neoplasm of colon: Secondary | ICD-10-CM

## 2019-03-15 DIAGNOSIS — E039 Hypothyroidism, unspecified: Secondary | ICD-10-CM | POA: Diagnosis not present

## 2019-03-15 DIAGNOSIS — Z0289 Encounter for other administrative examinations: Secondary | ICD-10-CM

## 2019-03-15 DIAGNOSIS — G6289 Other specified polyneuropathies: Secondary | ICD-10-CM

## 2019-03-15 DIAGNOSIS — E669 Obesity, unspecified: Secondary | ICD-10-CM

## 2019-03-15 DIAGNOSIS — F1120 Opioid dependence, uncomplicated: Secondary | ICD-10-CM

## 2019-03-15 DIAGNOSIS — G4700 Insomnia, unspecified: Secondary | ICD-10-CM

## 2019-03-15 DIAGNOSIS — G8929 Other chronic pain: Secondary | ICD-10-CM

## 2019-03-15 MED ORDER — HYDROCODONE-ACETAMINOPHEN 5-325 MG PO TABS: 1.0000 | ORAL_TABLET | Freq: Two times a day (BID) | ORAL | 0 refills | Status: DC | PRN

## 2019-03-15 NOTE — Progress Notes (Signed)
Virtual Visit via telephone Note Due to COVID-19 pandemic this visit was conducted virtually. This visit type was conducted due to national recommendations for restrictions regarding the COVID-19 Pandemic (e.g. social distancing, sheltering in place) in an effort to limit this patient's exposure and mitigate transmission in our community. All issues noted in this document were discussed and addressed.  A physical exam was not performed with this format.  I connected with Andrew Phelps on 03/15/19 at 8:30 AM  by telephone and verified that I am speaking with the correct person using two identifiers. Andrew Phelps is currently located at home and no one is currently with him during visit. The provider, Evelina Dun, FNP is located in their office at time of visit.  I discussed the limitations, risks, security and privacy concerns of performing an evaluation and management service by telephone and the availability of in person appointments. I also discussed with the patient that there may be a patient responsible charge related to this service. The patient expressed understanding and agreed to proceed.   History and Present Illness:  Hypertension This is a chronic problem. The current episode started more than 1 year ago. The problem has been resolved since onset. The problem is controlled. Pertinent negatives include no headaches, malaise/fatigue, palpitations, peripheral edema or shortness of breath. Risk factors for coronary artery disease include dyslipidemia, obesity and sedentary lifestyle. The current treatment provides moderate improvement. There is no history of kidney disease, CAD/MI or heart failure. Identifiable causes of hypertension include a thyroid problem.  Asthma There is no cough, difficulty breathing or shortness of breath. This is a chronic problem. The current episode started more than 1 year ago. The problem occurs intermittently. The problem has been waxing  and waning. Pertinent negatives include no headaches or malaise/fatigue. He reports moderate improvement on treatment. His past medical history is significant for asthma.  Thyroid Problem Presents for follow-up visit. Patient reports no constipation, depressed mood, dry skin, fatigue or palpitations. The symptoms have been stable. There is no history of heart failure.  Arthritis Presents for follow-up visit. He complains of pain and stiffness. The symptoms have been stable. Affected locations include the right knee, left knee, right MCP and left MCP (back). His pain is at a severity of 8/10. Pertinent negatives include no fatigue.  Insomnia Primary symptoms: difficulty falling asleep, no malaise/fatigue.  The current episode started more than one year. The onset quality is gradual. The problem occurs intermittently. The problem has been waxing and waning since onset.   Peripheral Neuropathy Pt states he has constantly burning pain of 9 out 10.    Review of Systems  Constitutional: Negative for fatigue and malaise/fatigue.  Respiratory: Negative for cough and shortness of breath.   Cardiovascular: Negative for palpitations.  Gastrointestinal: Negative for constipation.  Musculoskeletal: Positive for arthritis and stiffness.  Neurological: Negative for headaches.  Psychiatric/Behavioral: The patient has insomnia.   All other systems reviewed and are negative.    Observations/Objective: No SOB or distress noted 138/79  Assessment and Plan: 1. Essential hypertension  2. Chronic asthma without complication, unspecified asthma severity, unspecified whether persistent  3. Hypothyroidism, unspecified type  4. Arthritis - HYDROcodone-acetaminophen (NORCO/VICODIN) 5-325 MG tablet; Take 1 tablet by mouth every 12 (twelve) hours as needed for moderate pain.  Dispense: 60 tablet; Refill: 0 - HYDROcodone-acetaminophen (NORCO) 5-325 MG tablet; Take 1 tablet by mouth every 12 (twelve) hours as  needed for moderate pain.  Dispense: 60 tablet; Refill: 0 - HYDROcodone-acetaminophen (  NORCO) 5-325 MG tablet; Take 1 tablet by mouth every 12 (twelve) hours as needed for moderate pain.  Dispense: 60 tablet; Refill: 0  5. Uncomplicated opioid dependence (HCC) - HYDROcodone-acetaminophen (NORCO/VICODIN) 5-325 MG tablet; Take 1 tablet by mouth every 12 (twelve) hours as needed for moderate pain.  Dispense: 60 tablet; Refill: 0 - HYDROcodone-acetaminophen (NORCO) 5-325 MG tablet; Take 1 tablet by mouth every 12 (twelve) hours as needed for moderate pain.  Dispense: 60 tablet; Refill: 0 - HYDROcodone-acetaminophen (NORCO) 5-325 MG tablet; Take 1 tablet by mouth every 12 (twelve) hours as needed for moderate pain.  Dispense: 60 tablet; Refill: 0  6. Pain medication agreement signed - HYDROcodone-acetaminophen (NORCO/VICODIN) 5-325 MG tablet; Take 1 tablet by mouth every 12 (twelve) hours as needed for moderate pain.  Dispense: 60 tablet; Refill: 0 - HYDROcodone-acetaminophen (NORCO) 5-325 MG tablet; Take 1 tablet by mouth every 12 (twelve) hours as needed for moderate pain.  Dispense: 60 tablet; Refill: 0 - HYDROcodone-acetaminophen (NORCO) 5-325 MG tablet; Take 1 tablet by mouth every 12 (twelve) hours as needed for moderate pain.  Dispense: 60 tablet; Refill: 0  7. Obesity (BMI 30-39.9)  8. Insomnia, unspecified type  9. Hyperlipidemia, unspecified hyperlipidemia type  10. Chronic bilateral low back pain without sciatica - HYDROcodone-acetaminophen (NORCO/VICODIN) 5-325 MG tablet; Take 1 tablet by mouth every 12 (twelve) hours as needed for moderate pain.  Dispense: 60 tablet; Refill: 0 - HYDROcodone-acetaminophen (NORCO) 5-325 MG tablet; Take 1 tablet by mouth every 12 (twelve) hours as needed for moderate pain.  Dispense: 60 tablet; Refill: 0 - HYDROcodone-acetaminophen (NORCO) 5-325 MG tablet; Take 1 tablet by mouth every 12 (twelve) hours as needed for moderate pain.  Dispense: 60 tablet;  Refill: 0   Labs reviewed- Will get on next appointment  Health maintanence discussed Pt reviewed in [place] controlled database, no red flags noted. Pt's drug screen and contract UTD.  RTO in 3 months     I discussed the assessment and treatment plan with the patient. The patient was provided an opportunity to ask questions and all were answered. The patient agreed with the plan and demonstrated an understanding of the instructions.   The patient was advised to call back or seek an in-person evaluation if the symptoms worsen or if the condition fails to improve as anticipated.  The above assessment and management plan was discussed with the patient. The patient verbalized understanding of and has agreed to the management plan. Patient is aware to call the clinic if symptoms persist or worsen. Patient is aware when to return to the clinic for a follow-up visit. Patient educated on when it is appropriate to go to the emergency department.   Time call ended: 8:45 AM   I provided 15 minutes of non-face-to-face time during this encounter.    Evelina Dun, FNP

## 2019-03-29 ENCOUNTER — Other Ambulatory Visit: Payer: Self-pay | Admitting: Family

## 2019-03-29 DIAGNOSIS — J302 Other seasonal allergic rhinitis: Secondary | ICD-10-CM

## 2019-04-26 ENCOUNTER — Other Ambulatory Visit: Payer: Self-pay | Admitting: Family

## 2019-04-26 DIAGNOSIS — G6289 Other specified polyneuropathies: Secondary | ICD-10-CM

## 2019-04-30 ENCOUNTER — Other Ambulatory Visit: Payer: Self-pay | Admitting: Family

## 2019-05-21 ENCOUNTER — Other Ambulatory Visit: Payer: Self-pay | Admitting: Family

## 2019-05-21 DIAGNOSIS — G6289 Other specified polyneuropathies: Secondary | ICD-10-CM

## 2019-06-11 ENCOUNTER — Other Ambulatory Visit: Payer: Self-pay

## 2019-06-12 ENCOUNTER — Ambulatory Visit (INDEPENDENT_AMBULATORY_CARE_PROVIDER_SITE_OTHER): Payer: Medicare Other | Admitting: Family

## 2019-06-12 ENCOUNTER — Encounter: Payer: Self-pay | Admitting: Family

## 2019-06-12 DIAGNOSIS — J45909 Unspecified asthma, uncomplicated: Secondary | ICD-10-CM

## 2019-06-12 DIAGNOSIS — D649 Anemia, unspecified: Secondary | ICD-10-CM

## 2019-06-12 DIAGNOSIS — F1120 Opioid dependence, uncomplicated: Secondary | ICD-10-CM

## 2019-06-12 DIAGNOSIS — G4700 Insomnia, unspecified: Secondary | ICD-10-CM

## 2019-06-12 DIAGNOSIS — E785 Hyperlipidemia, unspecified: Secondary | ICD-10-CM

## 2019-06-12 DIAGNOSIS — M1990 Unspecified osteoarthritis, unspecified site: Secondary | ICD-10-CM | POA: Diagnosis not present

## 2019-06-12 DIAGNOSIS — M545 Low back pain, unspecified: Secondary | ICD-10-CM

## 2019-06-12 DIAGNOSIS — E669 Obesity, unspecified: Secondary | ICD-10-CM

## 2019-06-12 DIAGNOSIS — G8929 Other chronic pain: Secondary | ICD-10-CM

## 2019-06-12 DIAGNOSIS — Z0289 Encounter for other administrative examinations: Secondary | ICD-10-CM

## 2019-06-12 DIAGNOSIS — I10 Essential (primary) hypertension: Secondary | ICD-10-CM

## 2019-06-12 DIAGNOSIS — E039 Hypothyroidism, unspecified: Secondary | ICD-10-CM | POA: Diagnosis not present

## 2019-06-12 MED ORDER — HYDROCODONE-ACETAMINOPHEN 5-325 MG PO TABS: 1.0000 | ORAL_TABLET | Freq: Two times a day (BID) | ORAL | 0 refills | Status: DC | PRN

## 2019-06-12 NOTE — Progress Notes (Signed)
Virtual Visit via telephone Note Due to COVID-19 pandemic this visit was conducted virtually. This visit type was conducted due to national recommendations for restrictions regarding the COVID-19 Pandemic (e.g. social distancing, sheltering in place) in an effort to limit this patient's exposure and mitigate transmission in our community. All issues noted in this document were discussed and addressed.  A physical exam was not performed with this format.  I connected with Andrew Phelps on 06/12/19 at 9:03 pm  by telephone and verified that I am speaking with the correct person using two identifiers. Andrew Phelps is currently located at home and  No ne is currently with him  during visit. The provider, Evelina Dun, FNP is located in their office at time of visit.  I discussed the limitations, risks, security and privacy concerns of performing an evaluation and management service by telephone and the availability of in person appointments. I also discussed with the patient that there may be a patient responsible charge related to this service. The patient expressed understanding and agreed to proceed.   History and Present Illness:  PT calls the office today for chronic follow up and pain medication refill.  Hypertension This is a chronic problem. The current episode started more than 1 year ago. The problem has been resolved since onset. The problem is controlled. Associated symptoms include peripheral edema. Pertinent negatives include no headaches, malaise/fatigue or shortness of breath. Risk factors for coronary artery disease include dyslipidemia, obesity and male gender. The current treatment provides moderate improvement. There is no history of kidney disease, CAD/MI or heart failure. Identifiable causes of hypertension include a thyroid problem.  Asthma There is no chest tightness, cough, hemoptysis, shortness of breath or wheezing. This is a chronic problem. The current  episode started more than 1 year ago. The problem occurs intermittently. Pertinent negatives include no headaches or malaise/fatigue. His past medical history is significant for asthma.  Thyroid Problem Presents for follow-up visit. Patient reports no constipation, diarrhea, dry skin or fatigue. The symptoms have been stable. His past medical history is significant for hyperlipidemia. There is no history of heart failure.  Insomnia Primary symptoms: difficulty falling asleep, no malaise/fatigue.  The current episode started more than one year. The onset quality is gradual. The problem occurs intermittently.  Hyperlipidemia This is a chronic problem. The current episode started more than 1 year ago. The problem is uncontrolled. Exacerbating diseases include obesity. Pertinent negatives include no shortness of breath. Current antihyperlipidemic treatment includes statins. The current treatment provides moderate improvement of lipids. Risk factors for coronary artery disease include obesity.  Back Pain This is a chronic problem. Pertinent negatives include no headaches.  Anemia There has been no malaise/fatigue. There is no history of heart failure.      Review of Systems  Constitutional: Negative for fatigue and malaise/fatigue.  Respiratory: Negative for cough, hemoptysis, shortness of breath and wheezing.   Gastrointestinal: Negative for constipation and diarrhea.  Musculoskeletal: Positive for back pain.  Neurological: Negative for headaches.  Psychiatric/Behavioral: The patient has insomnia.      Observations/Objective: No SOB or distress noted  Assessment and Plan: Andrew Phelps comes in today with chief complaint of No chief complaint on file.   Diagnosis and orders addressed:  1. Essential hypertension - CMP14+EGFR; Future - CBC with Differential/Platelet; Future  2. Chronic asthma without complication, unspecified asthma severity, unspecified whether  persistent - CMP14+EGFR; Future - CBC with Differential/Platelet; Future  3. Hypothyroidism, unspecified type - CMP14+EGFR; Future -  CBC with Differential/Platelet; Future - TSH; Future  4. Arthritis  - HYDROcodone-acetaminophen (NORCO/VICODIN) 5-325 MG tablet; Take 1 tablet by mouth every 12 (twelve) hours as needed for moderate pain.  Dispense: 60 tablet; Refill: 0 - HYDROcodone-acetaminophen (NORCO) 5-325 MG tablet; Take 1 tablet by mouth every 12 (twelve) hours as needed for moderate pain.  Dispense: 60 tablet; Refill: 0 - HYDROcodone-acetaminophen (NORCO) 5-325 MG tablet; Take 1 tablet by mouth every 12 (twelve) hours as needed for moderate pain.  Dispense: 60 tablet; Refill: 0 - CMP14+EGFR; Future - CBC with Differential/Platelet; Future  5. Insomnia, unspecified type - CMP14+EGFR; Future - CBC with Differential/Platelet; Future  6. Hyperlipidemia, unspecified hyperlipidemia type - CMP14+EGFR; Future - CBC with Differential/Platelet; Future - Lipid panel; Future  7. Obesity (BMI 30-39.9) - CMP14+EGFR; Future - CBC with Differential/Platelet; Future  8. Chronic bilateral low back pain, unspecified whether sciatica present - CMP14+EGFR; Future - CBC with Differential/Platelet; Future  9. Pain medication agreement signed  - HYDROcodone-acetaminophen (NORCO/VICODIN) 5-325 MG tablet; Take 1 tablet by mouth every 12 (twelve) hours as needed for moderate pain.  Dispense: 60 tablet; Refill: 0 - HYDROcodone-acetaminophen (NORCO) 5-325 MG tablet; Take 1 tablet by mouth every 12 (twelve) hours as needed for moderate pain.  Dispense: 60 tablet; Refill: 0 - HYDROcodone-acetaminophen (NORCO) 5-325 MG tablet; Take 1 tablet by mouth every 12 (twelve) hours as needed for moderate pain.  Dispense: 60 tablet; Refill: 0 - CMP14+EGFR; Future - CBC with Differential/Platelet; Future  10. Uncomplicated opioid dependence (HCC)  - HYDROcodone-acetaminophen (NORCO/VICODIN) 5-325 MG tablet;  Take 1 tablet by mouth every 12 (twelve) hours as needed for moderate pain.  Dispense: 60 tablet; Refill: 0 - HYDROcodone-acetaminophen (NORCO) 5-325 MG tablet; Take 1 tablet by mouth every 12 (twelve) hours as needed for moderate pain.  Dispense: 60 tablet; Refill: 0 - HYDROcodone-acetaminophen (NORCO) 5-325 MG tablet; Take 1 tablet by mouth every 12 (twelve) hours as needed for moderate pain.  Dispense: 60 tablet; Refill: 0 - CMP14+EGFR; Future - CBC with Differential/Platelet; Future  11. Anemia, unspecified type - CMP14+EGFR; Future - CBC with Differential/Platelet; Future  12. Chronic bilateral low back pain without sciatica - HYDROcodone-acetaminophen (NORCO/VICODIN) 5-325 MG tablet; Take 1 tablet by mouth every 12 (twelve) hours as needed for moderate pain.  Dispense: 60 tablet; Refill: 0 - HYDROcodone-acetaminophen (NORCO) 5-325 MG tablet; Take 1 tablet by mouth every 12 (twelve) hours as needed for moderate pain.  Dispense: 60 tablet; Refill: 0 - HYDROcodone-acetaminophen (NORCO) 5-325 MG tablet; Take 1 tablet by mouth every 12 (twelve) hours as needed for moderate pain.  Dispense: 60 tablet; Refill: 0 - CMP14+EGFR; Future - CBC with Differential/Platelet; Future   Labs pending Health Maintenance reviewed Diet and exercise encouraged  Follow up plan: 3 months face to face to updated contract     I discussed the assessment and treatment plan with the patient. The patient was provided an opportunity to ask questions and all were answered. The patient agreed with the plan and demonstrated an understanding of the instructions.   The patient was advised to call back or seek an in-person evaluation if the symptoms worsen or if the condition fails to improve as anticipated.  The above assessment and management plan was discussed with the patient. The patient verbalized understanding of and has agreed to the management plan. Patient is aware to call the clinic if symptoms persist or  worsen. Patient is aware when to return to the clinic for a follow-up visit. Patient educated on when it  is appropriate to go to the emergency department.   Time call ended:  9:26AM  I provided 23 minutes of non-face-to-face time during this encounter.    Evelina Dun, FNP

## 2019-06-28 ENCOUNTER — Other Ambulatory Visit: Payer: Self-pay | Admitting: Family

## 2019-06-29 ENCOUNTER — Other Ambulatory Visit: Payer: Self-pay

## 2019-07-02 ENCOUNTER — Other Ambulatory Visit: Payer: Self-pay

## 2019-07-02 ENCOUNTER — Other Ambulatory Visit: Payer: Medicare Other

## 2019-07-02 ENCOUNTER — Ambulatory Visit (INDEPENDENT_AMBULATORY_CARE_PROVIDER_SITE_OTHER): Payer: Medicare Other | Admitting: *Deleted

## 2019-07-02 DIAGNOSIS — E785 Hyperlipidemia, unspecified: Secondary | ICD-10-CM

## 2019-07-02 DIAGNOSIS — F1120 Opioid dependence, uncomplicated: Secondary | ICD-10-CM

## 2019-07-02 DIAGNOSIS — M1990 Unspecified osteoarthritis, unspecified site: Secondary | ICD-10-CM

## 2019-07-02 DIAGNOSIS — I10 Essential (primary) hypertension: Secondary | ICD-10-CM

## 2019-07-02 DIAGNOSIS — D649 Anemia, unspecified: Secondary | ICD-10-CM

## 2019-07-02 DIAGNOSIS — E039 Hypothyroidism, unspecified: Secondary | ICD-10-CM

## 2019-07-02 DIAGNOSIS — Z23 Encounter for immunization: Secondary | ICD-10-CM | POA: Diagnosis not present

## 2019-07-02 DIAGNOSIS — J45909 Unspecified asthma, uncomplicated: Secondary | ICD-10-CM

## 2019-07-02 DIAGNOSIS — E669 Obesity, unspecified: Secondary | ICD-10-CM

## 2019-07-02 DIAGNOSIS — Z0289 Encounter for other administrative examinations: Secondary | ICD-10-CM

## 2019-07-02 DIAGNOSIS — G4700 Insomnia, unspecified: Secondary | ICD-10-CM

## 2019-07-02 DIAGNOSIS — G8929 Other chronic pain: Secondary | ICD-10-CM

## 2019-07-03 ENCOUNTER — Other Ambulatory Visit: Payer: Self-pay | Admitting: Family

## 2019-07-03 DIAGNOSIS — N183 Chronic kidney disease, stage 3 unspecified: Secondary | ICD-10-CM | POA: Insufficient documentation

## 2019-07-03 DIAGNOSIS — N1832 Chronic kidney disease, stage 3b: Secondary | ICD-10-CM | POA: Insufficient documentation

## 2019-07-03 LAB — CMP14+EGFR
ALT: 40 IU/L (ref 0–44)
AST: 32 IU/L (ref 0–40)
Albumin/Globulin Ratio: 1.4 (ref 1.2–2.2)
Albumin: 4.3 g/dL (ref 3.7–4.7)
Alkaline Phosphatase: 92 IU/L (ref 39–117)
BUN/Creatinine Ratio: 8 — ABNORMAL LOW (ref 10–24)
BUN: 15 mg/dL (ref 8–27)
Bilirubin Total: 1 mg/dL (ref 0.0–1.2)
CO2: 24 mmol/L (ref 20–29)
Calcium: 8.9 mg/dL (ref 8.6–10.2)
Chloride: 100 mmol/L (ref 96–106)
Creatinine, Ser: 1.77 mg/dL — ABNORMAL HIGH (ref 0.76–1.27)
GFR calc Af Amer: 44 mL/min/{1.73_m2} — ABNORMAL LOW (ref >59)
GFR calc non Af Amer: 38 mL/min/{1.73_m2} — ABNORMAL LOW (ref >59)
Globulin, Total: 3 g/dL (ref 1.5–4.5)
Glucose: 103 mg/dL — ABNORMAL HIGH (ref 65–99)
Potassium: 4.4 mmol/L (ref 3.5–5.2)
Sodium: 137 mmol/L (ref 134–144)
Total Protein: 7.3 g/dL (ref 6.0–8.5)

## 2019-07-03 LAB — CBC WITH DIFFERENTIAL/PLATELET
Basophils Absolute: 0.1 10*3/uL (ref 0.0–0.2)
Basos: 1 %
EOS (ABSOLUTE): 0.5 10*3/uL — ABNORMAL HIGH (ref 0.0–0.4)
Eos: 6 %
Hematocrit: 44.9 % (ref 37.5–51.0)
Hemoglobin: 15 g/dL (ref 13.0–17.7)
Immature Grans (Abs): 0 10*3/uL (ref 0.0–0.1)
Immature Granulocytes: 1 %
Lymphocytes Absolute: 2.5 10*3/uL (ref 0.7–3.1)
Lymphs: 34 %
MCH: 31.4 pg (ref 26.6–33.0)
MCHC: 33.4 g/dL (ref 31.5–35.7)
MCV: 94 fL (ref 79–97)
Monocytes Absolute: 0.5 10*3/uL (ref 0.1–0.9)
Monocytes: 7 %
Neutrophils Absolute: 3.8 10*3/uL (ref 1.4–7.0)
Neutrophils: 51 %
Platelets: 177 10*3/uL (ref 150–450)
RBC: 4.77 x10E6/uL (ref 4.14–5.80)
RDW: 13.8 % (ref 11.6–15.4)
WBC: 7.4 10*3/uL (ref 3.4–10.8)

## 2019-07-03 LAB — TSH: TSH: 1.79 u[IU]/mL (ref 0.450–4.500)

## 2019-07-03 LAB — LIPID PANEL
Chol/HDL Ratio: 2.9 ratio (ref 0.0–5.0)
Cholesterol, Total: 117 mg/dL (ref 100–199)
HDL: 41 mg/dL (ref >39)
LDL Chol Calc (NIH): 59 mg/dL (ref 0–99)
Triglycerides: 86 mg/dL (ref 0–149)
VLDL Cholesterol Cal: 17 mg/dL (ref 5–40)

## 2019-07-17 ENCOUNTER — Other Ambulatory Visit: Payer: Self-pay | Admitting: Family

## 2019-08-03 ENCOUNTER — Other Ambulatory Visit: Payer: Self-pay | Admitting: Family

## 2019-08-03 DIAGNOSIS — R195 Other fecal abnormalities: Secondary | ICD-10-CM

## 2019-08-03 NOTE — Progress Notes (Signed)
Left message to call back  

## 2019-08-03 NOTE — Progress Notes (Signed)
Pt's FIT test was positive. I have placed referral to GI for colonoscopy.

## 2019-08-06 ENCOUNTER — Encounter: Payer: Self-pay | Admitting: Internal Medicine

## 2019-08-06 NOTE — Progress Notes (Signed)
PATIENT AWARE

## 2019-08-16 ENCOUNTER — Other Ambulatory Visit: Payer: Self-pay | Admitting: Family

## 2019-08-16 ENCOUNTER — Other Ambulatory Visit: Payer: Self-pay | Admitting: Nephrology

## 2019-08-16 DIAGNOSIS — N1832 Chronic kidney disease, stage 3b: Secondary | ICD-10-CM

## 2019-08-16 DIAGNOSIS — G6289 Other specified polyneuropathies: Secondary | ICD-10-CM

## 2019-08-21 ENCOUNTER — Other Ambulatory Visit: Payer: Self-pay | Admitting: Family

## 2019-08-24 ENCOUNTER — Encounter: Payer: Self-pay | Admitting: Gastroenterology

## 2019-08-24 ENCOUNTER — Other Ambulatory Visit: Payer: Self-pay

## 2019-08-24 ENCOUNTER — Ambulatory Visit (INDEPENDENT_AMBULATORY_CARE_PROVIDER_SITE_OTHER): Payer: Medicare Other | Admitting: Gastroenterology

## 2019-08-24 ENCOUNTER — Encounter: Payer: Self-pay | Admitting: *Deleted

## 2019-08-24 VITALS — BP 162/87 | HR 81 | Temp 97.0°F | Ht 71.0 in | Wt 271.8 lb

## 2019-08-24 DIAGNOSIS — R195 Other fecal abnormalities: Secondary | ICD-10-CM | POA: Diagnosis not present

## 2019-08-24 NOTE — Progress Notes (Signed)
Primary Care Physician:  Sharion Balloon, FNP  Primary Gastroenterologist:  Garfield Cornea, MD   Chief Complaint  Patient presents with  . + fit test    reports will see blood occas in stool, has to strain when he 1st starts his BM    HPI:  Andrew Phelps is a 74 y.o. male here at the request of Evelina Dun, FNP to schedule colonoscopy for Hemoccult positive stool.  Patient was seen for the same back in 2019, colonoscopy advised but patient canceled procedure stating something had come up.  He presents with his daughter Tammi Klippel today.  Overall he feels well.  Prior to being on pain medication he would have 3 bowel movements a day.  He continues to have a daily bowel movement, feels like it is fairly productive but stools are harder than they used to be.  Denies any abdominal pain.  No nausea vomiting or heartburn.  States he has a history of hemorrhoids.  No family history of colon cancer.  History of prior heavy alcohol use.    Current Outpatient Medications  Medication Sig Dispense Refill  . allopurinol (ZYLOPRIM) 300 MG tablet TAKE ONE TABLET BY MOUTH EVERY DAY 90 tablet 0  . aspirin EC 81 MG tablet Take 81 mg by mouth daily.    Marland Kitchen gabapentin (NEURONTIN) 600 MG tablet Take 1 tablet (600 mg total) by mouth 3 (three) times daily. Needs to be seen for future refills 90 tablet 0  . HYDROcodone-acetaminophen (NORCO) 5-325 MG tablet Take 1 tablet by mouth every 12 (twelve) hours as needed for moderate pain. 60 tablet 0  . HYDROcodone-acetaminophen (NORCO) 5-325 MG tablet Take 1 tablet by mouth every 12 (twelve) hours as needed for moderate pain. 60 tablet 0  . HYDROcodone-acetaminophen (NORCO/VICODIN) 5-325 MG tablet Take 1 tablet by mouth every 12 (twelve) hours as needed for moderate pain. 60 tablet 0  . levothyroxine (SYNTHROID) 125 MCG tablet TAKE ONE TABLET BY MOUTH DAILY. 90 tablet 3  . loratadine (CLARITIN) 10 MG tablet TAKE ONE TABLET BY MOUTH EVERY DAY 30 tablet 4  .  mometasone (NASONEX) 50 MCG/ACT nasal spray INSTILL TWO SPRAYS INTO EACH NOSTRIL DAILY 17 g 1  . montelukast (SINGULAIR) 10 MG tablet TAKE ONE TABLET BY MOUTH AT BEDTIME 90 tablet 0  . Omega-3 Fatty Acids (OMEGA-3 FISH OIL PO) Take by mouth daily.    . simvastatin (ZOCOR) 20 MG tablet TAKE ONE TABLET BY MOUTH DAILY 90 tablet 1   No current facility-administered medications for this visit.    Allergies as of 08/24/2019 - Review Complete 08/24/2019  Allergen Reaction Noted  . Codeine  08/03/2017  . Penicillins  06/05/2013    Past Medical History:  Diagnosis Date  . Arthritis   . Asthma   . Back pain   . Gout   . Hyperlipidemia   . Hypertension   . Insomnia   . Joint pain   . Thyroid disease     Past Surgical History:  Procedure Laterality Date  . none      Family History  Problem Relation Age of Onset  . Heart disease Mother   . Heart disease Father   . Hypertension Brother   . Hyperlipidemia Brother   . Heart disease Sister   . Heart disease Brother   . Heart disease Brother   . Cancer Brother   . Colon cancer Neg Hx   . Liver disease Neg Hx     Social History   Socioeconomic  History  . Marital status: Divorced    Spouse name: Not on file  . Number of children: 3  . Years of education: Not on file  . Highest education level: Not on file  Occupational History  . Not on file  Tobacco Use  . Smoking status: Former Smoker    Quit date: 07/06/1995    Years since quitting: 24.1  . Smokeless tobacco: Current User    Types: Chew  Substance and Sexual Activity  . Alcohol use: Yes    Comment: history of heavy ETOH use, states little bit 08/03/17  . Drug use: No  . Sexual activity: Yes  Other Topics Concern  . Not on file  Social History Narrative  . Not on file   Social Determinants of Health   Financial Resource Strain:   . Difficulty of Paying Living Expenses: Not on file  Food Insecurity:   . Worried About Charity fundraiser in the Last Year: Not on  file  . Ran Out of Food in the Last Year: Not on file  Transportation Needs:   . Lack of Transportation (Medical): Not on file  . Lack of Transportation (Non-Medical): Not on file  Physical Activity:   . Days of Exercise per Week: Not on file  . Minutes of Exercise per Session: Not on file  Stress:   . Feeling of Stress : Not on file  Social Connections:   . Frequency of Communication with Friends and Family: Not on file  . Frequency of Social Gatherings with Friends and Family: Not on file  . Attends Religious Services: Not on file  . Active Member of Clubs or Organizations: Not on file  . Attends Archivist Meetings: Not on file  . Marital Status: Not on file  Intimate Partner Violence:   . Fear of Current or Ex-Partner: Not on file  . Emotionally Abused: Not on file  . Physically Abused: Not on file  . Sexually Abused: Not on file      ROS:  General: Negative for anorexia, weight loss, fever, chills, fatigue, weakness. Eyes: Negative for vision changes.  ENT: Negative for hoarseness, difficulty swallowing , nasal congestion. CV: Negative for chest pain, angina, palpitations, dyspnea on exertion, peripheral edema.  Respiratory: Negative for dyspnea at rest, dyspnea on exertion, cough, sputum, wheezing.  GI: See history of present illness. GU:  Negative for dysuria, hematuria, urinary incontinence, urinary frequency, nocturnal urination.  MS: Negative for joint pain, low back pain.  Derm: Negative for rash or itching.  Neuro: Negative for weakness, abnormal sensation, seizure, frequent headaches, memory loss, confusion.  Psych: Negative for anxiety, depression, suicidal ideation, hallucinations.  Endo: Negative for unusual weight change.  Heme: Negative for bruising or bleeding. Allergy: Negative for rash or hives.    Physical Examination:  BP (!) 162/87   Pulse 81   Temp (!) 97 F (36.1 C) (Oral)   Ht 5\' 11"  (1.803 m)   Wt 271 lb 12.8 oz (123.3 kg)   BMI  37.91 kg/m    General: Well-nourished, well-developed in no acute distress.  Head: Normocephalic, atraumatic.   Eyes: Conjunctiva pink, no icterus. Mouth: masked Neck: Supple without thyromegaly, masses, or lymphadenopathy.  Lungs: Clear to auscultation bilaterally.  Heart: Regular rate and rhythm, no murmurs rubs or gallops.  Abdomen: Bowel sounds are normal, nontender, nondistended, no hepatosplenomegaly or masses, no abdominal bruits or    hernia , no rebound or guarding.   Rectal: Not Performed Extremities: No lower  extremity edema. No clubbing or deformities.  Neuro: Alert and oriented x 4 , grossly normal neurologically.  Skin: Warm and dry, no rash or jaundice.   Psych: Alert and cooperative, normal mood and affect.  Labs: Lab Results  Component Value Date   CREATININE 1.77 (H) 07/02/2019   BUN 15 07/02/2019   NA 137 07/02/2019   K 4.4 07/02/2019   CL 100 07/02/2019   CO2 24 07/02/2019   Lab Results  Component Value Date   ALT 40 07/02/2019   AST 32 07/02/2019   ALKPHOS 92 07/02/2019   BILITOT 1.0 07/02/2019   Lab Results  Component Value Date   WBC 7.4 07/02/2019   HGB 15.0 07/02/2019   HCT 44.9 07/02/2019   MCV 94 07/02/2019   PLT 177 07/02/2019   Lab Results  Component Value Date   TSH 1.790 07/02/2019     Imaging Studies: No results found.

## 2019-08-24 NOTE — Patient Instructions (Signed)
1. Add docusate sodium 200mg  at bedtime to soften stools. 2. Colonoscopy as scheduled. See separate instructions.

## 2019-08-24 NOTE — Assessment & Plan Note (Signed)
Pleasant 74 year old gentleman presenting for further evaluation of Hemoccult positive stool.  No prior colonoscopy.  Denies any upper GI complaints.  Bowel movements daily but sometimes stools are harder.  Still feels like he has a productive stool every day.  Would recommend adding Colace 200 mg at bedtime.  Proceed with colonoscopy in the near future.  Plan for deep sedation given history of prior etoh abuse, chronic pain medication use.  I have discussed the risks, alternatives, benefits with regards to but not limited to the risk of reaction to medication, bleeding, infection, perforation and the patient is agreeable to proceed. Written consent to be obtained.

## 2019-08-27 ENCOUNTER — Encounter: Payer: Self-pay | Admitting: *Deleted

## 2019-08-28 ENCOUNTER — Other Ambulatory Visit: Payer: Self-pay | Admitting: Family

## 2019-08-28 ENCOUNTER — Other Ambulatory Visit: Payer: Self-pay

## 2019-08-28 ENCOUNTER — Ambulatory Visit (HOSPITAL_COMMUNITY)
Admission: RE | Admit: 2019-08-28 | Discharge: 2019-08-28 | Disposition: A | Discharge status: Home / Self Care | Payer: Medicare Other | Source: Ambulatory Visit | Attending: Nephrology | Admitting: Nephrology

## 2019-08-28 DIAGNOSIS — Z79899 Other long term (current) drug therapy: Secondary | ICD-10-CM | POA: Diagnosis not present

## 2019-08-28 DIAGNOSIS — J302 Other seasonal allergic rhinitis: Secondary | ICD-10-CM

## 2019-08-28 DIAGNOSIS — I129 Hypertensive chronic kidney disease with stage 1 through stage 4 chronic kidney disease, or unspecified chronic kidney disease: Secondary | ICD-10-CM | POA: Diagnosis not present

## 2019-08-28 DIAGNOSIS — N1832 Chronic kidney disease, stage 3b: Secondary | ICD-10-CM | POA: Insufficient documentation

## 2019-08-28 DIAGNOSIS — Z5181 Encounter for therapeutic drug level monitoring: Secondary | ICD-10-CM | POA: Diagnosis not present

## 2019-08-28 DIAGNOSIS — N189 Chronic kidney disease, unspecified: Secondary | ICD-10-CM | POA: Diagnosis not present

## 2019-08-30 ENCOUNTER — Other Ambulatory Visit (HOSPITAL_BASED_OUTPATIENT_CLINIC_OR_DEPARTMENT_OTHER): Payer: Self-pay

## 2019-08-30 DIAGNOSIS — I10 Essential (primary) hypertension: Secondary | ICD-10-CM

## 2019-08-30 DIAGNOSIS — R0683 Snoring: Secondary | ICD-10-CM

## 2019-09-11 ENCOUNTER — Other Ambulatory Visit: Payer: Self-pay | Admitting: Family

## 2019-09-11 ENCOUNTER — Other Ambulatory Visit (HOSPITAL_COMMUNITY)
Admission: RE | Admit: 2019-09-11 | Discharge: 2019-09-11 | Disposition: A | Discharge status: Home / Self Care | Payer: Medicare Other | Source: Ambulatory Visit | Attending: Neurology | Admitting: Neurology

## 2019-09-11 ENCOUNTER — Other Ambulatory Visit: Payer: Self-pay

## 2019-09-11 DIAGNOSIS — Z20822 Contact with and (suspected) exposure to covid-19: Secondary | ICD-10-CM | POA: Insufficient documentation

## 2019-09-11 DIAGNOSIS — M1990 Unspecified osteoarthritis, unspecified site: Secondary | ICD-10-CM

## 2019-09-11 DIAGNOSIS — Z01812 Encounter for preprocedural laboratory examination: Secondary | ICD-10-CM | POA: Insufficient documentation

## 2019-09-11 DIAGNOSIS — F1120 Opioid dependence, uncomplicated: Secondary | ICD-10-CM

## 2019-09-11 DIAGNOSIS — G8929 Other chronic pain: Secondary | ICD-10-CM

## 2019-09-11 DIAGNOSIS — Z0289 Encounter for other administrative examinations: Secondary | ICD-10-CM

## 2019-09-11 LAB — SARS CORONAVIRUS 2 (TAT 6-24 HRS): SARS Coronavirus 2: NEGATIVE

## 2019-09-11 NOTE — Telephone Encounter (Signed)
Last office visit 06/11/2020 Upcoming appointment 10/01/2019 Pain contract 09/07/2018 Last refill 06/12/2019, #60, 2 refills

## 2019-09-12 ENCOUNTER — Other Ambulatory Visit: Payer: Self-pay | Admitting: Family

## 2019-09-12 DIAGNOSIS — M1990 Unspecified osteoarthritis, unspecified site: Secondary | ICD-10-CM

## 2019-09-12 DIAGNOSIS — F1120 Opioid dependence, uncomplicated: Secondary | ICD-10-CM

## 2019-09-12 DIAGNOSIS — G8929 Other chronic pain: Secondary | ICD-10-CM

## 2019-09-12 DIAGNOSIS — Z0289 Encounter for other administrative examinations: Secondary | ICD-10-CM

## 2019-09-13 ENCOUNTER — Other Ambulatory Visit: Payer: Self-pay | Admitting: Family

## 2019-09-13 ENCOUNTER — Ambulatory Visit: Payer: Medicare Other | Admitting: Neurology

## 2019-09-13 ENCOUNTER — Other Ambulatory Visit: Payer: Self-pay

## 2019-09-13 DIAGNOSIS — Z01812 Encounter for preprocedural laboratory examination: Secondary | ICD-10-CM | POA: Diagnosis not present

## 2019-09-13 DIAGNOSIS — I10 Essential (primary) hypertension: Secondary | ICD-10-CM

## 2019-09-13 DIAGNOSIS — R0683 Snoring: Secondary | ICD-10-CM

## 2019-09-13 DIAGNOSIS — Z20822 Contact with and (suspected) exposure to covid-19: Secondary | ICD-10-CM | POA: Diagnosis not present

## 2019-09-13 DIAGNOSIS — G4733 Obstructive sleep apnea (adult) (pediatric): Secondary | ICD-10-CM | POA: Diagnosis not present

## 2019-09-13 DIAGNOSIS — Z0289 Encounter for other administrative examinations: Secondary | ICD-10-CM

## 2019-09-13 DIAGNOSIS — G8929 Other chronic pain: Secondary | ICD-10-CM

## 2019-09-13 DIAGNOSIS — F1120 Opioid dependence, uncomplicated: Secondary | ICD-10-CM

## 2019-09-13 DIAGNOSIS — M1990 Unspecified osteoarthritis, unspecified site: Secondary | ICD-10-CM

## 2019-09-24 NOTE — Procedures (Signed)
Hayden A. Merlene Laughter, MD     [email or web address]             NOCTURNAL POLYSOMNOGRAPHY   LOCATION: [facility]    Patient Name: Andrew Phelps, Andrew Phelps Date: 09/13/2019 Gender: Male D.O.B: [birth year] Age (years): 45 Referring Provider: Manpreet Bhutani Height (inches): 71 Interpreting Physician: Baird Lyons MD, ABSM Weight (lbs): 276 RPSGT: Peak, Robert BMI: 38 MRN: [id number] Neck Size: 20.50 CLINICAL INFORMATION Sleep Study Type: NPSG     Indication for sleep study: N/A     Epworth Sleepiness Score: 7     SLEEP STUDY TECHNIQUE As per the AASM Manual for the Scoring of Sleep and Associated Events v2.3 (April 2016) with a hypopnea requiring 4% desaturations.  The channels recorded and monitored were frontal, central and occipital EEG, electrooculogram (EOG), submentalis EMG (chin), nasal and oral airflow, thoracic and abdominal wall motion, anterior tibialis EMG, snore microphone, electrocardiogram, and pulse oximetry.  MEDICATIONS Medications self-administered by patient taken the night of the study : N/A  Current Outpatient Medications:  .  allopurinol (ZYLOPRIM) 300 MG tablet, Take 1 tablet (300 mg total) by mouth daily., Disp: 90 tablet, Rfl: 0 .  aspirin EC 81 MG tablet, Take 81 mg by mouth daily., Disp: , Rfl:  .  carbidopa-levodopa (SINEMET CR) 50-200 MG tablet, Take by mouth., Disp: , Rfl:  .  gabapentin (NEURONTIN) 600 MG tablet, Take 1 tablet (600 mg total) by mouth 3 (three) times daily. Needs to be seen for future refills, Disp: 90 tablet, Rfl: 0 .  HYDROcodone-acetaminophen (NORCO) 5-325 MG tablet, Take 1 tablet by mouth every 12 (twelve) hours as needed for moderate pain. (Patient not taking: Reported on 10/23/2019), Disp: 60 tablet, Rfl: 0 .  HYDROcodone-acetaminophen (NORCO) 5-325 MG tablet, Take 1 tablet by mouth every 12 (twelve) hours as needed for moderate pain. (Patient not taking: Reported on 10/23/2019), Disp: 60 tablet,  Rfl: 0 .  HYDROcodone-acetaminophen (NORCO/VICODIN) 5-325 MG tablet, Take 1 tablet by mouth every 12 (twelve) hours as needed for moderate pain. (Patient taking differently: Take 1 tablet by mouth 3 (three) times daily as needed for moderate pain. ), Disp: 60 tablet, Rfl: 0 .  levothyroxine (SYNTHROID) 125 MCG tablet, TAKE ONE TABLET BY MOUTH DAILY. (Patient taking differently: Take 125 mcg by mouth daily before breakfast. ), Disp: 90 tablet, Rfl: 3 .  loratadine (CLARITIN) 10 MG tablet, TAKE ONE TABLET BY MOUTH EVERY DAY (Patient taking differently: Take 10 mg by mouth at bedtime. ), Disp: 30 tablet, Rfl: 2 .  mometasone (NASONEX) 50 MCG/ACT nasal spray, INSTILL TWO SPRAYS INTO EACH NOSTRIL DAILY, Disp: 17 g, Rfl: 1 .  montelukast (SINGULAIR) 10 MG tablet, TAKE ONE TABLET BY MOUTH AT BEDTIME (Patient taking differently: Take 10 mg by mouth daily. ), Disp: 90 tablet, Rfl: 1 .  Omega-3 Fatty Acids (OMEGA-3 FISH OIL PO), Take 1 capsule by mouth daily. , Disp: , Rfl:  .  simvastatin (ZOCOR) 20 MG tablet, Take 1 tablet (20 mg total) by mouth daily. (Patient taking differently: Take 20 mg by mouth at bedtime. ), Disp: 90 tablet, Rfl: 1 .  Vitamin D, Ergocalciferol, (DRISDOL) 1.25 MG (50000 UNIT) CAPS capsule, Take 50,000 Units by mouth every Monday. , Disp: , Rfl:    SLEEP ARCHITECTURE The study was initiated at 9:41:52 PM and ended at 5:09:46 AM.  Sleep onset time was 40.1 minutes and the sleep efficiency was 52.5%%. The total sleep time was 235.3 minutes.  Stage REM latency was 184.5 minutes.  The patient spent 9.6%% of the night in stage N1 sleep, 87.0%% in stage N2 sleep, 0.2%% in stage N3 and 3.2% in REM.  Alpha intrusion was absent.  Supine sleep was 100.00%.  RESPIRATORY PARAMETERS The overall apnea/hypopnea index (AHI) was 7.1 per hour. There were 0 total apneas, including 0 obstructive, 0 central and 0 mixed apneas. There were 28 hypopneas and 24 RERAs.  The AHI during Stage REM sleep  was 56.0 per hour.  AHI while supine was 7.1 per hour.  The mean oxygen saturation was 90.9%. The minimum SpO2 during sleep was 73.0%.  moderate snoring was noted during this study.  CARDIAC DATA The 2 lead EKG demonstrated sinus rhythm. The mean heart rate was 65.4 beats per minute. Other EKG findings include: None. LEG MOVEMENT DATA The total PLMS were 0 with a resulting PLMS index of 0.0. Associated arousal with leg movement index was 0.0 .  IMPRESSIONS 1. 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 The severity does not require positive pressure treatment. 2. Absent slow-wave sleep is also noted.   Delano Metz, MD Diplomate, American Board of Sleep Medicine.  ELECTRONICALLY SIGNED ON:  09/24/2019, 8:42 PM [place] PH: [phone]   FX: [phone] [place]

## 2019-09-26 DIAGNOSIS — I129 Hypertensive chronic kidney disease with stage 1 through stage 4 chronic kidney disease, or unspecified chronic kidney disease: Secondary | ICD-10-CM | POA: Diagnosis not present

## 2019-09-26 DIAGNOSIS — N1832 Chronic kidney disease, stage 3b: Secondary | ICD-10-CM | POA: Diagnosis not present

## 2019-09-26 DIAGNOSIS — G4733 Obstructive sleep apnea (adult) (pediatric): Secondary | ICD-10-CM | POA: Diagnosis not present

## 2019-09-26 DIAGNOSIS — E611 Iron deficiency: Secondary | ICD-10-CM | POA: Diagnosis not present

## 2019-09-26 DIAGNOSIS — E559 Vitamin D deficiency, unspecified: Secondary | ICD-10-CM | POA: Diagnosis not present

## 2019-10-01 ENCOUNTER — Encounter: Payer: Self-pay | Admitting: Family

## 2019-10-01 ENCOUNTER — Ambulatory Visit (INDEPENDENT_AMBULATORY_CARE_PROVIDER_SITE_OTHER): Payer: Medicare Other | Admitting: Family

## 2019-10-01 ENCOUNTER — Other Ambulatory Visit: Payer: Self-pay

## 2019-10-01 VITALS — BP 158/83 | HR 79 | Temp 98.6°F | Ht 71.0 in | Wt 273.4 lb

## 2019-10-01 DIAGNOSIS — G8929 Other chronic pain: Secondary | ICD-10-CM

## 2019-10-01 DIAGNOSIS — Z0289 Encounter for other administrative examinations: Secondary | ICD-10-CM | POA: Diagnosis not present

## 2019-10-01 DIAGNOSIS — M1990 Unspecified osteoarthritis, unspecified site: Secondary | ICD-10-CM | POA: Diagnosis not present

## 2019-10-01 DIAGNOSIS — M545 Low back pain, unspecified: Secondary | ICD-10-CM

## 2019-10-01 DIAGNOSIS — I10 Essential (primary) hypertension: Secondary | ICD-10-CM

## 2019-10-01 DIAGNOSIS — G4700 Insomnia, unspecified: Secondary | ICD-10-CM

## 2019-10-01 DIAGNOSIS — F1120 Opioid dependence, uncomplicated: Secondary | ICD-10-CM | POA: Diagnosis not present

## 2019-10-01 DIAGNOSIS — G6289 Other specified polyneuropathies: Secondary | ICD-10-CM

## 2019-10-01 DIAGNOSIS — D649 Anemia, unspecified: Secondary | ICD-10-CM

## 2019-10-01 DIAGNOSIS — E669 Obesity, unspecified: Secondary | ICD-10-CM

## 2019-10-01 DIAGNOSIS — N1832 Chronic kidney disease, stage 3b: Secondary | ICD-10-CM

## 2019-10-01 DIAGNOSIS — E039 Hypothyroidism, unspecified: Secondary | ICD-10-CM

## 2019-10-01 DIAGNOSIS — J45909 Unspecified asthma, uncomplicated: Secondary | ICD-10-CM

## 2019-10-01 DIAGNOSIS — E785 Hyperlipidemia, unspecified: Secondary | ICD-10-CM

## 2019-10-01 LAB — CMP14+EGFR
ALT: 16 IU/L (ref 0–44)
AST: 20 IU/L (ref 0–40)
Albumin/Globulin Ratio: 1.3 (ref 1.2–2.2)
Albumin: 3.9 g/dL (ref 3.7–4.7)
Alkaline Phosphatase: 115 IU/L (ref 39–117)
BUN/Creatinine Ratio: 8 — ABNORMAL LOW (ref 10–24)
BUN: 12 mg/dL (ref 8–27)
Bilirubin Total: 1.3 mg/dL — ABNORMAL HIGH (ref 0.0–1.2)
CO2: 22 mmol/L (ref 20–29)
Calcium: 9.3 mg/dL (ref 8.6–10.2)
Chloride: 101 mmol/L (ref 96–106)
Creatinine, Ser: 1.47 mg/dL — ABNORMAL HIGH (ref 0.76–1.27)
GFR calc Af Amer: 55 mL/min/{1.73_m2} — ABNORMAL LOW (ref >59)
GFR calc non Af Amer: 47 mL/min/{1.73_m2} — ABNORMAL LOW (ref >59)
Globulin, Total: 3.1 g/dL (ref 1.5–4.5)
Glucose: 95 mg/dL (ref 65–99)
Potassium: 4.2 mmol/L (ref 3.5–5.2)
Sodium: 139 mmol/L (ref 134–144)
Total Protein: 7 g/dL (ref 6.0–8.5)

## 2019-10-01 LAB — CBC WITH DIFFERENTIAL/PLATELET
Basophils Absolute: 0.1 10*3/uL (ref 0.0–0.2)
Basos: 1 %
EOS (ABSOLUTE): 0.4 10*3/uL (ref 0.0–0.4)
Eos: 5 %
Hematocrit: 40.8 % (ref 37.5–51.0)
Hemoglobin: 13.5 g/dL (ref 13.0–17.7)
Immature Grans (Abs): 0.1 10*3/uL (ref 0.0–0.1)
Immature Granulocytes: 1 %
Lymphocytes Absolute: 1.7 10*3/uL (ref 0.7–3.1)
Lymphs: 20 %
MCH: 30.9 pg (ref 26.6–33.0)
MCHC: 33.1 g/dL (ref 31.5–35.7)
MCV: 93 fL (ref 79–97)
Monocytes Absolute: 0.4 10*3/uL (ref 0.1–0.9)
Monocytes: 5 %
Neutrophils Absolute: 5.9 10*3/uL (ref 1.4–7.0)
Neutrophils: 68 %
Platelets: 188 10*3/uL (ref 150–450)
RBC: 4.37 x10E6/uL (ref 4.14–5.80)
RDW: 13.1 % (ref 11.6–15.4)
WBC: 8.5 10*3/uL (ref 3.4–10.8)

## 2019-10-01 MED ORDER — HYDROCODONE-ACETAMINOPHEN 5-325 MG PO TABS: 1.0000 | ORAL_TABLET | Freq: Two times a day (BID) | ORAL | 0 refills | Status: DC | PRN

## 2019-10-01 MED ORDER — ALLOPURINOL 300 MG PO TABS: 300.0000 mg | ORAL_TABLET | Freq: Every day | ORAL | 0 refills | Status: DC

## 2019-10-01 MED ORDER — GABAPENTIN 600 MG PO TABS: 600.0000 mg | ORAL_TABLET | Freq: Three times a day (TID) | ORAL | 0 refills | Status: DC

## 2019-10-01 MED ORDER — SIMVASTATIN 20 MG PO TABS: 20.0000 mg | ORAL_TABLET | Freq: Every day | ORAL | 1 refills | Status: DC

## 2019-10-01 NOTE — Patient Instructions (Signed)

## 2019-10-01 NOTE — Progress Notes (Signed)
Subjective:    Patient ID: Andrew Phelps, male    DOB: 20-Jul-1945, 74 y.o.   MRN: [id number]  Chief Complaint  Patient presents with  . Medical Management of Chronic Issues   PT presents the office today for chronic follow up and pain medication refill. PT is followed by Nephrologists every 3 months. He is scheduled for colonoscopy.  Hypertension This is a chronic problem. The current episode started more than 1 year ago. The problem has been waxing and waning since onset. The problem is uncontrolled. Associated symptoms include malaise/fatigue. Pertinent negatives include no peripheral edema or shortness of breath. Identifiable causes of hypertension include a thyroid problem.  Asthma There is no cough, shortness of breath or wheezing. This is a chronic problem. The current episode started more than 1 year ago. The problem occurs intermittently. The problem has been waxing and waning. Associated symptoms include malaise/fatigue. He reports significant improvement on treatment. His past medical history is significant for asthma.  Thyroid Problem Presents for follow-up visit. Symptoms include fatigue. Patient reports no depressed mood, diaphoresis or diarrhea. The symptoms have been stable. His past medical history is significant for hyperlipidemia.  Hyperlipidemia This is a chronic problem. The current episode started more than 1 year ago. Exacerbating diseases include hypothyroidism. Factors aggravating his hyperlipidemia include fatty foods. Pertinent negatives include no shortness of breath. Current antihyperlipidemic treatment includes statins. The current treatment provides moderate improvement of lipids. Risk factors for coronary artery disease include male sex, hypertension, a sedentary lifestyle, dyslipidemia and diabetes mellitus.  Anemia Presents for follow-up visit. Symptoms include malaise/fatigue. There has been no bruising/bleeding easily. Past medical history includes  hypothyroidism.      Review of Systems  Constitutional: Positive for fatigue and malaise/fatigue. Negative for diaphoresis.  Respiratory: Negative for cough, shortness of breath and wheezing.   Gastrointestinal: Negative for diarrhea.  Hematological: Does not bruise/bleed easily.  All other systems reviewed and are negative.      Objective:   Physical Exam Vitals reviewed.  Constitutional:      General: He is not in acute distress.    Appearance: He is well-developed.  HENT:     Head: Normocephalic.     Right Ear: Tympanic membrane normal.     Left Ear: Tympanic membrane normal.  Eyes:     General:        Right eye: No discharge.        Left eye: No discharge.     Pupils: Pupils are equal, round, and reactive to light.  Neck:     Thyroid: No thyromegaly.  Cardiovascular:     Rate and Rhythm: Normal rate and regular rhythm.     Heart sounds: Normal heart sounds. No murmur.  Pulmonary:     Effort: Pulmonary effort is normal. No respiratory distress.     Breath sounds: Normal breath sounds. No wheezing.  Abdominal:     General: Bowel sounds are normal. There is no distension.     Palpations: Abdomen is soft.     Tenderness: There is no abdominal tenderness.  Musculoskeletal:        General: No tenderness. Normal range of motion.     Cervical back: Normal range of motion and neck supple.  Skin:    General: Skin is warm and dry.     Findings: No erythema or rash.  Neurological:     Mental Status: He is alert and oriented to person, place, and time.     Cranial Nerves: No  cranial nerve deficit.     Deep Tendon Reflexes: Reflexes are normal and symmetric.  Psychiatric:        Behavior: Behavior normal.        Thought Content: Thought content normal.        Judgment: Judgment normal.     BP (!) 158/83   Pulse 79   Temp 98.6 F (37 C) (Temporal)   Ht '5\' 11"'$  (1.803 m)   Wt 273 lb 6.4 oz (124 kg)   SpO2 96%   BMI 38.13 kg/m      Assessment & Plan:  Andrew Phelps comes in today with chief complaint of Medical Management of Chronic Issues   Diagnosis and orders addressed:  1. Arthritis - HYDROcodone-acetaminophen (NORCO) 5-325 MG tablet; Take 1 tablet by mouth every 12 (twelve) hours as needed for moderate pain.  Dispense: 60 tablet; Refill: 0 - HYDROcodone-acetaminophen (NORCO) 5-325 MG tablet; Take 1 tablet by mouth every 12 (twelve) hours as needed for moderate pain.  Dispense: 60 tablet; Refill: 0 - HYDROcodone-acetaminophen (NORCO/VICODIN) 5-325 MG tablet; Take 1 tablet by mouth every 12 (twelve) hours as needed for moderate pain.  Dispense: 60 tablet; Refill: 0 - CMP14+EGFR - CBC with Differential/Platelet  2. Pain medication agreement signed - HYDROcodone-acetaminophen (NORCO) 5-325 MG tablet; Take 1 tablet by mouth every 12 (twelve) hours as needed for moderate pain.  Dispense: 60 tablet; Refill: 0 - HYDROcodone-acetaminophen (NORCO) 5-325 MG tablet; Take 1 tablet by mouth every 12 (twelve) hours as needed for moderate pain.  Dispense: 60 tablet; Refill: 0 - HYDROcodone-acetaminophen (NORCO/VICODIN) 5-325 MG tablet; Take 1 tablet by mouth every 12 (twelve) hours as needed for moderate pain.  Dispense: 60 tablet; Refill: 0 - ToxASSURE Select 13 (MW), Urine - CMP14+EGFR - CBC with Differential/Platelet  3. Uncomplicated opioid dependence (HCC) - HYDROcodone-acetaminophen (NORCO) 5-325 MG tablet; Take 1 tablet by mouth every 12 (twelve) hours as needed for moderate pain.  Dispense: 60 tablet; Refill: 0 - HYDROcodone-acetaminophen (NORCO) 5-325 MG tablet; Take 1 tablet by mouth every 12 (twelve) hours as needed for moderate pain.  Dispense: 60 tablet; Refill: 0 - HYDROcodone-acetaminophen (NORCO/VICODIN) 5-325 MG tablet; Take 1 tablet by mouth every 12 (twelve) hours as needed for moderate pain.  Dispense: 60 tablet; Refill: 0 - ToxASSURE Select 13 (MW), Urine - CMP14+EGFR - CBC with Differential/Platelet  4. Chronic bilateral  low back pain without sciatica - HYDROcodone-acetaminophen (NORCO) 5-325 MG tablet; Take 1 tablet by mouth every 12 (twelve) hours as needed for moderate pain.  Dispense: 60 tablet; Refill: 0 - HYDROcodone-acetaminophen (NORCO) 5-325 MG tablet; Take 1 tablet by mouth every 12 (twelve) hours as needed for moderate pain.  Dispense: 60 tablet; Refill: 0 - HYDROcodone-acetaminophen (NORCO/VICODIN) 5-325 MG tablet; Take 1 tablet by mouth every 12 (twelve) hours as needed for moderate pain.  Dispense: 60 tablet; Refill: 0 - CMP14+EGFR - CBC with Differential/Platelet  5. Other polyneuropathy - gabapentin (NEURONTIN) 600 MG tablet; Take 1 tablet (600 mg total) by mouth 3 (three) times daily. Needs to be seen for future refills  Dispense: 90 tablet; Refill: 0 - CMP14+EGFR - CBC with Differential/Platelet  6. Essential hypertension  7. Chronic asthma without complication, unspecified asthma severity, unspecified whether persistent  8. Hypothyroidism, unspecified type  9. Stage 3b chronic kidney disease  10. Anemia, unspecified type  11. Hyperlipidemia, unspecified hyperlipidemia type  12. Insomnia, unspecified type  13. Obesity (BMI 30-39.9)  Labs pending Continue medications  Evelina Dun, FNP

## 2019-10-03 LAB — TOXASSURE SELECT 13 (MW), URINE

## 2019-10-05 ENCOUNTER — Ambulatory Visit (INDEPENDENT_AMBULATORY_CARE_PROVIDER_SITE_OTHER): Payer: Medicare Other | Admitting: *Deleted

## 2019-10-05 ENCOUNTER — Other Ambulatory Visit: Payer: Self-pay

## 2019-10-05 DIAGNOSIS — Z0000 Encounter for general adult medical examination without abnormal findings: Secondary | ICD-10-CM

## 2019-10-05 NOTE — Progress Notes (Signed)
MEDICARE ANNUAL WELLNESS VISIT  10/05/2019  Telephone Visit Disclaimer This Medicare AWV was conducted by telephone due to national recommendations for restrictions regarding the COVID-19 Pandemic (e.g. social distancing).  I verified, using two identifiers, that I am speaking with Andrew Phelps or their authorized healthcare agent. I discussed the limitations, risks, security, and privacy concerns of performing an evaluation and management service by telephone and the potential availability of an in-person appointment in the future. The patient expressed understanding and agreed to proceed.   Subjective:  Andrew Phelps is a 74 y.o. male patient of Hawks, Theador Hawthorne, FNP who had a Medicare Annual Wellness Visit today via telephone. Jamah is Retired and lives alone. he has 3 children. he reports that he is socially active and does interact with friends/family regularly. he is minimally physically active and enjoys gardening.  Patient Care Team: Sharion Balloon, FNP as PCP - General (Family Medicine) Gala Romney Cristopher Estimable, MD as Consulting Physician (Gastroenterology)  Advanced Directives 10/05/2019 07/14/2017  Does Patient Have a Medical Advance Directive? No No  Would patient like information on creating a medical advance directive? Yes (MAU/Ambulatory/Procedural Areas - Information given) Yes (MAU/Ambulatory/Procedural Areas - Information given)    Hospital Utilization Over the Past 12 Months: # of hospitalizations or ER visits: 0 # of surgeries: 0  Review of Systems    Patient reports that his overall health is unchanged compared to last year.  History obtained from chart review and the patient General ROS: negative  Patient Reported Readings (BP, Pulse, CBG, Weight, etc) none  Pain Assessment Pain : No/denies pain     Current Medications & Allergies (verified) Allergies as of 10/05/2019      Reactions   Codeine    Abdominal pain   Penicillins         Medication List       Accurate as of October 05, 2019 10:56 AM. If you have any questions, ask your nurse or doctor.        allopurinol 300 MG tablet Commonly known as: ZYLOPRIM Take 1 tablet (300 mg total) by mouth daily.   aspirin EC 81 MG tablet Take 81 mg by mouth daily.   carbidopa-levodopa 50-200 MG tablet Commonly known as: SINEMET CR Take by mouth.   gabapentin 600 MG tablet Commonly known as: NEURONTIN Take 1 tablet (600 mg total) by mouth 3 (three) times daily. Needs to be seen for future refills   HYDROcodone-acetaminophen 5-325 MG tablet Commonly known as: Norco Take 1 tablet by mouth every 12 (twelve) hours as needed for moderate pain.   HYDROcodone-acetaminophen 5-325 MG tablet Commonly known as: Norco Take 1 tablet by mouth every 12 (twelve) hours as needed for moderate pain.   HYDROcodone-acetaminophen 5-325 MG tablet Commonly known as: NORCO/VICODIN Take 1 tablet by mouth every 12 (twelve) hours as needed for moderate pain.   levothyroxine 125 MCG tablet Commonly known as: SYNTHROID TAKE ONE TABLET BY MOUTH DAILY.   loratadine 10 MG tablet Commonly known as: CLARITIN TAKE ONE TABLET BY MOUTH EVERY DAY   mometasone 50 MCG/ACT nasal spray Commonly known as: NASONEX INSTILL TWO SPRAYS INTO EACH NOSTRIL DAILY   montelukast 10 MG tablet Commonly known as: SINGULAIR TAKE ONE TABLET BY MOUTH AT BEDTIME   OMEGA-3 FISH OIL PO Take by mouth daily.   simvastatin 20 MG tablet Commonly known as: ZOCOR Take 1 tablet (20 mg total) by mouth daily.   Vitamin D (Ergocalciferol) 1.25 MG (50000 UNIT) Caps  capsule Commonly known as: DRISDOL Take 50,000 Units by mouth once a week.       History (reviewed): Past Medical History:  Diagnosis Date  . Arthritis   . Asthma   . Back pain   . Gout   . Hyperlipidemia   . Hypertension   . Insomnia   . Joint pain   . Thyroid disease    Past Surgical History:  Procedure Laterality Date  . none      Family History  Problem Relation Age of Onset  . Heart disease Mother   . Heart disease Father   . Hypertension Brother   . Hyperlipidemia Brother   . Heart disease Sister   . Heart disease Brother   . Heart disease Brother   . Cancer Brother   . Colon cancer Neg Hx   . Liver disease Neg Hx    Social History   Socioeconomic History  . Marital status: Divorced    Spouse name: Not on file  . Number of children: 3  . Years of education: 8   . Highest education level: 8th grade  Occupational History  . Occupation: Retired  Tobacco Use  . Smoking status: Former Smoker    Quit date: 07/06/1995    Years since quitting: 24.2  . Smokeless tobacco: Current User    Types: Chew  Substance and Sexual Activity  . Alcohol use: Yes    Comment: history of heavy ETOH use, states little bit 08/03/17  . Drug use: No  . Sexual activity: Yes  Other Topics Concern  . Not on file  Social History Narrative  . Not on file   Social Determinants of Health   Financial Resource Strain: Low Risk   . Difficulty of Paying Living Expenses: Not hard at all  Food Insecurity: No Food Insecurity  . Worried About Charity fundraiser in the Last Year: Never true  . Ran Out of Food in the Last Year: Never true  Transportation Needs: No Transportation Needs  . Lack of Transportation (Medical): No  . Lack of Transportation (Non-Medical): No  Physical Activity: Inactive  . Days of Exercise per Week: 0 days  . Minutes of Exercise per Session: 0 min  Stress: No Stress Concern Present  . Feeling of Stress : Only a little  Social Connections: Slightly Isolated  . Frequency of Communication with Friends and Family: More than three times a week  . Frequency of Social Gatherings with Friends and Family: More than three times a week  . Attends Religious Services: More than 4 times per year  . Active Member of Clubs or Organizations: Yes  . Attends Archivist Meetings: More than 4 times per year   . Marital Status: Divorced    Activities of Daily Living In your present state of health, do you have any difficulty performing the following activities: 10/05/2019  Hearing? Y  Vision? N  Difficulty concentrating or making decisions? N  Walking or climbing stairs? N  Dressing or bathing? N  Doing errands, shopping? N  Preparing Food and eating ? N  Using the Toilet? N  In the past six months, have you accidently leaked urine? N  Do you have problems with loss of bowel control? N  Managing your Medications? N  Managing your Finances? N  Housekeeping or managing your Housekeeping? N  Some recent data might be hidden    Patient Education/ Literacy How often do you need to have someone help you when you  read instructions, pamphlets, or other written materials from your doctor or pharmacy?: 1 - Never What is the last grade level you completed in school?: 8th Grade  Exercise Current Exercise Habits: The patient does not participate in regular exercise at present, Exercise limited by: None identified  Diet Patient reports consuming 2 meals a day and 0 snack(s) a day Patient reports that his primary diet is: Regular Patient reports that she does have regular access to food.   Depression Screen PHQ 2/9 Scores 10/05/2019 10/01/2019 12/12/2018 09/07/2018 06/06/2018 03/06/2018 12/01/2017  PHQ - 2 Score 0 0 0 0 0 0 0     Fall Risk Fall Risk  10/05/2019 10/01/2019 12/12/2018 09/07/2018 06/06/2018  Falls in the past year? 0 0 0 0 0  Number falls in past yr: 0 - - - -  Injury with Fall? 0 - - - -  Risk for fall due to : No Fall Risks - - - -  Follow up Falls evaluation completed - - - -     Objective:  Andrew Phelps seemed alert and oriented and he participated appropriately during our telephone visit.  Blood Pressure Weight BMI  BP Readings from Last 3 Encounters:  10/01/19 (!) 158/83  08/24/19 (!) 162/87  12/12/18 135/75   Wt Readings from Last 3 Encounters:  10/01/19 273  lb 6.4 oz (124 kg)  08/24/19 271 lb 12.8 oz (123.3 kg)  12/12/18 271 lb 12.8 oz (123.3 kg)   BMI Readings from Last 1 Encounters:  10/01/19 38.13 kg/m    *Unable to obtain current vital signs, weight, and BMI due to telephone visit type  Hearing/Vision  . Morice did  seem to have difficulty with hearing/understanding during the telephone conversation . Reports that he has not had a formal eye exam by an eye care professional within the past year . Reports that he has not had a formal hearing evaluation within the past year *Unable to fully assess hearing and vision during telephone visit type  Cognitive Function: 6CIT Screen 10/05/2019  What Year? 0 points  What month? 0 points  What time? 0 points  Count back from 20 0 points  Months in reverse 0 points  Repeat phrase 2 points  Total Score 2   (Normal:0-7, Significant for Dysfunction: >8)  Normal Cognitive Function Screening: Yes   Immunization & Health Maintenance Record Immunization History  Administered Date(s) Administered  . Fluad Quad(high Dose 65+) 07/02/2019  . Influenza, High Dose Seasonal PF 06/06/2018  . Influenza,inj,Quad PF,6+ Mos 07/05/2013, 05/07/2015, 03/16/2017  . Moderna SARS-COVID-2 Vaccination 09/05/2019  . Pneumococcal Conjugate-13 07/05/2013, 12/11/2014  . Pneumococcal Polysaccharide-23 11/18/2016  . Tdap 10/11/2013    Health Maintenance  Topic Date Due  . Fecal DNA (Cologuard)  09/30/2020 (Originally 11/04/1997)  . INFLUENZA VACCINE  01/20/2020  . TETANUS/TDAP  10/12/2023  . Hepatitis C Screening  Completed  . PNA vac Low Risk Adult  Completed       Assessment  This is a routine wellness examination for [employer].  Health Maintenance: Due or Overdue There are no preventive care reminders to display for this patient.  Alanda Amass Fogleman does not need a referral for [employer] Assistance: Care Management:   no Social Work:    no Prescription  Assistance:  no Nutrition/Diabetes Education:  no   Plan:  Personalized Goals Goals Addressed            This Visit's Progress   . Exercise 3x per week (30 min per  time)       10/05/2019 AWV Goal: Exercise for General Health   Patient will verbalize understanding of the benefits of increased physical activity:  Exercising regularly is important. It will improve your overall fitness, flexibility, and endurance.  Regular exercise also will improve your overall health. It can help you control your weight, reduce stress, and improve your bone density.  Over the next year, patient will increase physical activity as tolerated with a goal of at least 150 minutes of moderate physical activity per week.   You can tell that you are exercising at a moderate intensity if your heart starts beating faster and you start breathing faster but can still hold a conversation.  Moderate-intensity exercise ideas include:  Walking 1 mile (1.6 km) in about 15 minutes  Biking  Hiking  Golfing  Dancing  Water aerobics  Patient will verbalize understanding of everyday activities that increase physical activity by providing examples like the following: ? Yard work, such as: ? Pushing a Conservation officer, nature ? Raking and bagging leaves ? Washing your car ? Pushing a stroller ? Shoveling snow ? Gardening ? Washing windows or floors  Patient will be able to explain general safety guidelines for exercising:   Before you start a new exercise program, talk with your health care provider.  Do not exercise so much that you hurt yourself, feel dizzy, or get very short of breath.  Wear comfortable clothes and wear shoes with good support.  Drink plenty of water while you exercise to prevent dehydration or heat stroke.  Work out until your breathing and your heartbeat get faster.     . Have 3 meals a day       10/05/2019 AWV Goal: Improved Nutrition/Diet  . Patient will verbalize understanding that  diet plays an important role in overall health and that a poor diet is a risk factor for many chronic medical conditions.  . Over the next year, patient will improve self management of their diet by incorporating better variety, improved meal pattern, decreased fat intake, more consistent meal timing, fewer sweetened foods & beverages, increased physical activity, better food choices, decreased sodium intake, will take vitamin/mineral supplement, choose non-carbonated beverages, adequate fluid intake (at least 6 cups of fluid per day), and watch portion sizes/amount of food eaten at one time. . Patient will utilize available community resources to help with food acquisition if needed (ex: food pantries, [address], etc) . Patient will work with nutrition specialist if a referral was made       Personalized Health Maintenance & Screening Recommendations  Colorectal cancer screening Scheduled for 11/05/2019  Lung Cancer Screening Recommended: no (Low Dose CT Chest recommended if Age 62-80 years, 30 pack-year currently smoking OR have quit w/in past 15 years) Hepatitis C Screening recommended: no HIV Screening recommended: no  Advanced Directives: Written information was prepared per patient's request.  Referrals & Orders No orders of the defined types were placed in this encounter.   Follow-up Plan . Follow-up with Sharion Balloon, FNP as planned    I have personally reviewed and noted the following in the patient's chart:   . Medical and social history . Use of alcohol, tobacco or illicit drugs  . Current medications and supplements . Functional ability and status . Nutritional status . Physical activity . Advanced directives . List of other physicians . Hospitalizations, surgeries, and ER visits in previous 12 months . Vitals . Screenings to include cognitive, depression, and falls . Referrals and  appointments  In addition, I have reviewed and discussed with Andrew Phelps certain preventive protocols, quality metrics, and best practice recommendations. A written personalized care plan for preventive services as well as general preventive health recommendations is available and can be mailed to the patient at his request.      Wardell Heath, LPN  [id number]    AWV printed and mailed to patient

## 2019-10-05 NOTE — Patient Instructions (Addendum)
[facility] Maintenance Summary and Written Plan of Care  [name] ,  Thank you for allowing me to perform your Medicare Annual Wellness Visit and for your ongoing commitment to your health.   Health Maintenance & Immunization History Health Maintenance  Topic Date Due  . Fecal DNA (Cologuard)  09/30/2020 (Originally 11/04/1997)  . INFLUENZA VACCINE  01/20/2020  . TETANUS/TDAP  10/12/2023  . Hepatitis C Screening  Completed  . PNA vac Low Risk Adult  Completed   Immunization History  Administered Date(s) Administered  . Fluad Quad(high Dose 65+) 07/02/2019  . Influenza, High Dose Seasonal PF 06/06/2018  . Influenza,inj,Quad PF,6+ Mos 07/05/2013, 05/07/2015, 03/16/2017  . Moderna SARS-COVID-2 Vaccination 09/05/2019  . Pneumococcal Conjugate-13 07/05/2013, 12/11/2014  . Pneumococcal Polysaccharide-23 11/18/2016  . Tdap 10/11/2013    These are the patient goals that we discussed: Goals Addressed            This Visit's Progress   . Exercise 3x per week (30 min per time)       10/05/2019 AWV Goal: Exercise for General Health   Patient will verbalize understanding of the benefits of increased physical activity:  Exercising regularly is important. It will improve your overall fitness, flexibility, and endurance.  Regular exercise also will improve your overall health. It can help you control your weight, reduce stress, and improve your bone density.  Over the next year, patient will increase physical activity as tolerated with a goal of at least 150 minutes of moderate physical activity per week.   You can tell that you are exercising at a moderate intensity if your heart starts beating faster and you start breathing faster but can still hold a conversation.  Moderate-intensity exercise ideas include:  Walking 1 mile (1.6 km) in about 15 minutes  Biking  Hiking  Golfing  Dancing  Water aerobics  Patient will verbalize understanding  of everyday activities that increase physical activity by providing examples like the following: ? Yard work, such as: ? Pushing a Conservation officer, nature ? Raking and bagging leaves ? Washing your car ? Pushing a stroller ? Shoveling snow ? Gardening ? Washing windows or floors  Patient will be able to explain general safety guidelines for exercising:   Before you start a new exercise program, talk with your health care provider.  Do not exercise so much that you hurt yourself, feel dizzy, or get very short of breath.  Wear comfortable clothes and wear shoes with good support.  Drink plenty of water while you exercise to prevent dehydration or heat stroke.  Work out until your breathing and your heartbeat get faster.     . Have 3 meals a day       10/05/2019 AWV Goal: Improved Nutrition/Diet  . Patient will verbalize understanding that diet plays an important role in overall health and that a poor diet is a risk factor for many chronic medical conditions.  . Over the next year, patient will improve self management of their diet by incorporating better variety, improved meal pattern, decreased fat intake, more consistent meal timing, fewer sweetened foods & beverages, increased physical activity, better food choices, decreased sodium intake, will take vitamin/mineral supplement, choose non-carbonated beverages, adequate fluid intake (at least 6 cups of fluid per day), and watch portion sizes/amount of food eaten at one time. . Patient will utilize available community resources to help with food acquisition if needed (ex: food pantries, [address], etc) . Patient will work with nutrition specialist  if a referral was made         This is a list of Health Maintenance Items that are overdue or due now: There are no preventive care reminders to display for this patient.   Orders/Referrals Placed Today: No orders of the defined types were placed in this encounter.  (Contact our referral department  at [phone] if you have not spoken with someone about your referral appointment within the next 5 days)    Follow-up Plan Follow up with Christy A. Lenna Gilford, FNP as scheduled.      Advance Directive  Advance directives are legal documents that let you make choices ahead of time about your health care and medical treatment in case you become unable to communicate for yourself. Advance directives are a way for you to make known your wishes to family, friends, and health care providers. This can let others know about your end-of-life care if you become unable to communicate. Discussing and writing advance directives should happen over time rather than all at once. Advance directives can be changed depending on your situation and what you want, even after you have signed the advance directives. There are different types of advance directives, such as:  Medical power of attorney.  Living will.  Do not resuscitate (DNR) or do not attempt resuscitation (DNAR) order. Health care proxy and medical power of attorney A health care proxy is also called a health care agent. This is a person who is appointed to make medical decisions for you in cases where you are unable to make the decisions yourself. Generally, people choose someone they know well and trust to represent their preferences. Make sure to ask this person for an agreement to act as your proxy. A proxy may have to exercise judgment in the event of a medical decision for which your wishes are not known. A medical power of attorney is a legal document that names your health care proxy. Depending on the laws in your state, after the document is written, it may also need to be:  Signed.  Notarized.  Dated.  Copied.  Witnessed.  Incorporated into your medical record. You may also want to appoint someone to manage your money in a situation in which you are unable to do so. This is called a durable power of attorney for finances. It is a  separate legal document from the durable power of attorney for health care. You may choose the same person or someone different from your health care proxy to act as your agent in money matters. If you do not appoint a proxy, or if there is a concern that the proxy is not acting in your best interests, a court may appoint a guardian to act on your behalf. Living will A living will is a set of instructions that state your wishes about medical care when you cannot express them yourself. Health care providers should keep a copy of your living will in your medical record. You may want to give a copy to family members or friends. To alert caregivers in case of an emergency, you can place a card in your wallet to let them know that you have a living will and where they can find it. A living will is used if you become:  Terminally ill.  Disabled.  Unable to communicate or make decisions. Items to consider in your living will include:  To use or not to use life-support equipment, such as dialysis machines and breathing machines (ventilators).  A  DNR or DNAR order. This tells health care providers not to use cardiopulmonary resuscitation (CPR) if breathing or heartbeat stops.  To use or not to use tube feeding.  To be given or not to be given food and fluids.  Comfort (palliative) care when the goal becomes comfort rather than a cure.  Donation of organs and tissues. A living will does not give instructions for distributing your money and property if you should pass away. DNR or DNAR A DNR or DNAR order is a request not to have CPR in the event that your heart stops beating or you stop breathing. If a DNR or DNAR order has not been made and shared, a health care provider will try to help any patient whose heart has stopped or who has stopped breathing. If you plan to have surgery, talk with your health care provider about how your DNR or DNAR order will be followed if problems occur. What if I do  not have an advance directive? If you do not have an advance directive, some states assign family decision makers to act on your behalf based on how closely you are related to them. Each state has its own laws about advance directives. You may want to check with your health care provider, attorney, or state representative about the laws in your state. Summary  Advance directives are the legal documents that allow you to make choices ahead of time about your health care and medical treatment in case you become unable to tell others about your care.  The process of discussing and writing advance directives should happen over time. You can change the advance directives, even after you have signed them.  Advance directives include DNR or DNAR orders, living wills, and designating an agent as your medical power of attorney. This information is not intended to replace advice given to you by your health care provider. Make sure you discuss any questions you have with your health care provider. Document Revised: 01/04/2019 Document Reviewed: 01/04/2019 Elsevier Patient Education  [place].

## 2019-10-09 ENCOUNTER — Other Ambulatory Visit: Payer: Self-pay | Admitting: Family

## 2019-10-09 ENCOUNTER — Encounter: Payer: Self-pay | Admitting: *Deleted

## 2019-10-25 ENCOUNTER — Other Ambulatory Visit: Payer: Self-pay | Admitting: Family

## 2019-11-01 ENCOUNTER — Encounter (HOSPITAL_COMMUNITY): Payer: Self-pay

## 2019-11-01 ENCOUNTER — Encounter (HOSPITAL_COMMUNITY)
Admission: RE | Admit: 2019-11-01 | Discharge: 2019-11-01 | Disposition: A | Discharge status: Home / Self Care | Payer: Medicare Other | Source: Ambulatory Visit | Attending: Internal Medicine | Admitting: Internal Medicine

## 2019-11-01 ENCOUNTER — Other Ambulatory Visit (HOSPITAL_COMMUNITY)
Admission: RE | Admit: 2019-11-01 | Discharge: 2019-11-01 | Disposition: A | Discharge status: Home / Self Care | Payer: Medicare Other | Source: Ambulatory Visit | Attending: Internal Medicine | Admitting: Internal Medicine

## 2019-11-01 ENCOUNTER — Other Ambulatory Visit: Payer: Self-pay

## 2019-11-01 DIAGNOSIS — Z20822 Contact with and (suspected) exposure to covid-19: Secondary | ICD-10-CM | POA: Insufficient documentation

## 2019-11-01 DIAGNOSIS — Z01812 Encounter for preprocedural laboratory examination: Secondary | ICD-10-CM | POA: Insufficient documentation

## 2019-11-01 HISTORY — DX: Sleep apnea, unspecified: G47.30

## 2019-11-01 HISTORY — DX: Hypothyroidism, unspecified: E03.9

## 2019-11-01 HISTORY — DX: Family history of other specified conditions: Z84.89

## 2019-11-01 HISTORY — DX: Polyneuropathy, unspecified: G62.9

## 2019-11-01 LAB — SARS CORONAVIRUS 2 (TAT 6-24 HRS): SARS Coronavirus 2: NEGATIVE

## 2019-11-01 NOTE — Patient Instructions (Signed)
Andrew Phelps  11/01/2019     @PREFPERIOPPHARMACY @   Your procedure is scheduled on  11/05/2019 .  Report to [facility] at  1100  A.M.  Call this number if you have problems the morning of surgery:  [phone]   Remember:  Follow the diet and prep instructions given to you by Dr Margette Fast office.                   Take these medicines the morning of surgery with A SIP OF WATER  Allopurinol, sinemet, gabapentin, singulair.    Do not wear jewelry, make-up or nail polish.  Do not wear lotions, powders, or perfumes. Please wear deodorant and brush your teeth.  Do not shave 48 hours prior to surgery.  Men may shave face and neck.  Do not bring valuables to the hospital.  [facility] is not responsible for any belongings or valuables.  Contacts, dentures or bridgework may not be worn into surgery.  Leave your suitcase in the car.  After surgery it may be brought to your room.  For patients admitted to the hospital, discharge time will be determined by your treatment team.  Patients discharged the day of surgery will not be allowed to drive home.   Name and phone number of your driver:   family Special instructions:  DO NOT smoke the morning of your procedure.  Please read over the following fact sheets that you were given. Anesthesia Post-op Instructions and Care and Recovery After Surgery       Colonoscopy, Adult, Care After This sheet gives you information about how to care for yourself after your procedure. Your health care provider may also give you more specific instructions. If you have problems or questions, contact your health care provider. What can I expect after the procedure? After the procedure, it is common to have:  A small amount of blood in your stool for 24 hours after the procedure.  Some gas.  Mild cramping or bloating of your abdomen. Follow these instructions at home: Eating and drinking   Drink enough fluid to keep your urine  pale yellow.  Follow instructions from your health care provider about eating or drinking restrictions.  Resume your normal diet as instructed by your health care provider. Avoid heavy or fried foods that are hard to digest. Activity  Rest as told by your health care provider.  Avoid sitting for a long time without moving. Get up to take short walks every 1-2 hours. This is important to improve blood flow and breathing. Ask for help if you feel weak or unsteady.  Return to your normal activities as told by your health care provider. Ask your health care provider what activities are safe for you. Managing cramping and bloating   Try walking around when you have cramps or feel bloated.  Apply heat to your abdomen as told by your health care provider. Use the heat source that your health care provider recommends, such as a moist heat pack or a heating pad. ? Place a towel between your skin and the heat source. ? Leave the heat on for 20-30 minutes. ? Remove the heat if your skin turns bright red. This is especially important if you are unable to feel pain, heat, or cold. You may have a greater risk of getting burned. General instructions  For the first 24 hours after the procedure: ? Do not drive or use machinery. ? Do not sign  important documents. ? Do not drink alcohol. ? Do your regular daily activities at a slower pace than normal. ? Eat soft foods that are easy to digest.  Take over-the-counter and prescription medicines only as told by your health care provider.  Keep all follow-up visits as told by your health care provider. This is important. Contact a health care provider if:  You have blood in your stool 2-3 days after the procedure. Get help right away if you have:  More than a small spotting of blood in your stool.  Large blood clots in your stool.  Swelling of your abdomen.  Nausea or vomiting.  A fever.  Increasing pain in your abdomen that is not relieved  with medicine. Summary  After the procedure, it is common to have a small amount of blood in your stool. You may also have mild cramping and bloating of your abdomen.  For the first 24 hours after the procedure, do not drive or use machinery, sign important documents, or drink alcohol.  Get help right away if you have a lot of blood in your stool, nausea or vomiting, a fever, or increased pain in your abdomen. This information is not intended to replace advice given to you by your health care provider. Make sure you discuss any questions you have with your health care provider. Document Revised: 01/01/2019 Document Reviewed: 01/01/2019 Elsevier Patient Education  [place] After These instructions provide you with information about caring for yourself after your procedure. Your health care provider may also give you more specific instructions. Your treatment has been planned according to current medical practices, but problems sometimes occur. Call your health care provider if you have any problems or questions after your procedure. What can I expect after the procedure? After your procedure, you may:  Feel sleepy for several hours.  Feel clumsy and have poor balance for several hours.  Feel forgetful about what happened after the procedure.  Have poor judgment for several hours.  Feel nauseous or vomit.  Have a sore throat if you had a breathing tube during the procedure. Follow these instructions at home: For at least 24 hours after the procedure:      Have a responsible adult stay with you. It is important to have someone help care for you until you are awake and alert.  Rest as needed.  Do not: ? Participate in activities in which you could fall or become injured. ? Drive. ? Use heavy machinery. ? Drink alcohol. ? Take sleeping pills or medicines that cause drowsiness. ? Make important decisions or sign legal documents. ? Take  care of children on your own. Eating and drinking  Follow the diet that is recommended by your health care provider.  If you vomit, drink water, juice, or soup when you can drink without vomiting.  Make sure you have little or no nausea before eating solid foods. General instructions  Take over-the-counter and prescription medicines only as told by your health care provider.  If you have sleep apnea, surgery and certain medicines can increase your risk for breathing problems. Follow instructions from your health care provider about wearing your sleep device: ? Anytime you are sleeping, including during daytime naps. ? While taking prescription pain medicines, sleeping medicines, or medicines that make you drowsy.  If you smoke, do not smoke without supervision.  Keep all follow-up visits as told by your health care provider. This is important. Contact a health care provider if:  You keep feeling nauseous or you keep vomiting.  You feel light-headed.  You develop a rash.  You have a fever. Get help right away if:  You have trouble breathing. Summary  For several hours after your procedure, you may feel sleepy and have poor judgment.  Have a responsible adult stay with you for at least 24 hours or until you are awake and alert. This information is not intended to replace advice given to you by your health care provider. Make sure you discuss any questions you have with your health care provider. Document Revised: 09/05/2017 Document Reviewed: 09/28/2015 Elsevier Patient Education  [place].

## 2019-11-05 ENCOUNTER — Other Ambulatory Visit: Payer: Self-pay

## 2019-11-05 ENCOUNTER — Ambulatory Visit (HOSPITAL_COMMUNITY): Payer: Medicare Other | Admitting: Anesthesiology

## 2019-11-05 ENCOUNTER — Encounter (HOSPITAL_COMMUNITY): Payer: Self-pay | Admitting: Internal Medicine

## 2019-11-05 ENCOUNTER — Ambulatory Visit (HOSPITAL_COMMUNITY)
Admission: RE | Admit: 2019-11-05 | Discharge: 2019-11-05 | Disposition: A | Discharge status: Home / Self Care | Payer: Medicare Other | Source: Ambulatory Visit | Attending: Internal Medicine | Admitting: Internal Medicine

## 2019-11-05 ENCOUNTER — Encounter (HOSPITAL_COMMUNITY)
Admission: RE | Disposition: A | Discharge status: Home / Self Care | Payer: Self-pay | Source: Ambulatory Visit | Attending: Internal Medicine

## 2019-11-05 DIAGNOSIS — Z87891 Personal history of nicotine dependence: Secondary | ICD-10-CM | POA: Diagnosis not present

## 2019-11-05 DIAGNOSIS — D123 Benign neoplasm of transverse colon: Secondary | ICD-10-CM | POA: Diagnosis not present

## 2019-11-05 DIAGNOSIS — Z79890 Hormone replacement therapy (postmenopausal): Secondary | ICD-10-CM | POA: Diagnosis not present

## 2019-11-05 DIAGNOSIS — Z7982 Long term (current) use of aspirin: Secondary | ICD-10-CM | POA: Insufficient documentation

## 2019-11-05 DIAGNOSIS — D124 Benign neoplasm of descending colon: Secondary | ICD-10-CM | POA: Diagnosis not present

## 2019-11-05 DIAGNOSIS — E039 Hypothyroidism, unspecified: Secondary | ICD-10-CM | POA: Insufficient documentation

## 2019-11-05 DIAGNOSIS — I10 Essential (primary) hypertension: Secondary | ICD-10-CM | POA: Insufficient documentation

## 2019-11-05 DIAGNOSIS — E785 Hyperlipidemia, unspecified: Secondary | ICD-10-CM | POA: Insufficient documentation

## 2019-11-05 DIAGNOSIS — G4730 Sleep apnea, unspecified: Secondary | ICD-10-CM | POA: Insufficient documentation

## 2019-11-05 DIAGNOSIS — K5730 Diverticulosis of large intestine without perforation or abscess without bleeding: Secondary | ICD-10-CM | POA: Insufficient documentation

## 2019-11-05 DIAGNOSIS — K635 Polyp of colon: Secondary | ICD-10-CM

## 2019-11-05 DIAGNOSIS — K621 Rectal polyp: Secondary | ICD-10-CM

## 2019-11-05 DIAGNOSIS — Z79899 Other long term (current) drug therapy: Secondary | ICD-10-CM | POA: Diagnosis not present

## 2019-11-05 DIAGNOSIS — R195 Other fecal abnormalities: Secondary | ICD-10-CM | POA: Insufficient documentation

## 2019-11-05 DIAGNOSIS — G629 Polyneuropathy, unspecified: Secondary | ICD-10-CM | POA: Diagnosis not present

## 2019-11-05 HISTORY — PX: POLYPECTOMY: SHX5525

## 2019-11-05 HISTORY — PX: COLONOSCOPY WITH PROPOFOL: SHX5780

## 2019-11-05 SURGERY — COLONOSCOPY WITH PROPOFOL
Anesthesia: General

## 2019-11-05 MED ORDER — CHLORHEXIDINE GLUCONATE CLOTH 2 % EX PADS: 6.0000 | MEDICATED_PAD | Freq: Once | CUTANEOUS | Status: DC

## 2019-11-05 MED ORDER — KETAMINE HCL 10 MG/ML IJ SOLN
INTRAMUSCULAR | Status: DC | PRN
  Administered 2019-11-05: 20 mg via INTRAVENOUS
  Administered 2019-11-05: 10 mg via INTRAVENOUS

## 2019-11-05 MED ORDER — PROPOFOL 500 MG/50ML IV EMUL
INTRAVENOUS | Status: DC | PRN
  Administered 2019-11-05: 200 ug/kg/min via INTRAVENOUS

## 2019-11-05 MED ORDER — PROPOFOL 10 MG/ML IV BOLUS
INTRAVENOUS | Status: DC | PRN
  Administered 2019-11-05: 50 mg via INTRAVENOUS
  Administered 2019-11-05: 100 mg via INTRAVENOUS

## 2019-11-05 MED ORDER — KETAMINE HCL 50 MG/5ML IJ SOSY
PREFILLED_SYRINGE | INTRAMUSCULAR | Status: AC
  Filled 2019-11-05: qty 5

## 2019-11-05 MED ORDER — LACTATED RINGERS IV SOLN: INTRAVENOUS | Status: DC

## 2019-11-05 MED ORDER — LIDOCAINE HCL (CARDIAC) PF 100 MG/5ML IV SOSY
PREFILLED_SYRINGE | INTRAVENOUS | Status: DC | PRN
  Administered 2019-11-05: 50 mg via INTRATRACHEAL

## 2019-11-05 NOTE — Anesthesia Preprocedure Evaluation (Signed)
Anesthesia Evaluation  Patient identified by MRN, date of birth, ID band Patient awake    Reviewed: Allergy & Precautions, H&P , NPO status , Patient's Chart, lab work & pertinent test results, reviewed documented beta blocker date and time   Airway Mallampati: II  TM Distance: >3 FB Neck ROM: full    Dental no notable dental hx. (+) Edentulous Lower, Edentulous Upper   Pulmonary neg pulmonary ROS, former smoker,    Pulmonary exam normal breath sounds clear to auscultation       Cardiovascular Exercise Tolerance: Good hypertension, negative cardio ROS   Rhythm:regular Rate:Normal     Neuro/Psych  Neuromuscular disease negative psych ROS   GI/Hepatic negative GI ROS, Neg liver ROS,   Endo/Other  Hypothyroidism   Renal/GU CRFRenal disease  negative genitourinary   Musculoskeletal   Abdominal   Peds  Hematology  (+) Blood dyscrasia, anemia ,   Anesthesia Other Findings   Reproductive/Obstetrics negative OB ROS                             Anesthesia Physical Anesthesia Plan  ASA: III  Anesthesia Plan: General   Post-op Pain Management:    Induction:   PONV Risk Score and Plan:   Airway Management Planned:   Additional Equipment:   Intra-op Plan:   Post-operative Plan:   Informed Consent: I have reviewed the patients History and Physical, chart, labs and discussed the procedure including the risks, benefits and alternatives for the proposed anesthesia with the patient or authorized representative who has indicated his/her understanding and acceptance.     Dental Advisory Given  Plan Discussed with: CRNA  Anesthesia Plan Comments:         Anesthesia Quick Evaluation

## 2019-11-05 NOTE — H&P (Signed)
@LOGO @   Primary Care Physician:  Sharion Balloon, FNP Primary Gastroenterologist:  Dr. Gala Romney  Pre-Procedure History & Physical: HPI:  Andrew Phelps is a 74 y.o. male here for further evaluation of Hemoccult positive stool via colonoscopy.  No prior colonoscopy.  Past Medical History:  Diagnosis Date  . Arthritis   . Asthma   . Back pain   . Family history of adverse reaction to anesthesia   . Gout   . Hyperlipidemia   . Hypertension   . Hypothyroidism   . Insomnia   . Joint pain   . Neuropathy   . Sleep apnea    not bad enought to do anyting about  . Thyroid disease     Past Surgical History:  Procedure Laterality Date  . none      Prior to Admission medications   Medication Sig Start Date End Date Taking? Authorizing Provider  allopurinol (ZYLOPRIM) 300 MG tablet Take 1 tablet (300 mg total) by mouth daily. 10/01/19  Yes Hawks, Christy A, FNP  aspirin EC 81 MG tablet Take 81 mg by mouth daily.   Yes [provider]  carbidopa-levodopa (SINEMET CR) 50-200 MG tablet Take by mouth. 08/25/17  Yes [provider]  gabapentin (NEURONTIN) 600 MG tablet Take 1 tablet (600 mg total) by mouth 3 (three) times daily. Needs to be seen for future refills 10/01/19  Yes Hawks, Alyse Low A, FNP  HYDROcodone-acetaminophen (NORCO/VICODIN) 5-325 MG tablet Take 1 tablet by mouth every 12 (twelve) hours as needed for moderate pain. Patient taking differently: Take 1 tablet by mouth 3 (three) times daily as needed for moderate pain.  10/01/19  Yes Hawks, Christy A, FNP  levothyroxine (SYNTHROID) 125 MCG tablet TAKE ONE TABLET BY MOUTH DAILY. Patient taking differently: Take 125 mcg by mouth daily before breakfast.  05/21/19  Yes Hawks, Christy A, FNP  loratadine (CLARITIN) 10 MG tablet TAKE ONE TABLET BY MOUTH EVERY DAY Patient taking differently: Take 10 mg by mouth at bedtime.  08/29/19  Yes Hawks, Christy A, FNP  mometasone (NASONEX) 50 MCG/ACT nasal spray INSTILL TWO  SPRAYS INTO EACH NOSTRIL DAILY 10/25/19  Yes Hawks, Christy A, FNP  montelukast (SINGULAIR) 10 MG tablet TAKE ONE TABLET BY MOUTH AT BEDTIME Patient taking differently: Take 10 mg by mouth daily.  10/09/19  Yes Hawks, Christy A, FNP  Omega-3 Fatty Acids (OMEGA-3 FISH OIL PO) Take 1 capsule by mouth daily.    Yes [provider]  simvastatin (ZOCOR) 20 MG tablet Take 1 tablet (20 mg total) by mouth daily. Patient taking differently: Take 20 mg by mouth at bedtime.  10/01/19  Yes Hawks, Christy A, FNP  Vitamin D, Ergocalciferol, (DRISDOL) 1.25 MG (50000 UNIT) CAPS capsule Take 50,000 Units by mouth every Monday.  10/03/19  Yes [provider]  HYDROcodone-acetaminophen (NORCO) 5-325 MG tablet Take 1 tablet by mouth every 12 (twelve) hours as needed for moderate pain. Patient not taking: Reported on 10/23/2019 10/01/19   Sharion Balloon, FNP  HYDROcodone-acetaminophen (NORCO) 5-325 MG tablet Take 1 tablet by mouth every 12 (twelve) hours as needed for moderate pain. Patient not taking: Reported on 10/23/2019 10/01/19   Sharion Balloon, FNP    Allergies as of 08/24/2019 - Review Complete 08/24/2019  Allergen Reaction Noted  . Codeine  08/03/2017  . Penicillins  06/05/2013    Family History  Problem Relation Age of Onset  . Heart disease Mother   . Heart disease Father   . Hypertension Brother   .  Hyperlipidemia Brother   . Heart disease Sister   . Heart disease Brother   . Heart disease Brother   . Cancer Brother   . Colon cancer Neg Hx   . Liver disease Neg Hx     Social History   Socioeconomic History  . Marital status: Divorced    Spouse name: Not on file  . Number of children: 3  . Years of education: 8   . Highest education level: 8th grade  Occupational History  . Occupation: Retired  Tobacco Use  . Smoking status: Former Smoker    Packs/day: 2.00    Years: 25.00    Pack years: 50.00    Types: Cigarettes    Quit date: 07/06/1995    Years since quitting:  24.3  . Smokeless tobacco: Current User    Types: Chew  Substance and Sexual Activity  . Alcohol use: Not Currently    Comment: stopped 3 months ago  . Drug use: No  . Sexual activity: Yes  Other Topics Concern  . Not on file  Social History Narrative  . Not on file   Social Determinants of Health   Financial Resource Strain: Low Risk   . Difficulty of Paying Living Expenses: Not hard at all  Food Insecurity: No Food Insecurity  . Worried About Charity fundraiser in the Last Year: Never true  . Ran Out of Food in the Last Year: Never true  Transportation Needs: No Transportation Needs  . Lack of Transportation (Medical): No  . Lack of Transportation (Non-Medical): No  Physical Activity: Inactive  . Days of Exercise per Week: 0 days  . Minutes of Exercise per Session: 0 min  Stress: No Stress Concern Present  . Feeling of Stress : Only a little  Social Connections: Slightly Isolated  . Frequency of Communication with Friends and Family: More than three times a week  . Frequency of Social Gatherings with Friends and Family: More than three times a week  . Attends Religious Services: More than 4 times per year  . Active Member of Clubs or Organizations: Yes  . Attends Archivist Meetings: More than 4 times per year  . Marital Status: Divorced  Human resources officer Violence: Not At Risk  . Fear of Current or Ex-Partner: No  . Emotionally Abused: No  . Physically Abused: No  . Sexually Abused: No    Review of Systems: See HPI, otherwise negative ROS  Physical Exam: BP (!) 154/77   Pulse 86   Temp 98.6 F (37 C) (Oral)   Resp 17   Ht 5\' 11"  (1.803 m)   Wt 122.9 kg   SpO2 98%   BMI 37.80 kg/m  General:   Alert,  Well-developed, well-nourished, pleasant and cooperative in NAD Neck:  Supple; no masses or thyromegaly. No significant cervical adenopathy. Lungs:  Clear throughout to auscultation.   No wheezes, crackles, or rhonchi. No acute distress. Heart:   Regular rate and rhythm; no murmurs, clicks, rubs,  or gallops. Abdomen: Non-distended, normal bowel sounds.  Soft and nontender without appreciable mass or hepatosplenomegaly.  Pulses:  Normal pulses noted. Extremities:  Without clubbing or edema.  Impression/Plan: 74 year old gentleman Hemoccult positive stool.  Here for diagnostic colonoscopy. The risks, benefits, limitations, alternatives and imponderables have been reviewed with the patient. Questions have been answered. All parties are agreeable.       Notice: This dictation was prepared with Dragon dictation along with smaller phrase technology. Any transcriptional errors that result  from this process are unintentional and may not be corrected upon review.

## 2019-11-05 NOTE — Anesthesia Postprocedure Evaluation (Signed)
Anesthesia Post Note  Patient: Andrew Phelps  Procedure(s) Performed: COLONOSCOPY WITH PROPOFOL (N/A ) POLYPECTOMY  Patient location during evaluation: Endoscopy Anesthesia Type: General Level of consciousness: awake and alert Pain management: pain level controlled Vital Signs Assessment: post-procedure vital signs reviewed and stable Respiratory status: spontaneous breathing, nonlabored ventilation, respiratory function stable and patient connected to nasal cannula oxygen Cardiovascular status: blood pressure returned to baseline and stable Postop Assessment: no apparent nausea or vomiting Anesthetic complications: no     Last Vitals:  Vitals:   11/05/19 1344 11/05/19 1350  BP: (!) 148/82 (!) 153/86  Pulse: 74 72  Resp: 16   Temp:  (!) 36.3 C  SpO2: 100% 97%    Last Pain:  Vitals:   11/05/19 1350  TempSrc: Oral  PainSc: 0-No pain                 [name]

## 2019-11-05 NOTE — Transfer of Care (Signed)
Immediate Anesthesia Transfer of Care Note  Patient: Andrew Phelps  Procedure(s) Performed: COLONOSCOPY WITH PROPOFOL (N/A ) POLYPECTOMY  Patient Location: PACU  Anesthesia Type:General  Level of Consciousness: awake, alert  and oriented  Airway & Oxygen Therapy: Patient Spontanous Breathing  Post-op Assessment: Report given to RN, Post -op Vital signs reviewed and stable and Patient moving all extremities X 4  Post vital signs: Reviewed and stable  Last Vitals:  Vitals Value Taken Time  BP 117/74 11/05/19 1331  Temp    Pulse    Resp    SpO2    Vitals shown include unvalidated device data.  Last Pain:  Vitals:   11/05/19 1258  TempSrc:   PainSc: 0-No pain         Complications: No apparent anesthesia complications

## 2019-11-05 NOTE — Discharge Instructions (Signed)
Colonoscopy Discharge Instructions  Read the instructions outlined below and refer to this sheet in the next few weeks. These discharge instructions provide you with general information on caring for yourself after you leave the hospital. Your doctor may also give you specific instructions. While your treatment has been planned according to the most current medical practices available, unavoidable complications occasionally occur. If you have any problems or questions after discharge, call Dr. Gala Romney at [phone]. ACTIVITY  You may resume your regular activity, but move at a slower pace for the next 24 hours.   Take frequent rest periods for the next 24 hours.   Walking will help get rid of the air and reduce the bloated feeling in your belly (abdomen).   No driving for 24 hours (because of the medicine (anesthesia) used during the test).    Do not sign any important legal documents or operate any machinery for 24 hours (because of the anesthesia used during the test).  NUTRITION  Drink plenty of fluids.   You may resume your normal diet as instructed by your doctor.   Begin with a light meal and progress to your normal diet. Heavy or fried foods are harder to digest and may make you feel sick to your stomach (nauseated).   Avoid alcoholic beverages for 24 hours or as instructed.  MEDICATIONS  You may resume your normal medications unless your doctor tells you otherwise.  WHAT YOU CAN EXPECT TODAY  Some feelings of bloating in the abdomen.   Passage of more gas than usual.   Spotting of blood in your stool or on the toilet paper.  IF YOU HAD POLYPS REMOVED DURING THE COLONOSCOPY:  No aspirin products for 7 days or as instructed.   No alcohol for 7 days or as instructed.   Eat a soft diet for the next 24 hours.  FINDING OUT THE RESULTS OF YOUR TEST Not all test results are available during your visit. If your test results are not back during the visit, make an appointment  with your caregiver to find out the results. Do not assume everything is normal if you have not heard from your caregiver or the medical facility. It is important for you to follow up on all of your test results.  SEEK IMMEDIATE MEDICAL ATTENTION IF:  You have more than a spotting of blood in your stool.   Your belly is swollen (abdominal distention).   You are nauseated or vomiting.   You have a temperature over 101.   You have abdominal pain or discomfort that is severe or gets worse throughout the day.    Colon polyp and diverticulosis information provided  Further recommendations to follow pending review of pathology report  No future MRI until clip gone  At patient request, I called Andrew Phelps at [phone] and reviewed results.   Colon Polyps  Polyps are tissue growths inside the body. Polyps can grow in many places, including the large intestine (colon). A polyp may be a round bump or a mushroom-shaped growth. You could have one polyp or several. Most colon polyps are noncancerous (benign). However, some colon polyps can become cancerous over time. Finding and removing the polyps early can help prevent this. What are the causes? The exact cause of colon polyps is not known. What increases the risk? You are more likely to develop this condition if you:  Have a family history of colon cancer or colon polyps.  Are older than 50 or older than 45 if  you are African American.  Have inflammatory bowel disease, such as ulcerative colitis or Crohn's disease.  Have certain hereditary conditions, such as: ? Familial adenomatous polyposis. ? Lynch syndrome. ? Turcot syndrome. ? Peutz-Jeghers syndrome.  Are overweight.  Smoke cigarettes.  Do not get enough exercise.  Drink too much alcohol.  Eat a diet that is high in fat and red meat and low in fiber.  Had childhood cancer that was treated with abdominal radiation. What are the signs or symptoms? Most polyps  do not cause symptoms. If you have symptoms, they may include:  Blood coming from your rectum when having a bowel movement.  Blood in your stool. The stool may look dark red or black.  Abdominal pain.  A change in bowel habits, such as constipation or diarrhea. How is this diagnosed? This condition is diagnosed with a colonoscopy. This is a procedure in which a lighted, flexible scope is inserted into the anus and then passed into the colon to examine the area. Polyps are sometimes found when a colonoscopy is done as part of routine cancer screening tests. How is this treated? Treatment for this condition involves removing any polyps that are found. Most polyps can be removed during a colonoscopy. Those polyps will then be tested for cancer. Additional treatment may be needed depending on the results of testing. Follow these instructions at home: Lifestyle  Maintain a healthy weight, or lose weight if recommended by your health care provider.  Exercise every day or as told by your health care provider.  Do not use any products that contain nicotine or tobacco, such as cigarettes and e-cigarettes. If you need help quitting, ask your health care provider.  If you drink alcohol, limit how much you have: ? 0-1 drink a day for women. ? 0-2 drinks a day for men.  Be aware of how much alcohol is in your drink. In the [place] one drink equals one 12 oz bottle of beer (355 mL), one 5 oz glass of wine (148 mL), or one 1 oz shot of hard liquor (44 mL). Eating and drinking   Eat foods that are high in fiber, such as fruits, vegetables, and whole grains.  Eat foods that are high in calcium and vitamin D, such as milk, cheese, yogurt, eggs, liver, fish, and broccoli.  Limit foods that are high in fat, such as fried foods and desserts.  Limit the amount of red meat and processed meat you eat, such as hot dogs, sausage, bacon, and lunch meats. General instructions  Keep all follow-up visits as  told by your health care provider. This is important. ? This includes having regularly scheduled colonoscopies. ? Talk to your health care provider about when you need a colonoscopy. Contact a health care provider if:  You have new or worsening bleeding during a bowel movement.  You have new or increased blood in your stool.  You have a change in bowel habits.  You lose weight for no known reason. Summary  Polyps are tissue growths inside the body. Polyps can grow in many places, including the colon.  Most colon polyps are noncancerous (benign), but some can become cancerous over time.  This condition is diagnosed with a colonoscopy.  Treatment for this condition involves removing any polyps that are found. Most polyps can be removed during a colonoscopy. This information is not intended to replace advice given to you by your health care provider. Make sure you discuss any questions you have with your health  care provider. Document Revised: 09/22/2017 Document Reviewed: 09/22/2017 Elsevier Patient Education  [place].   Diverticulosis  Diverticulosis is a condition that develops when small pouches (diverticula) form in the wall of the large intestine (colon). The colon is where water is absorbed and stool (feces) is formed. The pouches form when the inside layer of the colon pushes through weak spots in the outer layers of the colon. You may have a few pouches or many of them. The pouches usually do not cause problems unless they become inflamed or infected. When this happens, the condition is called diverticulitis. What are the causes? The cause of this condition is not known. What increases the risk? The following factors may make you more likely to develop this condition:  Being older than age 62. Your risk for this condition increases with age. Diverticulosis is rare among people younger than age 72. By age 30, many people have it.  Eating a low-fiber  diet.  Having frequent constipation.  Being overweight.  Not getting enough exercise.  Smoking.  Taking over-the-counter pain medicines, like aspirin and ibuprofen.  Having a family history of diverticulosis. What are the signs or symptoms? In most people, there are no symptoms of this condition. If you do have symptoms, they may include:  Bloating.  Cramps in the abdomen.  Constipation or diarrhea.  Pain in the lower left side of the abdomen. How is this diagnosed? Because diverticulosis usually has no symptoms, it is most often diagnosed during an exam for other colon problems. The condition may be diagnosed by:  Using a flexible scope to examine the colon (colonoscopy).  Taking an X-ray of the colon after dye has been put into the colon (barium enema).  Having a CT scan. How is this treated? You may not need treatment for this condition. Your health care provider may recommend treatment to prevent problems. You may need treatment if you have symptoms or if you previously had diverticulitis. Treatment may include:  Eating a high-fiber diet.  Taking a fiber supplement.  Taking a live bacteria supplement (probiotic).  Taking medicine to relax your colon. Follow these instructions at home: Medicines  Take over-the-counter and prescription medicines only as told by your health care provider.  If told by your health care provider, take a fiber supplement or probiotic. Constipation prevention Your condition may cause constipation. To prevent or treat constipation, you may need to:  Drink enough fluid to keep your urine pale yellow.  Take over-the-counter or prescription medicines.  Eat foods that are high in fiber, such as beans, whole grains, and fresh fruits and vegetables.  Limit foods that are high in fat and processed sugars, such as fried or sweet foods.  General instructions  Try not to strain when you have a bowel movement.  Keep all follow-up visits  as told by your health care provider. This is important. Contact a health care provider if you:  Have pain in your abdomen.  Have bloating.  Have cramps.  Have not had a bowel movement in 3 days. Get help right away if:  Your pain gets worse.  Your bloating becomes very bad.  You have a fever or chills, and your symptoms suddenly get worse.  You vomit.  You have bowel movements that are bloody or black.  You have bleeding from your rectum. Summary  Diverticulosis is a condition that develops when small pouches (diverticula) form in the wall of the large intestine (colon).  You may have a few  pouches or many of them.  This condition is most often diagnosed during an exam for other colon problems.  Treatment may include increasing the fiber in your diet, taking supplements, or taking medicines. This information is not intended to replace advice given to you by your health care provider. Make sure you discuss any questions you have with your health care provider. Document Revised: 01/04/2019 Document Reviewed: 01/04/2019 Elsevier Patient Education  [place].

## 2019-11-05 NOTE — Op Note (Signed)
[facility] Patient Name: Andrew Phelps Procedure Date: 11/05/2019 12:26 PM MRN: [id number] Date of Birth: 12-23-1945 Attending MD: Norvel Richards , MD CSN: [id number] Age: 74 Admit Type: Outpatient Procedure:                Colonoscopy Indications:              Heme positive stool Providers:                Norvel Richards, MD, Otis Peak B. Sharon Seller, RN,                            Aram Candela Referring MD:              Medicines:                Propofol per Anesthesia Complications:            No immediate complications. Estimated Blood Loss:     Estimated blood loss: none. Procedure:                Pre-Anesthesia Assessment:                           - Prior to the procedure, a History and Physical                            was performed, and patient medications and                            allergies were reviewed. The patient's tolerance of                            previous anesthesia was also reviewed. The risks                            and benefits of the procedure and the sedation                            options and risks were discussed with the patient.                            All questions were answered, and informed consent                            was obtained. Prior Anticoagulants: The patient has                            taken no previous anticoagulant or antiplatelet                            agents. ASA Grade Assessment: II - A patient with                            mild systemic disease. After reviewing the risks  and benefits, the patient was deemed in                            satisfactory condition to undergo the procedure.                           After obtaining informed consent, the colonoscope                            was passed under direct vision. Throughout the                            procedure, the patient's blood pressure, pulse, and                            oxygen saturations were  monitored continuously. The                            [id number] scope was introduced through                            the anus and advanced to the the cecum, identified                            by appendiceal orifice and ileocecal valve. The                            colonoscopy was performed without difficulty. The                            patient tolerated the procedure well. The quality                            of the bowel preparation was adequate. Scope In: 1:03:37 PM Scope Out: 1:24:07 PM Scope Withdrawal Time: 0 hours 14 minutes 43 seconds  Total Procedure Duration: 0 hours 20 minutes 30 seconds  Findings:      The perianal and digital rectal examinations were normal.      Four semi-pedunculated polyps were found in the rectum, sigmoid colon,       descending colon and splenic flexure. The polyps were 5 to 8 mm in size.       These polyps were removed with a cold snare. Resection and retrieval       were complete. Estimated blood loss was minimal.      A 1.5 cm polyp was found in the sigmoid colon. The polyp was       pedunculated. The polyp was removed with a hot snare. Resection and       retrieval were complete. Estimated blood loss: none. Clipped x1 for good       hemostasis      Scattered medium-mouthed diverticula were found in the sigmoid colon and       descending colon.      The exam was otherwise without abnormality on direct and retroflexion       views. Impression:               -  Four 5 to 8 mm polyps in the rectum, in the                            sigmoid colon, in the descending colon and at the                            splenic flexure, removed with a cold snare.                            Resected and retrieved.                           - One 1.5 cm polyp in the sigmoid colon, removed                            with a hot snare. Resected and retrieved. Clip                            placement x1                           - Diverticulosis  in the sigmoid colon and in the                            descending colon.                           - The examination was otherwise normal on direct                            and retroflexion views. Moderate Sedation:      Moderate (conscious) sedation was personally administered by an       anesthesia professional. The following parameters were monitored: oxygen       saturation, heart rate, blood pressure, respiratory rate, EKG, adequacy       of pulmonary ventilation, and response to care. Recommendation:           - Patient has a contact number available for                            emergencies. The signs and symptoms of potential                            delayed complications were discussed with the                            patient. Return to normal activities tomorrow.                            Written discharge instructions were provided to the                            patient.                           -  Resume previous diet.                           - Continue present medications.                           - Repeat colonoscopy date to be determined after                            pending pathology results are reviewed for                            surveillance based on pathology results.                           - Return to GI office (date not yet determined). No                            future MRI until clip gone. Procedure Code(s):        --- Professional ---                           [phone], Colonoscopy, flexible; with removal of                            tumor(s), polyp(s), or other lesion(s) by snare                            technique Diagnosis Code(s):        --- Professional ---                           K62.1, Rectal polyp                           K63.5, Polyp of colon                           R19.5, Other fecal abnormalities                           K57.30, Diverticulosis of large intestine without                            perforation or  abscess without bleeding CPT copyright 2019 [employer]. All rights reserved. The codes documented in this report are preliminary and upon coder review may  be revised to meet current compliance requirements. Cristopher Estimable. Britanny Marksberry, MD Norvel Richards, MD 11/05/2019 1:30:08 PM This report has been signed electronically. Number of Addenda: 0

## 2019-11-06 LAB — SURGICAL PATHOLOGY

## 2019-11-07 ENCOUNTER — Encounter: Payer: Self-pay | Admitting: Internal Medicine

## 2019-11-24 ENCOUNTER — Other Ambulatory Visit: Payer: Self-pay | Admitting: Family

## 2019-11-24 DIAGNOSIS — J302 Other seasonal allergic rhinitis: Secondary | ICD-10-CM

## 2019-11-27 DIAGNOSIS — I129 Hypertensive chronic kidney disease with stage 1 through stage 4 chronic kidney disease, or unspecified chronic kidney disease: Secondary | ICD-10-CM | POA: Diagnosis not present

## 2019-11-27 DIAGNOSIS — E611 Iron deficiency: Secondary | ICD-10-CM | POA: Diagnosis not present

## 2019-11-27 DIAGNOSIS — E559 Vitamin D deficiency, unspecified: Secondary | ICD-10-CM | POA: Diagnosis not present

## 2019-11-27 DIAGNOSIS — G4733 Obstructive sleep apnea (adult) (pediatric): Secondary | ICD-10-CM | POA: Diagnosis not present

## 2019-11-27 DIAGNOSIS — N1832 Chronic kidney disease, stage 3b: Secondary | ICD-10-CM | POA: Diagnosis not present

## 2019-12-05 DIAGNOSIS — N1831 Chronic kidney disease, stage 3a: Secondary | ICD-10-CM | POA: Diagnosis not present

## 2019-12-05 DIAGNOSIS — E871 Hypo-osmolality and hyponatremia: Secondary | ICD-10-CM | POA: Diagnosis not present

## 2019-12-05 DIAGNOSIS — I129 Hypertensive chronic kidney disease with stage 1 through stage 4 chronic kidney disease, or unspecified chronic kidney disease: Secondary | ICD-10-CM | POA: Diagnosis not present

## 2019-12-05 DIAGNOSIS — E559 Vitamin D deficiency, unspecified: Secondary | ICD-10-CM | POA: Diagnosis not present

## 2019-12-05 DIAGNOSIS — E211 Secondary hyperparathyroidism, not elsewhere classified: Secondary | ICD-10-CM | POA: Diagnosis not present

## 2019-12-25 ENCOUNTER — Other Ambulatory Visit: Payer: Self-pay | Admitting: Family

## 2019-12-31 ENCOUNTER — Other Ambulatory Visit: Payer: Self-pay

## 2019-12-31 ENCOUNTER — Encounter: Payer: Self-pay | Admitting: Family

## 2019-12-31 ENCOUNTER — Ambulatory Visit (INDEPENDENT_AMBULATORY_CARE_PROVIDER_SITE_OTHER): Payer: Medicare Other | Admitting: Family

## 2019-12-31 VITALS — BP 141/82 | HR 71 | Temp 98.2°F | Ht 71.0 in | Wt 263.6 lb

## 2019-12-31 DIAGNOSIS — G8929 Other chronic pain: Secondary | ICD-10-CM

## 2019-12-31 DIAGNOSIS — E669 Obesity, unspecified: Secondary | ICD-10-CM

## 2019-12-31 DIAGNOSIS — E039 Hypothyroidism, unspecified: Secondary | ICD-10-CM

## 2019-12-31 DIAGNOSIS — F1120 Opioid dependence, uncomplicated: Secondary | ICD-10-CM

## 2019-12-31 DIAGNOSIS — M1990 Unspecified osteoarthritis, unspecified site: Secondary | ICD-10-CM

## 2019-12-31 DIAGNOSIS — I10 Essential (primary) hypertension: Secondary | ICD-10-CM | POA: Diagnosis not present

## 2019-12-31 DIAGNOSIS — Z0289 Encounter for other administrative examinations: Secondary | ICD-10-CM | POA: Diagnosis not present

## 2019-12-31 DIAGNOSIS — H53451 Other localized visual field defect, right eye: Secondary | ICD-10-CM

## 2019-12-31 DIAGNOSIS — D649 Anemia, unspecified: Secondary | ICD-10-CM

## 2019-12-31 DIAGNOSIS — J45909 Unspecified asthma, uncomplicated: Secondary | ICD-10-CM

## 2019-12-31 DIAGNOSIS — M545 Low back pain: Secondary | ICD-10-CM | POA: Diagnosis not present

## 2019-12-31 DIAGNOSIS — G6289 Other specified polyneuropathies: Secondary | ICD-10-CM

## 2019-12-31 DIAGNOSIS — N1832 Chronic kidney disease, stage 3b: Secondary | ICD-10-CM

## 2019-12-31 DIAGNOSIS — E785 Hyperlipidemia, unspecified: Secondary | ICD-10-CM

## 2019-12-31 DIAGNOSIS — G4700 Insomnia, unspecified: Secondary | ICD-10-CM

## 2019-12-31 MED ORDER — HYDROCODONE-ACETAMINOPHEN 5-325 MG PO TABS: 1.0000 | ORAL_TABLET | Freq: Two times a day (BID) | ORAL | 0 refills | Status: DC | PRN

## 2019-12-31 NOTE — Progress Notes (Signed)
Subjective:    Patient ID: Andrew Phelps, male    DOB: June 30, 1945, 74 y.o.   MRN: [id number]  Chief Complaint  Patient presents with  . Medical Management of Chronic Issues    needs referral to Dr. Merlene Laughter for eye problem seen Dr. Truman Hayward at [facility] in [place]  . Hypertension   PT presents the office today for chronic follow up and pain medication refill.PT is followed by Nephrologists every 3 months. He just had his eye exam last month and was told he had changes with his peripheral vision and needed a referral to a Neurologists.   He reports he has lost 11 lbs since our last visit. He has stopped drinking, sweets, and eating more salads. Hypertension This is a chronic problem. The current episode started more than 1 year ago. The problem has been waxing and waning since onset. The problem is uncontrolled. Pertinent negatives include no malaise/fatigue, peripheral edema or shortness of breath. Risk factors for coronary artery disease include obesity, male gender and sedentary lifestyle. The current treatment provides moderate improvement. There is no history of heart failure. Identifiable causes of hypertension include a thyroid problem.  Asthma There is no cough, difficulty breathing or shortness of breath. This is a chronic problem. The current episode started more than 1 year ago. The problem occurs intermittently. The problem has been resolved. Pertinent negatives include no malaise/fatigue. He reports significant improvement on treatment. His past medical history is significant for asthma.  Thyroid Problem Presents for follow-up visit. Symptoms include constipation. Patient reports no anxiety, dry skin or fatigue. The symptoms have been stable. His past medical history is significant for hyperlipidemia. There is no history of heart failure.  Arthritis Presents for follow-up visit. He complains of pain and stiffness. The symptoms have been stable. Affected locations include the  left foot and right foot (back). His pain is at a severity of 8/10. Pertinent negatives include no fatigue.  Hyperlipidemia This is a chronic problem. The current episode started more than 1 year ago. The problem is controlled. Recent lipid tests were reviewed and are normal. Pertinent negatives include no shortness of breath. Current antihyperlipidemic treatment includes statins. The current treatment provides moderate improvement of lipids. Risk factors for coronary artery disease include dyslipidemia, male sex, hypertension and a sedentary lifestyle.  Insomnia Primary symptoms: difficulty falling asleep, frequent awakening, no malaise/fatigue.  The current episode started more than one year. The onset quality is gradual. The problem occurs intermittently.  Anemia Presents for follow-up visit. There has been no confusion or malaise/fatigue. There is no history of heart failure.  Back Pain This is a chronic problem. The current episode started more than 1 year ago. The problem occurs constantly. The problem has been waxing and waning since onset. The pain is present in the lumbar spine. The quality of the pain is described as aching. The pain is at a severity of 8/10. The pain is moderate. He has tried analgesics for the symptoms. The treatment provided mild relief.      Review of Systems  Constitutional: Negative for fatigue and malaise/fatigue.  Respiratory: Negative for cough and shortness of breath.   Gastrointestinal: Positive for constipation.  Musculoskeletal: Positive for arthritis, back pain and stiffness.  Psychiatric/Behavioral: Negative for confusion. The patient has insomnia. The patient is not nervous/anxious.   All other systems reviewed and are negative.      Objective:   Physical Exam Vitals reviewed.  Constitutional:      General: He is not  in acute distress.    Appearance: He is well-developed. He is obese.  HENT:     Head: Normocephalic.     Right Ear: Tympanic  membrane normal.     Left Ear: Tympanic membrane normal.  Eyes:     General: Visual field deficit present.        Right eye: No discharge.        Left eye: No discharge.     Pupils: Pupils are equal, round, and reactive to light.  Neck:     Thyroid: No thyromegaly.  Cardiovascular:     Rate and Rhythm: Normal rate and regular rhythm.     Heart sounds: Normal heart sounds. No murmur heard.   Pulmonary:     Effort: Pulmonary effort is normal. No respiratory distress.     Breath sounds: Normal breath sounds. No wheezing.  Abdominal:     General: Bowel sounds are normal. There is no distension.     Palpations: Abdomen is soft.     Tenderness: There is no abdominal tenderness.  Musculoskeletal:        General: No tenderness.     Cervical back: Normal range of motion and neck supple.     Comments: Pain in lumbar with flexion  Skin:    General: Skin is warm and dry.     Findings: No erythema or rash.  Neurological:     Mental Status: He is alert and oriented to person, place, and time.     Cranial Nerves: No cranial nerve deficit.     Deep Tendon Reflexes: Reflexes are normal and symmetric.  Psychiatric:        Behavior: Behavior normal.        Thought Content: Thought content normal.        Judgment: Judgment normal.       BP (!) 141/82   Pulse 71   Temp 98.2 F (36.8 C) (Temporal)   Ht 5\' 11"  (1.803 m)   Wt 263 lb 9.6 oz (119.6 kg)   BMI 36.76 kg/m      Assessment & Plan:  Andrew Phelps comes in today with chief complaint of Medical Management of Chronic Issues (needs referral to Dr. Merlene Laughter for eye problem seen Dr. Truman Hayward at [facility] in [place]) and Hypertension   Diagnosis and orders addressed:  1. Arthritis - HYDROcodone-acetaminophen (NORCO/VICODIN) 5-325 MG tablet; Take 1 tablet by mouth every 12 (twelve) hours as needed for moderate pain.  Dispense: 60 tablet; Refill: 0 - HYDROcodone-acetaminophen (NORCO) 5-325 MG tablet; Take 1 tablet by mouth every  12 (twelve) hours as needed for moderate pain.  Dispense: 60 tablet; Refill: 0 - HYDROcodone-acetaminophen (NORCO) 5-325 MG tablet; Take 1 tablet by mouth every 12 (twelve) hours as needed for moderate pain.  Dispense: 60 tablet; Refill: 0  2. Pain medication agreement signed - HYDROcodone-acetaminophen (NORCO/VICODIN) 5-325 MG tablet; Take 1 tablet by mouth every 12 (twelve) hours as needed for moderate pain.  Dispense: 60 tablet; Refill: 0 - ToxASSURE Select 13 (MW), Urine - HYDROcodone-acetaminophen (NORCO) 5-325 MG tablet; Take 1 tablet by mouth every 12 (twelve) hours as needed for moderate pain.  Dispense: 60 tablet; Refill: 0 - HYDROcodone-acetaminophen (NORCO) 5-325 MG tablet; Take 1 tablet by mouth every 12 (twelve) hours as needed for moderate pain.  Dispense: 60 tablet; Refill: 0  3. Uncomplicated opioid dependence (HCC) - HYDROcodone-acetaminophen (NORCO/VICODIN) 5-325 MG tablet; Take 1 tablet by mouth every 12 (twelve) hours as needed for moderate pain.  Dispense: 60 tablet;  Refill: 0 - ToxASSURE Select 13 (MW), Urine - HYDROcodone-acetaminophen (NORCO) 5-325 MG tablet; Take 1 tablet by mouth every 12 (twelve) hours as needed for moderate pain.  Dispense: 60 tablet; Refill: 0 - HYDROcodone-acetaminophen (NORCO) 5-325 MG tablet; Take 1 tablet by mouth every 12 (twelve) hours as needed for moderate pain.  Dispense: 60 tablet; Refill: 0  4. Chronic bilateral low back pain without sciatica - HYDROcodone-acetaminophen (NORCO/VICODIN) 5-325 MG tablet; Take 1 tablet by mouth every 12 (twelve) hours as needed for moderate pain.  Dispense: 60 tablet; Refill: 0 - ToxASSURE Select 13 (MW), Urine - HYDROcodone-acetaminophen (NORCO) 5-325 MG tablet; Take 1 tablet by mouth every 12 (twelve) hours as needed for moderate pain.  Dispense: 60 tablet; Refill: 0 - HYDROcodone-acetaminophen (NORCO) 5-325 MG tablet; Take 1 tablet by mouth every 12 (twelve) hours as needed for moderate pain.  Dispense: 60  tablet; Refill: 0  5. Essential hypertension  6. Chronic asthma without complication, unspecified asthma severity, unspecified whether persistent  7. Hypothyroidism, unspecified type  8. Other polyneuropathy  9. Stage 3b chronic kidney disease  10. Anemia, unspecified type  11. Hyperlipidemia, unspecified hyperlipidemia type  12. Insomnia, unspecified type  13. Obesity (BMI 30-39.9)  14. Decreased peripheral vision of right eye - Ambulatory referral to Neurology   Labs pending Patient reviewed in [place] controlled database, no flags noted. Contract and drug screen are up to date.  Health Maintenance reviewed Diet and exercise encouraged  Follow up plan: 3 months    Evelina Dun, FNP

## 2019-12-31 NOTE — Patient Instructions (Signed)
Chronic Kidney Disease, Adult Chronic kidney disease (CKD) occurs when the kidneys become damaged slowly over a long period of time. The kidneys are a pair of organs that do many important jobs in the body, including:  Removing waste and extra fluid from the blood to make urine.  Making hormones that maintain the amount of fluid in tissues and blood vessels.  Maintaining the right amount of fluids and chemicals in the body. A small amount of kidney damage may not cause problems, but a large amount of damage may make it hard or impossible for the kidneys to work the way they should. If steps are not taken to slow down kidney damage or to stop it from getting worse, the kidneys may stop working permanently (end-stage renal disease or ESRD). Most of the time, CKD does not go away, but it can often be controlled. People who have CKD are usually able to live normal lives. What are the causes? The most common causes of this condition are diabetes and high blood pressure (hypertension). Other causes include:  Heart and blood vessel (cardiovascular) disease.  Kidney diseases, such as: ? Glomerulonephritis. ? Interstitial nephritis. ? Polycystic kidney disease. ? Renal vascular disease.  Diseases that affect the immune system.  Genetic diseases.  Medicines that damage the kidneys, such as anti-inflammatory medicines.  Being around or being in contact with poisonous (toxic) substances.  A kidney or urinary infection that occurs again and again (recurs).  Vasculitis. This is swelling or inflammation of the blood vessels.  A problem with urine flow that may be caused by: ? Cancer. ? Having kidney stones more than one time. ? An enlarged prostate, in males. What increases the risk? You are more likely to develop this condition if you:  Are older than age 60.  Are male.  Are African-American, Hispanic, Asian, Pacific Islander, or American Indian.  Are a current or former smoker.   Are obese.  Have a family history of kidney disease or failure.  Often take medicines that are damaging to the kidneys. What are the signs or symptoms? Symptoms of this condition include:  Swelling (edema) of the face, legs, ankles, or feet.  Tiredness (lethargy) and having less energy.  Nausea or vomiting.  Confusion or trouble concentrating.  Problems with urination, such as: ? Painful or burning feeling during urination. ? Decreased urine production. ? Frequent urination, especially at night. ? Bloody urine.  Muscle twitches and cramps, especially in the legs.  Shortness of breath.  Weakness.  Loss of appetite.  Metallic taste in the mouth.  Trouble sleeping.  Dry, itchy skin.  A low blood count (anemia).  Pale lining of the eyelids and surface of the eye (conjunctiva). Symptoms develop slowly and may not be obvious until the kidney damage becomes severe. It is possible to have kidney disease for years without having any symptoms. How is this diagnosed? This condition may be diagnosed based on:  Blood tests.  Urine tests.  Imaging tests, such as an ultrasound or CT scan.  A test in which a sample of tissue is removed from the kidneys to be examined under a microscope (kidney biopsy). These test results will help your health care provider determine how serious the CKD is. How is this treated? There is no cure for most cases of this condition, but treatment usually relieves symptoms and prevents or slows the progression of the disease. Treatment may include:  Making diet changes, which may require you to avoid alcohol, salty foods (sodium),   and foods that are high in potassium, calcium, and protein.  Medicines: ? To lower blood pressure. ? To control blood glucose. ? To relieve anemia. ? To relieve swelling. ? To protect your bones. ? To improve the balance of electrolytes in your blood.  Removing toxic waste from the body through types of dialysis, if  the kidneys can no longer do their job (kidney failure).  Managing any other conditions that are causing your CKD or making it worse. Follow these instructions at home: Medicines  Take over-the-counter and prescription medicines only as told by your health care provider. The dose of some medicines that you take may need to be adjusted.  Do not take any new medicines unless approved by your health care provider. Many medicines can worsen your kidney damage.  Do not take any vitamin and mineral supplements unless approved by your health care provider. Many nutritional supplements can worsen your kidney damage. General instructions  Follow your prescribed diet as told by your health care provider.  Do not use any products that contain nicotine or tobacco, such as cigarettes and e-cigarettes. If you need help quitting, ask your health care provider.  Monitor and track your blood pressure at home. Report changes in your blood pressure as told by your health care provider.  If you are being treated for diabetes, monitor and track your blood sugar (blood glucose) levels as told by your health care provider.  Maintain a healthy weight. If you need help with this, ask your health care provider.  Start or continue an exercise plan. Exercise at least 30 minutes a day, 5 days a week.  Keep your immunizations up to date as told by your health care provider.  Keep all follow-up visits as told by your health care provider. This is important. Where to find more information  American Association of Kidney Patients: www.aakp.org  National Kidney Foundation: www.kidney.org  American Kidney Fund: www.akfinc.org  [employer] [facility]: www.lifeoptions.org and www.kidneyschool.org Contact a health care provider if:  Your symptoms get worse.  You develop new symptoms. Get help right away if:  You develop symptoms of ESRD, which include: ? Headaches. ? Numbness in the hands or  feet. ? Easy bruising. ? Frequent hiccups. ? Chest pain. ? Shortness of breath. ? Lack of menstruation, in women.  You have a fever.  You have decreased urine production.  You have pain or bleeding when you urinate. Summary  Chronic kidney disease (CKD) occurs when the kidneys become damaged slowly over a long period of time.  The most common causes of this condition are diabetes and high blood pressure (hypertension).  There is no cure for most cases of this condition, but treatment usually relieves symptoms and prevents or slows the progression of the disease. Treatment may include a combination of medicines and lifestyle changes. This information is not intended to replace advice given to you by your health care provider. Make sure you discuss any questions you have with your health care provider. Document Revised: 05/20/2017 Document Reviewed: 07/15/2016 Elsevier Patient Education  2020 Elsevier Inc.  

## 2020-01-03 LAB — TOXASSURE SELECT 13 (MW), URINE

## 2020-01-17 ENCOUNTER — Other Ambulatory Visit: Payer: Self-pay | Admitting: Family

## 2020-01-18 ENCOUNTER — Other Ambulatory Visit: Payer: Self-pay | Admitting: Family

## 2020-01-18 DIAGNOSIS — G6289 Other specified polyneuropathies: Secondary | ICD-10-CM

## 2020-02-21 ENCOUNTER — Other Ambulatory Visit: Payer: Self-pay | Admitting: Family

## 2020-02-21 DIAGNOSIS — J302 Other seasonal allergic rhinitis: Secondary | ICD-10-CM

## 2020-02-29 DIAGNOSIS — E871 Hypo-osmolality and hyponatremia: Secondary | ICD-10-CM | POA: Diagnosis not present

## 2020-02-29 DIAGNOSIS — E211 Secondary hyperparathyroidism, not elsewhere classified: Secondary | ICD-10-CM | POA: Diagnosis not present

## 2020-02-29 DIAGNOSIS — I129 Hypertensive chronic kidney disease with stage 1 through stage 4 chronic kidney disease, or unspecified chronic kidney disease: Secondary | ICD-10-CM | POA: Diagnosis not present

## 2020-02-29 DIAGNOSIS — N1831 Chronic kidney disease, stage 3a: Secondary | ICD-10-CM | POA: Diagnosis not present

## 2020-02-29 DIAGNOSIS — E559 Vitamin D deficiency, unspecified: Secondary | ICD-10-CM | POA: Diagnosis not present

## 2020-03-07 DIAGNOSIS — I129 Hypertensive chronic kidney disease with stage 1 through stage 4 chronic kidney disease, or unspecified chronic kidney disease: Secondary | ICD-10-CM | POA: Diagnosis not present

## 2020-03-07 DIAGNOSIS — E559 Vitamin D deficiency, unspecified: Secondary | ICD-10-CM | POA: Diagnosis not present

## 2020-03-07 DIAGNOSIS — E871 Hypo-osmolality and hyponatremia: Secondary | ICD-10-CM | POA: Diagnosis not present

## 2020-03-07 DIAGNOSIS — N1831 Chronic kidney disease, stage 3a: Secondary | ICD-10-CM | POA: Diagnosis not present

## 2020-03-07 DIAGNOSIS — E211 Secondary hyperparathyroidism, not elsewhere classified: Secondary | ICD-10-CM | POA: Diagnosis not present

## 2020-03-25 ENCOUNTER — Other Ambulatory Visit: Payer: Self-pay | Admitting: Family

## 2020-03-27 ENCOUNTER — Other Ambulatory Visit: Payer: Self-pay | Admitting: Family

## 2020-03-27 DIAGNOSIS — G6289 Other specified polyneuropathies: Secondary | ICD-10-CM

## 2020-04-01 ENCOUNTER — Other Ambulatory Visit: Payer: Self-pay

## 2020-04-01 ENCOUNTER — Encounter: Payer: Self-pay | Admitting: Family

## 2020-04-01 ENCOUNTER — Ambulatory Visit (INDEPENDENT_AMBULATORY_CARE_PROVIDER_SITE_OTHER): Payer: Medicare Other | Admitting: Family

## 2020-04-01 VITALS — BP 141/83 | HR 86 | Temp 97.7°F | Ht 71.0 in | Wt 261.0 lb

## 2020-04-01 DIAGNOSIS — M545 Low back pain, unspecified: Secondary | ICD-10-CM | POA: Diagnosis not present

## 2020-04-01 DIAGNOSIS — Z23 Encounter for immunization: Secondary | ICD-10-CM | POA: Diagnosis not present

## 2020-04-01 DIAGNOSIS — I10 Essential (primary) hypertension: Secondary | ICD-10-CM | POA: Diagnosis not present

## 2020-04-01 DIAGNOSIS — E039 Hypothyroidism, unspecified: Secondary | ICD-10-CM

## 2020-04-01 DIAGNOSIS — F1120 Opioid dependence, uncomplicated: Secondary | ICD-10-CM

## 2020-04-01 DIAGNOSIS — J45909 Unspecified asthma, uncomplicated: Secondary | ICD-10-CM

## 2020-04-01 DIAGNOSIS — N1832 Chronic kidney disease, stage 3b: Secondary | ICD-10-CM

## 2020-04-01 DIAGNOSIS — M1990 Unspecified osteoarthritis, unspecified site: Secondary | ICD-10-CM | POA: Diagnosis not present

## 2020-04-01 DIAGNOSIS — Z0289 Encounter for other administrative examinations: Secondary | ICD-10-CM | POA: Diagnosis not present

## 2020-04-01 DIAGNOSIS — E669 Obesity, unspecified: Secondary | ICD-10-CM

## 2020-04-01 DIAGNOSIS — G6289 Other specified polyneuropathies: Secondary | ICD-10-CM

## 2020-04-01 DIAGNOSIS — G4700 Insomnia, unspecified: Secondary | ICD-10-CM

## 2020-04-01 DIAGNOSIS — E785 Hyperlipidemia, unspecified: Secondary | ICD-10-CM

## 2020-04-01 DIAGNOSIS — G8929 Other chronic pain: Secondary | ICD-10-CM

## 2020-04-01 MED ORDER — HYDROCODONE-ACETAMINOPHEN 5-325 MG PO TABS: 1.0000 | ORAL_TABLET | Freq: Two times a day (BID) | ORAL | 0 refills | Status: DC | PRN

## 2020-04-01 NOTE — Progress Notes (Signed)
Subjective:    Patient ID: Andrew Phelps, male    DOB: [birth year], 74 y.o.   MRN: [id number]  Chief Complaint  Patient presents with  . Hypertension   PTpresentsthe office today for chronic follow up and pain medication refill.PT is followed by Nephrologists every 3 months.  Hypertension This is a chronic problem. The current episode started more than 1 year ago. The problem has been resolved since onset. The problem is controlled. Pertinent negatives include no malaise/fatigue, peripheral edema or shortness of breath. Risk factors for coronary artery disease include dyslipidemia, obesity, male gender and sedentary lifestyle. The current treatment provides moderate improvement. There is no history of heart failure. Identifiable causes of hypertension include a thyroid problem.  Asthma He complains of hoarse voice. There is no cough, shortness of breath or wheezing. This is a chronic problem. The current episode started more than 1 year ago. The problem occurs intermittently. Pertinent negatives include no malaise/fatigue. He reports moderate improvement on treatment. His past medical history is significant for asthma.  Arthritis Presents for follow-up visit. He complains of pain and stiffness. The symptoms have been stable. Affected locations include the right ankle, left ankle, right foot and left foot. His pain is at a severity of 9/10. Pertinent negatives include no fatigue.  Insomnia Primary symptoms: difficulty falling asleep, frequent awakening, no malaise/fatigue.  The current episode started more than one year. The onset quality is gradual. The problem occurs intermittently.  Thyroid Problem Presents for follow-up visit. Symptoms include hoarse voice. Patient reports no constipation, depressed mood or fatigue. The symptoms have been stable. There is no history of heart failure.  Neuropathy PT has pain 9 out 10 in bilateral feet pain.     Review of Systems    Constitutional: Negative for fatigue and malaise/fatigue.  HENT: Positive for hoarse voice.   Respiratory: Negative for cough, shortness of breath and wheezing.   Gastrointestinal: Negative for constipation.  Musculoskeletal: Positive for arthritis and stiffness.  Psychiatric/Behavioral: The patient has insomnia.   All other systems reviewed and are negative.      Objective:   Physical Exam Vitals reviewed.  Constitutional:      General: He is not in acute distress.    Appearance: He is well-developed.  HENT:     Head: Normocephalic.     Right Ear: Tympanic membrane normal.     Left Ear: Tympanic membrane normal.  Eyes:     General:        Right eye: No discharge.        Left eye: No discharge.     Pupils: Pupils are equal, round, and reactive to light.  Neck:     Thyroid: No thyromegaly.  Cardiovascular:     Rate and Rhythm: Normal rate and regular rhythm.     Heart sounds: Normal heart sounds. No murmur heard.   Pulmonary:     Effort: Pulmonary effort is normal. No respiratory distress.     Breath sounds: Normal breath sounds. No wheezing.  Abdominal:     General: Bowel sounds are normal. There is no distension.     Palpations: Abdomen is soft.     Tenderness: There is no abdominal tenderness.  Musculoskeletal:        General: No tenderness. Normal range of motion.     Cervical back: Normal range of motion and neck supple.  Skin:    General: Skin is warm and dry.     Findings: No erythema or rash.  Neurological:  Mental Status: He is alert and oriented to person, place, and time.     Cranial Nerves: No cranial nerve deficit.     Deep Tendon Reflexes: Reflexes are normal and symmetric.  Psychiatric:        Behavior: Behavior normal.        Thought Content: Thought content normal.        Judgment: Judgment normal.       BP (!) 141/83   Pulse 86   Temp 97.7 F (36.5 C) (Temporal)   Ht $R'5\' 11"'iO$  (1.803 m)   Wt 261 lb (118.4 kg)   SpO2 93%   BMI 36.40  kg/m      Assessment & Plan:  Andrew Phelps comes in today with chief complaint of Hypertension   Diagnosis and orders addressed:  1. Arthriti - HYDROcodone-acetaminophen (NORCO/VICODIN) 5-325 MG tablet; Take 1 tablet by mouth every 12 (twelve) hours as needed for moderate pain.  Dispense: 60 tablet; Refill: 0 - HYDROcodone-acetaminophen (NORCO) 5-325 MG tablet; Take 1 tablet by mouth every 12 (twelve) hours as needed for moderate pain.  Dispense: 60 tablet; Refill: 0 - HYDROcodone-acetaminophen (NORCO) 5-325 MG tablet; Take 1 tablet by mouth every 12 (twelve) hours as needed for moderate pain.  Dispense: 60 tablet; Refill: 0 - CMP14+EGFR - CBC with Differential/Platelet  2. Pain medication agreement signed - HYDROcodone-acetaminophen (NORCO/VICODIN) 5-325 MG tablet; Take 1 tablet by mouth every 12 (twelve) hours as needed for moderate pain.  Dispense: 60 tablet; Refill: 0 - HYDROcodone-acetaminophen (NORCO) 5-325 MG tablet; Take 1 tablet by mouth every 12 (twelve) hours as needed for moderate pain.  Dispense: 60 tablet; Refill: 0 - HYDROcodone-acetaminophen (NORCO) 5-325 MG tablet; Take 1 tablet by mouth every 12 (twelve) hours as needed for moderate pain.  Dispense: 60 tablet; Refill: 0 - CMP14+EGFR - CBC with Differential/Platelet  3. Uncomplicated opioid dependence (HCC) - HYDROcodone-acetaminophen (NORCO/VICODIN) 5-325 MG tablet; Take 1 tablet by mouth every 12 (twelve) hours as needed for moderate pain.  Dispense: 60 tablet; Refill: 0 - HYDROcodone-acetaminophen (NORCO) 5-325 MG tablet; Take 1 tablet by mouth every 12 (twelve) hours as needed for moderate pain.  Dispense: 60 tablet; Refill: 0 - HYDROcodone-acetaminophen (NORCO) 5-325 MG tablet; Take 1 tablet by mouth every 12 (twelve) hours as needed for moderate pain.  Dispense: 60 tablet; Refill: 0 - CMP14+EGFR - CBC with Differential/Platelet  4. Chronic bilateral low back pain without sciatica -  HYDROcodone-acetaminophen (NORCO/VICODIN) 5-325 MG tablet; Take 1 tablet by mouth every 12 (twelve) hours as needed for moderate pain.  Dispense: 60 tablet; Refill: 0 - HYDROcodone-acetaminophen (NORCO) 5-325 MG tablet; Take 1 tablet by mouth every 12 (twelve) hours as needed for moderate pain.  Dispense: 60 tablet; Refill: 0 - HYDROcodone-acetaminophen (NORCO) 5-325 MG tablet; Take 1 tablet by mouth every 12 (twelve) hours as needed for moderate pain.  Dispense: 60 tablet; Refill: 0 - CMP14+EGFR - CBC with Differential/Platelet  5. Essential hypertension - CMP14+EGFR - CBC with Differential/Platelet  6. Chronic asthma without complication, unspecified asthma severity, unspecified whether persistent - CMP14+EGFR - CBC with Differential/Platelet  7. Hypothyroidism, unspecified type - CMP14+EGFR - CBC with Differential/Platelet - TSH  8. Other polyneuropathy - CMP14+EGFR - CBC with Differential/Platelet  9. Stage 3b chronic kidney disease (HCC) - CMP14+EGFR - CBC with Differential/Platelet  10. Hyperlipidemia, unspecified hyperlipidemia type - CMP14+EGFR - CBC with Differential/Platelet  11. Insomnia, unspecified type - CMP14+EGFR - CBC with Differential/Platelet  12. Obesity (BMI 30-39.9) - CMP14+EGFR - CBC with Differential/Platelet  13. Need for immunization against influenza - Flu Vaccine QUAD High Dose(Fluad) - CMP14+EGFR - CBC with Differential/Platelet   Labs pending Patient reviewed in [place] controlled database, no flags noted. Contract and drug screen are up to date.  Health Maintenance reviewed Diet and exercise encouraged  Follow up plan: 3 months    Evelina Dun, FNP

## 2020-04-01 NOTE — Patient Instructions (Signed)

## 2020-04-02 LAB — CBC WITH DIFFERENTIAL/PLATELET

## 2020-04-02 LAB — SPECIMEN STATUS REPORT

## 2020-04-03 ENCOUNTER — Other Ambulatory Visit: Payer: Self-pay | Admitting: Family

## 2020-04-03 LAB — CMP14+EGFR
ALT: 24 IU/L (ref 0–44)
AST: 22 IU/L (ref 0–40)
Albumin/Globulin Ratio: 1.1 — ABNORMAL LOW (ref 1.2–2.2)
Albumin: 3.8 g/dL (ref 3.7–4.7)
Alkaline Phosphatase: 121 IU/L (ref 44–121)
BUN/Creatinine Ratio: 6 — ABNORMAL LOW (ref 10–24)
BUN: 9 mg/dL (ref 8–27)
Bilirubin Total: 1.4 mg/dL — ABNORMAL HIGH (ref 0.0–1.2)
CO2: 25 mmol/L (ref 20–29)
Calcium: 9.1 mg/dL (ref 8.6–10.2)
Chloride: 99 mmol/L (ref 96–106)
Creatinine, Ser: 1.46 mg/dL — ABNORMAL HIGH (ref 0.76–1.27)
GFR calc Af Amer: 55 mL/min/{1.73_m2} — ABNORMAL LOW (ref >59)
GFR calc non Af Amer: 47 mL/min/{1.73_m2} — ABNORMAL LOW (ref >59)
Globulin, Total: 3.6 g/dL (ref 1.5–4.5)
Glucose: 88 mg/dL (ref 65–99)
Potassium: 4.5 mmol/L (ref 3.5–5.2)
Sodium: 137 mmol/L (ref 134–144)
Total Protein: 7.4 g/dL (ref 6.0–8.5)

## 2020-04-03 LAB — CBC WITH DIFFERENTIAL/PLATELET
Basophils Absolute: 0.1 10*3/uL (ref 0.0–0.2)
Basos: 1 %
EOS (ABSOLUTE): 0.3 10*3/uL (ref 0.0–0.4)
Eos: 4 %
Hematocrit: 44 % (ref 37.5–51.0)
Hemoglobin: 13.9 g/dL (ref 13.0–17.7)
Immature Grans (Abs): 0 10*3/uL (ref 0.0–0.1)
Immature Granulocytes: 0 %
Lymphocytes Absolute: 1.7 10*3/uL (ref 0.7–3.1)
Lymphs: 18 %
MCH: 28.5 pg (ref 26.6–33.0)
MCHC: 31.6 g/dL (ref 31.5–35.7)
MCV: 90 fL (ref 79–97)
Monocytes Absolute: 0.5 10*3/uL (ref 0.1–0.9)
Monocytes: 5 %
Neutrophils Absolute: 6.8 10*3/uL (ref 1.4–7.0)
Neutrophils: 72 %
Platelets: 230 10*3/uL (ref 150–450)
RBC: 4.88 x10E6/uL (ref 4.14–5.80)
RDW: 13.6 % (ref 11.6–15.4)
WBC: 9.4 10*3/uL (ref 3.4–10.8)

## 2020-04-03 LAB — TSH: TSH: 0.279 u[IU]/mL — ABNORMAL LOW (ref 0.450–4.500)

## 2020-04-03 MED ORDER — LEVOTHYROXINE SODIUM 112 MCG PO TABS
112.0000 ug | ORAL_TABLET | Freq: Every day | ORAL | 11 refills | Status: DC
Start: 2020-04-03 — End: 2021-01-01

## 2020-04-23 ENCOUNTER — Other Ambulatory Visit: Payer: Self-pay | Admitting: Family

## 2020-05-20 ENCOUNTER — Other Ambulatory Visit: Payer: Self-pay | Admitting: Family

## 2020-05-20 DIAGNOSIS — G6289 Other specified polyneuropathies: Secondary | ICD-10-CM

## 2020-05-22 ENCOUNTER — Other Ambulatory Visit: Payer: Self-pay | Admitting: Family

## 2020-05-22 DIAGNOSIS — J302 Other seasonal allergic rhinitis: Secondary | ICD-10-CM

## 2020-06-06 ENCOUNTER — Other Ambulatory Visit (HOSPITAL_COMMUNITY)
Admission: RE | Admit: 2020-06-06 | Discharge: 2020-06-06 | Disposition: A | Discharge status: Home / Self Care | Payer: Medicare Other | Source: Ambulatory Visit | Attending: Nephrology | Admitting: Nephrology

## 2020-06-06 ENCOUNTER — Other Ambulatory Visit: Payer: Self-pay

## 2020-06-06 DIAGNOSIS — I129 Hypertensive chronic kidney disease with stage 1 through stage 4 chronic kidney disease, or unspecified chronic kidney disease: Secondary | ICD-10-CM | POA: Insufficient documentation

## 2020-06-06 DIAGNOSIS — E559 Vitamin D deficiency, unspecified: Secondary | ICD-10-CM | POA: Insufficient documentation

## 2020-06-06 DIAGNOSIS — N1831 Chronic kidney disease, stage 3a: Secondary | ICD-10-CM | POA: Diagnosis not present

## 2020-06-06 DIAGNOSIS — E871 Hypo-osmolality and hyponatremia: Secondary | ICD-10-CM | POA: Diagnosis not present

## 2020-06-06 DIAGNOSIS — E211 Secondary hyperparathyroidism, not elsewhere classified: Secondary | ICD-10-CM | POA: Insufficient documentation

## 2020-06-06 LAB — RENAL FUNCTION PANEL
Albumin: 3.6 g/dL (ref 3.5–5.0)
Anion gap: 8 (ref 5–15)
BUN: 15 mg/dL (ref 8–23)
CO2: 27 mmol/L (ref 22–32)
Calcium: 8.8 mg/dL — ABNORMAL LOW (ref 8.9–10.3)
Chloride: 100 mmol/L (ref 98–111)
Creatinine, Ser: 1.44 mg/dL — ABNORMAL HIGH (ref 0.61–1.24)
GFR, Estimated: 52 mL/min — ABNORMAL LOW (ref >60)
Glucose, Bld: 102 mg/dL — ABNORMAL HIGH (ref 70–99)
Phosphorus: 4 mg/dL (ref 2.5–4.6)
Potassium: 4 mmol/L (ref 3.5–5.1)
Sodium: 135 mmol/L (ref 135–145)

## 2020-06-06 LAB — CBC
HCT: 41.1 % (ref 39.0–52.0)
Hemoglobin: 13.3 g/dL (ref 13.0–17.0)
MCH: 29.4 pg (ref 26.0–34.0)
MCHC: 32.4 g/dL (ref 30.0–36.0)
MCV: 90.9 fL (ref 80.0–100.0)
Platelets: 174 10*3/uL (ref 150–400)
RBC: 4.52 MIL/uL (ref 4.22–5.81)
RDW: 14.9 % (ref 11.5–15.5)
WBC: 7.2 10*3/uL (ref 4.0–10.5)
nRBC: 0 % (ref 0.0–0.2)

## 2020-06-07 LAB — MICROALBUMIN / CREATININE URINE RATIO
Creatinine, Urine: 52.6 mg/dL
Microalb Creat Ratio: <6 mg/g creat (ref 0–29)
Microalb, Ur: <3 ug/mL — ABNORMAL HIGH

## 2020-06-19 ENCOUNTER — Other Ambulatory Visit: Payer: Self-pay | Admitting: Family

## 2020-06-19 DIAGNOSIS — G6289 Other specified polyneuropathies: Secondary | ICD-10-CM

## 2020-06-20 ENCOUNTER — Other Ambulatory Visit: Payer: Self-pay | Admitting: Family

## 2020-07-02 ENCOUNTER — Other Ambulatory Visit: Payer: Self-pay

## 2020-07-02 ENCOUNTER — Encounter: Payer: Self-pay | Admitting: Family

## 2020-07-02 ENCOUNTER — Ambulatory Visit (INDEPENDENT_AMBULATORY_CARE_PROVIDER_SITE_OTHER): Payer: Medicare Other | Admitting: Family

## 2020-07-02 ENCOUNTER — Telehealth: Payer: Self-pay

## 2020-07-02 VITALS — BP 137/74 | HR 78 | Temp 98.6°F | Ht 71.0 in | Wt 269.6 lb

## 2020-07-02 DIAGNOSIS — I10 Essential (primary) hypertension: Secondary | ICD-10-CM

## 2020-07-02 DIAGNOSIS — M1990 Unspecified osteoarthritis, unspecified site: Secondary | ICD-10-CM

## 2020-07-02 DIAGNOSIS — N1832 Chronic kidney disease, stage 3b: Secondary | ICD-10-CM | POA: Diagnosis not present

## 2020-07-02 DIAGNOSIS — E039 Hypothyroidism, unspecified: Secondary | ICD-10-CM | POA: Diagnosis not present

## 2020-07-02 DIAGNOSIS — Z0289 Encounter for other administrative examinations: Secondary | ICD-10-CM

## 2020-07-02 DIAGNOSIS — E669 Obesity, unspecified: Secondary | ICD-10-CM

## 2020-07-02 DIAGNOSIS — M545 Low back pain, unspecified: Secondary | ICD-10-CM | POA: Diagnosis not present

## 2020-07-02 DIAGNOSIS — G4700 Insomnia, unspecified: Secondary | ICD-10-CM | POA: Diagnosis not present

## 2020-07-02 DIAGNOSIS — J45909 Unspecified asthma, uncomplicated: Secondary | ICD-10-CM | POA: Diagnosis not present

## 2020-07-02 DIAGNOSIS — D649 Anemia, unspecified: Secondary | ICD-10-CM

## 2020-07-02 DIAGNOSIS — F1120 Opioid dependence, uncomplicated: Secondary | ICD-10-CM

## 2020-07-02 DIAGNOSIS — E785 Hyperlipidemia, unspecified: Secondary | ICD-10-CM | POA: Diagnosis not present

## 2020-07-02 DIAGNOSIS — G8929 Other chronic pain: Secondary | ICD-10-CM

## 2020-07-02 MED ORDER — HYDROCODONE-ACETAMINOPHEN 5-325 MG PO TABS: 1.0000 | ORAL_TABLET | Freq: Two times a day (BID) | ORAL | 0 refills | Status: DC | PRN

## 2020-07-02 NOTE — Telephone Encounter (Signed)
Ok

## 2020-07-02 NOTE — Progress Notes (Signed)
Subjective:    Patient ID: Andrew Phelps, male    DOB: 08/26/1945, 75 y.o.   MRN: [id number]  Chief Complaint  Patient presents with  . Hypertension    3 mth   . Nasal Congestion    Started yesterday     PTpresentsthe office today for chronic follow up and pain medication refill.PT is followed by Nephrologists every 3 months. Hypertension This is a chronic problem. The current episode started more than 1 year ago. The problem has been waxing and waning since onset. The problem is uncontrolled. Pertinent negatives include no malaise/fatigue, peripheral edema or shortness of breath. Risk factors for coronary artery disease include dyslipidemia, obesity, male gender and sedentary lifestyle. The current treatment provides moderate improvement. There is no history of CVA or heart failure. Identifiable causes of hypertension include a thyroid problem.  Asthma He complains of hoarse voice. There is no cough, shortness of breath or wheezing. This is a chronic problem. The current episode started more than 1 year ago. The problem occurs intermittently. The problem has been waxing and waning. Pertinent negatives include no malaise/fatigue. His past medical history is significant for asthma.  Thyroid Problem Presents for follow-up visit. Symptoms include hoarse voice. Patient reports no cold intolerance, constipation, diarrhea, dry skin, fatigue, hair loss or leg swelling. The symptoms have been stable. His past medical history is significant for hyperlipidemia. There is no history of heart failure.  Arthritis Presents for follow-up visit. He complains of pain and stiffness. The symptoms have been stable. Affected locations include the left knee, right knee, left foot and right foot (back). His pain is at a severity of 9/10. Pertinent negatives include no diarrhea or fatigue.  Insomnia Primary symptoms: difficulty falling asleep, no malaise/fatigue.  The current episode started more than  one year. The onset quality is gradual. The problem occurs intermittently. Past treatments include medication. The treatment provided moderate relief.  Hyperlipidemia This is a chronic problem. The current episode started more than 1 year ago. Exacerbating diseases include obesity. Pertinent negatives include no shortness of breath. Current antihyperlipidemic treatment includes statins. The current treatment provides moderate improvement of lipids. Risk factors for coronary artery disease include dyslipidemia, male sex, hypertension and a sedentary lifestyle.  Back Pain This is a chronic problem. The current episode started more than 1 year ago. The problem occurs intermittently. The problem has been waxing and waning since onset. The pain is present in the lumbar spine. The quality of the pain is described as aching. The pain is at a severity of 8/10. The pain is moderate. He has tried NSAIDs for the symptoms. The treatment provided moderate relief.  Anemia Presents for follow-up visit. There has been no confusion or malaise/fatigue. There is no history of heart failure.      Review of Systems  Constitutional: Negative for fatigue and malaise/fatigue.  HENT: Positive for hoarse voice.   Respiratory: Negative for cough, shortness of breath and wheezing.   Gastrointestinal: Negative for constipation and diarrhea.  Endocrine: Negative for cold intolerance.  Musculoskeletal: Positive for arthritis, back pain and stiffness.  Psychiatric/Behavioral: Negative for confusion. The patient has insomnia.   All other systems reviewed and are negative.      Objective:   Physical Exam Vitals reviewed.  Constitutional:      General: He is not in acute distress.    Appearance: He is well-developed and well-nourished.  HENT:     Head: Normocephalic.     Right Ear: Tympanic membrane normal.  Left Ear: Tympanic membrane normal.     Mouth/Throat:     Mouth: Oropharynx is clear and moist.  Eyes:      General:        Right eye: No discharge.        Left eye: No discharge.     Pupils: Pupils are equal, round, and reactive to light.  Neck:     Thyroid: No thyromegaly.  Cardiovascular:     Rate and Rhythm: Normal rate and regular rhythm.     Pulses: Intact distal pulses.     Heart sounds: Normal heart sounds. No murmur heard.   Pulmonary:     Effort: Pulmonary effort is normal. No respiratory distress.     Breath sounds: Normal breath sounds. No wheezing.  Abdominal:     General: Bowel sounds are normal. There is no distension.     Palpations: Abdomen is soft.     Tenderness: There is no abdominal tenderness.  Musculoskeletal:        General: No tenderness or edema. Normal range of motion.     Cervical back: Normal range of motion and neck supple.     Comments: Full ROM, pain in lumbar with flexion and extension   Skin:    General: Skin is warm and dry.     Findings: No erythema or rash.  Neurological:     Mental Status: He is alert and oriented to person, place, and time.     Cranial Nerves: No cranial nerve deficit.     Deep Tendon Reflexes: Reflexes are normal and symmetric.  Psychiatric:        Mood and Affect: Mood and affect normal.        Behavior: Behavior normal.        Thought Content: Thought content normal.        Judgment: Judgment normal.       BP (!) 147/73 (BP Location: Left Arm)   Pulse 78   Temp 98.6 F (37 C) (Temporal)   Ht $R'5\' 11"'Rp$  (1.803 m)   Wt 269 lb 9.6 oz (122.3 kg)   SpO2 96%   BMI 37.60 kg/m      Assessment & Plan:  Andrew Phelps comes in today with chief complaint of Hypertension (3 mth ) and Nasal Congestion (Started yesterday )   Diagnosis and orders addressed:  1. Arthritis - HYDROcodone-acetaminophen (NORCO) 5-325 MG tablet; Take 1 tablet by mouth every 12 (twelve) hours as needed for moderate pain.  Dispense: 60 tablet; Refill: 0 - HYDROcodone-acetaminophen (NORCO/VICODIN) 5-325 MG tablet; Take 1 tablet by mouth  every 12 (twelve) hours as needed for moderate pain.  Dispense: 60 tablet; Refill: 0 - HYDROcodone-acetaminophen (NORCO) 5-325 MG tablet; Take 1 tablet by mouth every 12 (twelve) hours as needed for moderate pain.  Dispense: 60 tablet; Refill: 0 - CMP14+EGFR - CBC with Differential/Platelet  2. Pain medication agreement signed - HYDROcodone-acetaminophen (NORCO) 5-325 MG tablet; Take 1 tablet by mouth every 12 (twelve) hours as needed for moderate pain.  Dispense: 60 tablet; Refill: 0 - HYDROcodone-acetaminophen (NORCO/VICODIN) 5-325 MG tablet; Take 1 tablet by mouth every 12 (twelve) hours as needed for moderate pain.  Dispense: 60 tablet; Refill: 0 - HYDROcodone-acetaminophen (NORCO) 5-325 MG tablet; Take 1 tablet by mouth every 12 (twelve) hours as needed for moderate pain.  Dispense: 60 tablet; Refill: 0 - CMP14+EGFR - CBC with Differential/Platelet  3. Uncomplicated opioid dependence (HCC) - HYDROcodone-acetaminophen (NORCO) 5-325 MG tablet; Take 1 tablet by mouth every  12 (twelve) hours as needed for moderate pain.  Dispense: 60 tablet; Refill: 0 - HYDROcodone-acetaminophen (NORCO/VICODIN) 5-325 MG tablet; Take 1 tablet by mouth every 12 (twelve) hours as needed for moderate pain.  Dispense: 60 tablet; Refill: 0 - HYDROcodone-acetaminophen (NORCO) 5-325 MG tablet; Take 1 tablet by mouth every 12 (twelve) hours as needed for moderate pain.  Dispense: 60 tablet; Refill: 0 - CMP14+EGFR - CBC with Differential/Platelet  4. Chronic bilateral low back pain without sciatica - HYDROcodone-acetaminophen (NORCO) 5-325 MG tablet; Take 1 tablet by mouth every 12 (twelve) hours as needed for moderate pain.  Dispense: 60 tablet; Refill: 0 - HYDROcodone-acetaminophen (NORCO/VICODIN) 5-325 MG tablet; Take 1 tablet by mouth every 12 (twelve) hours as needed for moderate pain.  Dispense: 60 tablet; Refill: 0 - HYDROcodone-acetaminophen (NORCO) 5-325 MG tablet; Take 1 tablet by mouth every 12 (twelve) hours  as needed for moderate pain.  Dispense: 60 tablet; Refill: 0 - CMP14+EGFR - CBC with Differential/Platelet  5. Essential hypertension - CMP14+EGFR - CBC with Differential/Platelet  6. Chronic asthma without complication, unspecified asthma severity, unspecified whether persistent - CMP14+EGFR - CBC with Differential/Platelet  7. Hypothyroidism, unspecified type - CMP14+EGFR - CBC with Differential/Platelet - TSH  8. Stage 3b chronic kidney disease (HCC) - CMP14+EGFR - CBC with Differential/Platelet  9. Obesity (BMI 30-39.9) - CMP14+EGFR - CBC with Differential/Platelet  10. Hyperlipidemia, unspecified hyperlipidemia type - CMP14+EGFR - CBC with Differential/Platelet  11. Insomnia, unspecified type - CMP14+EGFR - CBC with Differential/Platelet  12. Anemia, unspecified type - CMP14+EGFR - CBC with Differential/Platelet   Labs pending Patient reviewed in [place] controlled database, no flags noted. Contract and drug screen are up to date.  Health Maintenance reviewed Diet and exercise encouraged  Follow up plan: 3 months    Evelina Dun, FNP

## 2020-07-02 NOTE — Patient Instructions (Signed)

## 2020-07-03 LAB — CMP14+EGFR
ALT: 23 IU/L (ref 0–44)
AST: 24 IU/L (ref 0–40)
Albumin/Globulin Ratio: 1.2 (ref 1.2–2.2)
Albumin: 3.8 g/dL (ref 3.7–4.7)
Alkaline Phosphatase: 105 IU/L (ref 44–121)
BUN/Creatinine Ratio: 9 — ABNORMAL LOW (ref 10–24)
BUN: 14 mg/dL (ref 8–27)
Bilirubin Total: 1.3 mg/dL — ABNORMAL HIGH (ref 0.0–1.2)
CO2: 25 mmol/L (ref 20–29)
Calcium: 9 mg/dL (ref 8.6–10.2)
Chloride: 101 mmol/L (ref 96–106)
Creatinine, Ser: 1.49 mg/dL — ABNORMAL HIGH (ref 0.76–1.27)
GFR calc Af Amer: 53 mL/min/{1.73_m2} — ABNORMAL LOW (ref >59)
GFR calc non Af Amer: 46 mL/min/{1.73_m2} — ABNORMAL LOW (ref >59)
Globulin, Total: 3.2 g/dL (ref 1.5–4.5)
Glucose: 95 mg/dL (ref 65–99)
Potassium: 4.6 mmol/L (ref 3.5–5.2)
Sodium: 139 mmol/L (ref 134–144)
Total Protein: 7 g/dL (ref 6.0–8.5)

## 2020-07-03 LAB — TSH: TSH: 2.76 u[IU]/mL (ref 0.450–4.500)

## 2020-07-03 LAB — CBC WITH DIFFERENTIAL/PLATELET
Basophils Absolute: 0.1 10*3/uL (ref 0.0–0.2)
Basos: 1 %
EOS (ABSOLUTE): 0.4 10*3/uL (ref 0.0–0.4)
Eos: 5 %
Hematocrit: 42.1 % (ref 37.5–51.0)
Hemoglobin: 14.1 g/dL (ref 13.0–17.7)
Immature Grans (Abs): 0 10*3/uL (ref 0.0–0.1)
Immature Granulocytes: 0 %
Lymphocytes Absolute: 1.3 10*3/uL (ref 0.7–3.1)
Lymphs: 19 %
MCH: 28.8 pg (ref 26.6–33.0)
MCHC: 33.5 g/dL (ref 31.5–35.7)
MCV: 86 fL (ref 79–97)
Monocytes Absolute: 0.4 10*3/uL (ref 0.1–0.9)
Monocytes: 6 %
Neutrophils Absolute: 4.8 10*3/uL (ref 1.4–7.0)
Neutrophils: 69 %
Platelets: 175 10*3/uL (ref 150–450)
RBC: 4.9 x10E6/uL (ref 4.14–5.80)
RDW: 14.4 % (ref 11.6–15.4)
WBC: 7 10*3/uL (ref 3.4–10.8)

## 2020-07-21 ENCOUNTER — Other Ambulatory Visit: Payer: Self-pay | Admitting: Family

## 2020-07-21 DIAGNOSIS — G6289 Other specified polyneuropathies: Secondary | ICD-10-CM

## 2020-08-12 ENCOUNTER — Other Ambulatory Visit: Payer: Self-pay | Admitting: Family

## 2020-08-21 ENCOUNTER — Other Ambulatory Visit: Payer: Self-pay | Admitting: Family

## 2020-08-21 DIAGNOSIS — J302 Other seasonal allergic rhinitis: Secondary | ICD-10-CM

## 2020-08-29 DIAGNOSIS — E211 Secondary hyperparathyroidism, not elsewhere classified: Secondary | ICD-10-CM | POA: Diagnosis not present

## 2020-08-29 DIAGNOSIS — N1831 Chronic kidney disease, stage 3a: Secondary | ICD-10-CM | POA: Diagnosis not present

## 2020-08-29 DIAGNOSIS — E871 Hypo-osmolality and hyponatremia: Secondary | ICD-10-CM | POA: Diagnosis not present

## 2020-08-29 DIAGNOSIS — I129 Hypertensive chronic kidney disease with stage 1 through stage 4 chronic kidney disease, or unspecified chronic kidney disease: Secondary | ICD-10-CM | POA: Diagnosis not present

## 2020-08-29 DIAGNOSIS — E559 Vitamin D deficiency, unspecified: Secondary | ICD-10-CM | POA: Diagnosis not present

## 2020-09-04 DIAGNOSIS — R600 Localized edema: Secondary | ICD-10-CM | POA: Diagnosis not present

## 2020-09-04 DIAGNOSIS — E559 Vitamin D deficiency, unspecified: Secondary | ICD-10-CM | POA: Diagnosis not present

## 2020-09-04 DIAGNOSIS — I129 Hypertensive chronic kidney disease with stage 1 through stage 4 chronic kidney disease, or unspecified chronic kidney disease: Secondary | ICD-10-CM | POA: Diagnosis not present

## 2020-09-04 DIAGNOSIS — N1831 Chronic kidney disease, stage 3a: Secondary | ICD-10-CM | POA: Diagnosis not present

## 2020-09-04 DIAGNOSIS — E211 Secondary hyperparathyroidism, not elsewhere classified: Secondary | ICD-10-CM | POA: Diagnosis not present

## 2020-09-05 ENCOUNTER — Other Ambulatory Visit (HOSPITAL_COMMUNITY): Payer: Self-pay | Admitting: Nephrology

## 2020-09-05 DIAGNOSIS — I129 Hypertensive chronic kidney disease with stage 1 through stage 4 chronic kidney disease, or unspecified chronic kidney disease: Secondary | ICD-10-CM

## 2020-09-05 DIAGNOSIS — R600 Localized edema: Secondary | ICD-10-CM

## 2020-09-18 ENCOUNTER — Other Ambulatory Visit: Payer: Self-pay | Admitting: Family

## 2020-09-18 DIAGNOSIS — G6289 Other specified polyneuropathies: Secondary | ICD-10-CM

## 2020-09-19 DIAGNOSIS — N1831 Chronic kidney disease, stage 3a: Secondary | ICD-10-CM | POA: Diagnosis not present

## 2020-09-19 DIAGNOSIS — E211 Secondary hyperparathyroidism, not elsewhere classified: Secondary | ICD-10-CM | POA: Diagnosis not present

## 2020-09-19 DIAGNOSIS — R600 Localized edema: Secondary | ICD-10-CM | POA: Diagnosis not present

## 2020-09-19 DIAGNOSIS — E871 Hypo-osmolality and hyponatremia: Secondary | ICD-10-CM | POA: Diagnosis not present

## 2020-09-19 DIAGNOSIS — I129 Hypertensive chronic kidney disease with stage 1 through stage 4 chronic kidney disease, or unspecified chronic kidney disease: Secondary | ICD-10-CM | POA: Diagnosis not present

## 2020-09-25 ENCOUNTER — Other Ambulatory Visit: Payer: Self-pay

## 2020-09-25 ENCOUNTER — Ambulatory Visit (HOSPITAL_COMMUNITY)
Admission: RE | Admit: 2020-09-25 | Discharge: 2020-09-25 | Disposition: A | Discharge status: Home / Self Care | Payer: Medicare Other | Source: Ambulatory Visit | Attending: Nephrology | Admitting: Nephrology

## 2020-09-25 DIAGNOSIS — I129 Hypertensive chronic kidney disease with stage 1 through stage 4 chronic kidney disease, or unspecified chronic kidney disease: Secondary | ICD-10-CM | POA: Diagnosis not present

## 2020-09-25 DIAGNOSIS — R600 Localized edema: Secondary | ICD-10-CM | POA: Insufficient documentation

## 2020-09-25 LAB — ECHOCARDIOGRAM COMPLETE
AR max vel: 3.03 cm2
AV Area VTI: 4 cm2
AV Area mean vel: 3.05 cm2
AV Mean grad: 2.7 mmHg
AV Peak grad: 5.5 mmHg
Ao pk vel: 1.18 m/s
Area-P 1/2: 2.97 cm2
S' Lateral: 2.6 cm

## 2020-09-25 NOTE — Progress Notes (Signed)
*  PRELIMINARY RESULTS* Echocardiogram 2D Echocardiogram has been performed.  Andrew Phelps 09/25/2020, 10:20 AM

## 2020-09-30 ENCOUNTER — Encounter: Payer: Self-pay | Admitting: Family

## 2020-09-30 ENCOUNTER — Ambulatory Visit (INDEPENDENT_AMBULATORY_CARE_PROVIDER_SITE_OTHER): Payer: Medicare Other | Admitting: Family

## 2020-09-30 ENCOUNTER — Other Ambulatory Visit: Payer: Self-pay

## 2020-09-30 VITALS — BP 128/69 | HR 70 | Temp 99.3°F | Ht 71.0 in | Wt 271.2 lb

## 2020-09-30 DIAGNOSIS — M545 Low back pain, unspecified: Secondary | ICD-10-CM

## 2020-09-30 DIAGNOSIS — N1832 Chronic kidney disease, stage 3b: Secondary | ICD-10-CM | POA: Diagnosis not present

## 2020-09-30 DIAGNOSIS — F1120 Opioid dependence, uncomplicated: Secondary | ICD-10-CM | POA: Diagnosis not present

## 2020-09-30 DIAGNOSIS — G4700 Insomnia, unspecified: Secondary | ICD-10-CM | POA: Diagnosis not present

## 2020-09-30 DIAGNOSIS — E039 Hypothyroidism, unspecified: Secondary | ICD-10-CM

## 2020-09-30 DIAGNOSIS — E785 Hyperlipidemia, unspecified: Secondary | ICD-10-CM | POA: Diagnosis not present

## 2020-09-30 DIAGNOSIS — E669 Obesity, unspecified: Secondary | ICD-10-CM

## 2020-09-30 DIAGNOSIS — M1990 Unspecified osteoarthritis, unspecified site: Secondary | ICD-10-CM | POA: Diagnosis not present

## 2020-09-30 DIAGNOSIS — Z0289 Encounter for other administrative examinations: Secondary | ICD-10-CM

## 2020-09-30 DIAGNOSIS — I10 Essential (primary) hypertension: Secondary | ICD-10-CM | POA: Diagnosis not present

## 2020-09-30 DIAGNOSIS — G8929 Other chronic pain: Secondary | ICD-10-CM

## 2020-09-30 DIAGNOSIS — J45909 Unspecified asthma, uncomplicated: Secondary | ICD-10-CM | POA: Diagnosis not present

## 2020-09-30 MED ORDER — HYDROCODONE-ACETAMINOPHEN 5-325 MG PO TABS: 1.0000 | ORAL_TABLET | Freq: Two times a day (BID) | ORAL | 0 refills | Status: DC | PRN

## 2020-09-30 NOTE — Progress Notes (Signed)
Subjective:    Patient ID: Andrew Phelps, male    DOB: 04/19/1946, 75 y.o.   MRN: [id number]  Chief Complaint  Patient presents with  . Hypertension    3 month follow up    PTpresentsthe office today for chronic follow up and pain medication refill.PT is followed by Nephrologists every 3 months. Hypertension This is a chronic problem. The current episode started more than 1 year ago. The problem has been resolved since onset. The problem is controlled. Associated symptoms include peripheral edema. Pertinent negatives include no malaise/fatigue or shortness of breath. Risk factors for coronary artery disease include dyslipidemia, obesity and male gender. Identifiable causes of hypertension include a thyroid problem.  Asthma There is no cough, shortness of breath or wheezing. This is a chronic problem. The current episode started more than 1 year ago. The problem occurs intermittently. The problem has been waxing and waning. Pertinent negatives include no malaise/fatigue. He reports moderate improvement on treatment. His past medical history is significant for asthma.  Thyroid Problem Presents for follow-up visit. Symptoms include fatigue. Patient reports no cold intolerance, depressed mood, leg swelling or nail problem. The symptoms have been stable. His past medical history is significant for hyperlipidemia.  Arthritis Presents for follow-up visit. He complains of pain and stiffness. The symptoms have been stable. Affected locations include the left knee, right knee, left MCP and right MCP (back). His pain is at a severity of 9/10. Associated symptoms include fatigue.  Insomnia Primary symptoms: difficulty falling asleep, frequent awakening, no malaise/fatigue.  The current episode started more than one year. The problem occurs intermittently.  Hyperlipidemia This is a chronic problem. The current episode started more than 1 year ago. Exacerbating diseases include obesity.  Pertinent negatives include no shortness of breath. The current treatment provides moderate improvement of lipids. Risk factors for coronary artery disease include dyslipidemia, hypertension, male sex and a sedentary lifestyle.      Review of Systems  Constitutional: Positive for fatigue. Negative for malaise/fatigue.  Respiratory: Negative for cough, shortness of breath and wheezing.   Endocrine: Negative for cold intolerance.  Musculoskeletal: Positive for arthritis and stiffness.  Psychiatric/Behavioral: The patient has insomnia.   All other systems reviewed and are negative.      Objective:   Physical Exam Vitals reviewed.  Constitutional:      General: He is not in acute distress.    Appearance: He is well-developed.  HENT:     Head: Normocephalic.     Right Ear: Tympanic membrane normal.     Left Ear: Tympanic membrane normal.  Eyes:     General:        Right eye: No discharge.        Left eye: No discharge.     Pupils: Pupils are equal, round, and reactive to light.  Neck:     Thyroid: No thyromegaly.  Cardiovascular:     Rate and Rhythm: Normal rate and regular rhythm.     Heart sounds: Normal heart sounds. No murmur heard.   Pulmonary:     Effort: Pulmonary effort is normal. No respiratory distress.     Breath sounds: Normal breath sounds. No wheezing.  Abdominal:     General: Bowel sounds are normal. There is no distension.     Palpations: Abdomen is soft.     Tenderness: There is no abdominal tenderness.  Musculoskeletal:        General: No tenderness. Normal range of motion.     Cervical back:  Normal range of motion and neck supple.     Right lower leg: Edema (2+) present.     Left lower leg: Edema (2+) present.  Skin:    General: Skin is warm and dry.     Findings: No erythema or rash.  Neurological:     Mental Status: He is alert and oriented to person, place, and time.     Cranial Nerves: No cranial nerve deficit.     Deep Tendon Reflexes:  Reflexes are normal and symmetric.  Psychiatric:        Behavior: Behavior normal.        Thought Content: Thought content normal.        Judgment: Judgment normal.       BP 128/69   Pulse 70   Temp 99.3 F (37.4 C) (Temporal)   Ht $R'5\' 11"'jH$  (1.803 m)   Wt 271 lb 3.2 oz (123 kg)   SpO2 97%   BMI 37.82 kg/m      Assessment & Plan:  Andrew Phelps comes in today with chief complaint of Hypertension (3 month follow up)   Diagnosis and orders addressed:  1. Arthritis - HYDROcodone-acetaminophen (NORCO/VICODIN) 5-325 MG tablet; Take 1 tablet by mouth every 12 (twelve) hours as needed for moderate pain.  Dispense: 60 tablet; Refill: 0 - HYDROcodone-acetaminophen (NORCO) 5-325 MG tablet; Take 1 tablet by mouth every 12 (twelve) hours as needed for moderate pain.  Dispense: 60 tablet; Refill: 0 - HYDROcodone-acetaminophen (NORCO) 5-325 MG tablet; Take 1 tablet by mouth every 12 (twelve) hours as needed for moderate pain.  Dispense: 60 tablet; Refill: 0 - CMP14+EGFR - CBC with Differential/Platelet  2. Pain medication agreement signed - HYDROcodone-acetaminophen (NORCO/VICODIN) 5-325 MG tablet; Take 1 tablet by mouth every 12 (twelve) hours as needed for moderate pain.  Dispense: 60 tablet; Refill: 0 - HYDROcodone-acetaminophen (NORCO) 5-325 MG tablet; Take 1 tablet by mouth every 12 (twelve) hours as needed for moderate pain.  Dispense: 60 tablet; Refill: 0 - HYDROcodone-acetaminophen (NORCO) 5-325 MG tablet; Take 1 tablet by mouth every 12 (twelve) hours as needed for moderate pain.  Dispense: 60 tablet; Refill: 0 - CMP14+EGFR - CBC with Differential/Platelet  3. Uncomplicated opioid dependence (HCC) - HYDROcodone-acetaminophen (NORCO/VICODIN) 5-325 MG tablet; Take 1 tablet by mouth every 12 (twelve) hours as needed for moderate pain.  Dispense: 60 tablet; Refill: 0 - HYDROcodone-acetaminophen (NORCO) 5-325 MG tablet; Take 1 tablet by mouth every 12 (twelve) hours as needed  for moderate pain.  Dispense: 60 tablet; Refill: 0 - HYDROcodone-acetaminophen (NORCO) 5-325 MG tablet; Take 1 tablet by mouth every 12 (twelve) hours as needed for moderate pain.  Dispense: 60 tablet; Refill: 0 - CMP14+EGFR - CBC with Differential/Platelet  4. Chronic bilateral low back pain without sciatica - HYDROcodone-acetaminophen (NORCO/VICODIN) 5-325 MG tablet; Take 1 tablet by mouth every 12 (twelve) hours as needed for moderate pain.  Dispense: 60 tablet; Refill: 0 - HYDROcodone-acetaminophen (NORCO) 5-325 MG tablet; Take 1 tablet by mouth every 12 (twelve) hours as needed for moderate pain.  Dispense: 60 tablet; Refill: 0 - HYDROcodone-acetaminophen (NORCO) 5-325 MG tablet; Take 1 tablet by mouth every 12 (twelve) hours as needed for moderate pain.  Dispense: 60 tablet; Refill: 0 - CMP14+EGFR - CBC with Differential/Platelet  5. Essential hypertension - CMP14+EGFR - CBC with Differential/Platelet  6. Chronic asthma without complication, unspecified asthma severity, unspecified whether persistent - CMP14+EGFR - CBC with Differential/Platelet  7. Hypothyroidism, unspecified type - CMP14+EGFR - CBC with Differential/Platelet  8. Stage  3b chronic kidney disease ([place]) - CMP14+EGFR - CBC with Differential/Platelet  9. Insomnia, unspecified type - CMP14+EGFR - CBC with Differential/Platelet  10. Hyperlipidemia, unspecified hyperlipidemia type  - CMP14+EGFR - CBC with Differential/Platelet  11. Obesity (BMI 30-39.9) - CMP14+EGFR - CBC with Differential/Platelet   Labs pending Patient reviewed in  controlled database, no flags noted. Contract and drug screen are up to date.  Health Maintenance reviewed Diet and exercise encouraged  Follow up plan: 3 months    Evelina Dun, FNP

## 2020-09-30 NOTE — Patient Instructions (Signed)
Heart Failure, Diagnosis  Heart failure is a condition in which the heart has trouble pumping blood. This may mean that the heart cannot pump enough blood out to the body or that the heart does not fill up with enough blood. For some people with heart failure, fluid may back up into the lungs. There may also be swelling (edema) in the lower legs. Heart failure is usually a long-term (chronic) condition. It is important for you to take good care of yourself and follow the treatment plan from your health care provider. What are the causes? This condition may be caused by:  High blood pressure (hypertension). Hypertension causes the heart muscle to work harder than normal.  Coronary artery disease, or CAD. CAD is the buildup of cholesterol and fat (plaque) in the arteries of the heart.  Heart attack, also called myocardial infarction. This injures the heart muscle, making it hard for the heart to pump blood.  Abnormal heart valves. The valves do not open and close properly, forcing the heart to pump harder to keep the blood flowing.  Heart muscle disease, inflammation, or infection (cardiomyopathy or myocarditis). This is damage to the heart muscle. It can increase the risk of heart failure.  Lung disease. The heart works harder when the lungs are not healthy. What increases the risk? The risk of heart failure increases as a person ages. This condition is also more likely to develop in people who:  Are obese.  Are male.  Use tobacco or nicotine products.  Abuse alcohol or drugs.  Have taken medicines that can damage the heart, such as chemotherapy drugs.  Have any of these conditions: ? Diabetes. ? Abnormal heart rhythms. ? Thyroid problems. ? Low blood counts (anemia). ? Chronic kidney disease.  Have a family history of heart failure. What are the signs or symptoms? Symptoms of this condition include:  Shortness of breath with activity, such as when climbing stairs.  A cough  that does not go away.  Swelling of the feet, ankles, legs, or abdomen.  Losing or gaining weight for no reason.  Trouble breathing when lying flat.  Waking from sleep because of the need to sit up and get more air.  Rapid heartbeat.  Tiredness (fatigue) and loss of energy.  Feeling light-headed, dizzy, or close to fainting.  Nausea or loss of appetite.  Waking up more often during the night to urinate (nocturia).  Confusion. How is this diagnosed? This condition is diagnosed based on:  Your medical history, symptoms, and a physical exam.  Diagnostic tests, which may include: ? Echocardiogram. ? Electrocardiogram (ECG). ? Chest X-ray. ? Blood tests. ? Exercise stress test. ? Cardiac MRI. ? Cardiac catheterization and angiogram. ? Radionuclide scans. How is this treated? Treatment for this condition is aimed at managing the symptoms of heart failure. Medicines Treatment may include medicines that:  Help lower blood pressure by relaxing (dilating) the blood vessels. These medicines are called ACE inhibitors (angiotensin-converting enzyme), ARBs (angiotensin receptor blockers), or vasodilators.  Cause the kidneys to remove salt and water from the blood through urination (diuretics).  Improve heart muscle strength and prevent the heart from beating too fast (beta blockers).  Increase the force of the heartbeat (digoxin).  Lower heart rates. Certain diabetes medicines (SGLT-2 inhibitors) may also be used in treatment. Healthy behavior changes Treatment may also include making healthy lifestyle changes, such as:  Reaching and staying at a healthy weight.  Not using tobacco or nicotine products.  Eating heart-healthy foods.    Limiting or avoiding alcohol.  Stopping the use of illegal drugs.  Being physically active.  Participating in a cardiac rehabilitation program, which is a treatment program to improve your health and well-being through exercise training,  education, and counseling. Other treatments Other treatments may include:  Procedures to open blocked arteries or repair damaged valves.  Placing a pacemaker to improve heart function (cardiac resynchronization therapy).  Placing a device to treat serious abnormal heart rhythms (implantable cardioverter defibrillator, or ICD).  Placing a device to improve the pumping ability of the heart (left ventricular assist device, or LVAD).  Receiving a healthy heart from a donor (heart transplant). This is done when other treatments have not helped. Follow these instructions at home:  Manage other health conditions as told by your health care provider. These may include hypertension, diabetes, thyroid disease, or abnormal heart rhythms.  Get ongoing education and support as needed. Learn as much as you can about heart failure.  Keep all follow-up visits. This is important. Summary  Heart failure is a condition in which the heart has trouble pumping blood.  This condition is commonly caused by high blood pressure and other diseases of the heart and lungs.  Symptoms of this condition include shortness of breath, tiredness (fatigue), nausea, and swelling of the feet, ankles, legs, or abdomen.  Treatments for this condition may include medicines, lifestyle changes, and surgery.  Manage other health conditions as told by your health care provider. This information is not intended to replace advice given to you by your health care provider. Make sure you discuss any questions you have with your health care provider. Document Revised: 12/29/2019 Document Reviewed: 12/29/2019 [employer] Patient Education  2021 [employer]  

## 2020-10-01 LAB — CMP14+EGFR
ALT: 28 IU/L (ref 0–44)
AST: 24 IU/L (ref 0–40)
Albumin/Globulin Ratio: 1.2 (ref 1.2–2.2)
Albumin: 4.3 g/dL (ref 3.7–4.7)
Alkaline Phosphatase: 96 IU/L (ref 44–121)
BUN/Creatinine Ratio: 12 (ref 10–24)
BUN: 21 mg/dL (ref 8–27)
Bilirubin Total: 1.2 mg/dL (ref 0.0–1.2)
CO2: 24 mmol/L (ref 20–29)
Calcium: 9.4 mg/dL (ref 8.6–10.2)
Chloride: 100 mmol/L (ref 96–106)
Creatinine, Ser: 1.71 mg/dL — ABNORMAL HIGH (ref 0.76–1.27)
Globulin, Total: 3.5 g/dL (ref 1.5–4.5)
Glucose: 90 mg/dL (ref 65–99)
Potassium: 4.3 mmol/L (ref 3.5–5.2)
Sodium: 141 mmol/L (ref 134–144)
Total Protein: 7.8 g/dL (ref 6.0–8.5)
eGFR: 42 mL/min/{1.73_m2} — ABNORMAL LOW (ref >59)

## 2020-10-01 LAB — CBC WITH DIFFERENTIAL/PLATELET
Basophils Absolute: 0.1 10*3/uL (ref 0.0–0.2)
Basos: 1 %
EOS (ABSOLUTE): 0.3 10*3/uL (ref 0.0–0.4)
Eos: 3 %
Hematocrit: 43.6 % (ref 37.5–51.0)
Hemoglobin: 14.8 g/dL (ref 13.0–17.7)
Immature Grans (Abs): 0.1 10*3/uL (ref 0.0–0.1)
Immature Granulocytes: 1 %
Lymphocytes Absolute: 2.3 10*3/uL (ref 0.7–3.1)
Lymphs: 26 %
MCH: 30.7 pg (ref 26.6–33.0)
MCHC: 33.9 g/dL (ref 31.5–35.7)
MCV: 91 fL (ref 79–97)
Monocytes Absolute: 0.6 10*3/uL (ref 0.1–0.9)
Monocytes: 7 %
Neutrophils Absolute: 5.6 10*3/uL (ref 1.4–7.0)
Neutrophils: 62 %
Platelets: 173 10*3/uL (ref 150–450)
RBC: 4.82 x10E6/uL (ref 4.14–5.80)
RDW: 14.2 % (ref 11.6–15.4)
WBC: 9 10*3/uL (ref 3.4–10.8)

## 2020-10-07 ENCOUNTER — Ambulatory Visit (INDEPENDENT_AMBULATORY_CARE_PROVIDER_SITE_OTHER): Payer: Medicare Other

## 2020-10-07 DIAGNOSIS — Z0000 Encounter for general adult medical examination without abnormal findings: Secondary | ICD-10-CM | POA: Diagnosis not present

## 2020-10-07 NOTE — Progress Notes (Signed)
MEDICARE ANNUAL WELLNESS VISIT  10/07/2020  Telephone Visit Disclaimer This Medicare AWV was conducted by telephone due to national recommendations for restrictions regarding the COVID-19 Pandemic (e.g. social distancing).  I verified, using two identifiers, that I am speaking with Andrew Phelps or their authorized healthcare agent. I discussed the limitations, risks, security, and privacy concerns of performing an evaluation and management service by telephone and the potential availability of an in-person appointment in the future. The patient expressed understanding and agreed to proceed.  Location of Patient: Home Location of Provider (nurse):  [facility]  Subjective:    Andrew Phelps is a 75 y.o. male patient of Hawks, Theador Hawthorne, FNP who had a Medicare Annual Wellness Visit today via telephone. Andrew Phelps is Retired and lives alone. he has three children. he reports that he is socially active and does interact with friends/family regularly. he is minimally physically active and enjoys hunting.  Patient Care Team: Sharion Balloon, FNP as PCP - General (Family Medicine) Daneil Dolin, MD as Consulting Physician (Gastroenterology)  Advanced Directives 10/07/2020 11/01/2019 10/05/2019 07/14/2017  Does Patient Have a Medical Advance Directive? No No No No  Would patient like information on creating a medical advance directive? No - Patient declined No - Patient declined Yes (MAU/Ambulatory/Procedural Areas - Information given) Yes (MAU/Ambulatory/Procedural Areas - Information given)    Hospital Utilization Over the Past 12 Months: # of hospitalizations or ER visits: 1 # of surgeries: 1  Review of Systems    Patient reports that his overall health is unchanged compared to last year.    Patient Reported Readings (BP, Pulse, CBG, Weight, etc) none  Pain Assessment Pain : 0-10 Pain Score: 8  Pain Type: Chronic pain Pain Location:  Back Pain Radiating Towards: Bilateral Feet Pain Descriptors / Indicators: Aching,Burning Pain Frequency: Constant Pain Relieving Factors: gabapentin, hydrocodone  Pain Relieving Factors: gabapentin, hydrocodone  Current Medications & Allergies (verified) Allergies as of 10/07/2020      Reactions   Codeine    Abdominal pain   Penicillins Rash   Childhood reaction      Medication List       Accurate as of October 07, 2020 10:15 AM. If you have any questions, ask your nurse or doctor.        allopurinol 300 MG tablet Commonly known as: ZYLOPRIM TAKE ONE TABLET BY MOUTH DAILY   aspirin EC 81 MG tablet Take 81 mg by mouth daily.   carbidopa-levodopa 50-200 MG tablet Commonly known as: SINEMET CR Take by mouth.   cholecalciferol 25 MCG (1000 UNIT) tablet Commonly known as: VITAMIN D3 Take 1,000 Units by mouth daily.   furosemide 20 MG tablet Commonly known as: LASIX Take 20 mg by mouth 2 (two) times daily.   gabapentin 600 MG tablet Commonly known as: NEURONTIN Take 1 tablet (600 mg total) by mouth 3 (three) times daily. (NEEDS TO BE SEEN BEFORE NEXT REFILL)   HYDROcodone-acetaminophen 5-325 MG tablet Commonly known as: NORCO/VICODIN Take 1 tablet by mouth every 12 (twelve) hours as needed for moderate pain.   HYDROcodone-acetaminophen 5-325 MG tablet Commonly known as: Norco Take 1 tablet by mouth every 12 (twelve) hours as needed for moderate pain.   HYDROcodone-acetaminophen 5-325 MG tablet Commonly known as: Norco Take 1 tablet by mouth every 12 (twelve) hours as needed for moderate pain.   levothyroxine 112 MCG tablet Commonly known as: Synthroid Take 1 tablet (112 mcg total) by mouth daily.  loratadine 10 MG tablet Commonly known as: CLARITIN TAKE ONE TABLET BY MOUTH EVERY DAY   mometasone 50 MCG/ACT nasal spray Commonly known as: NASONEX INSTILL TWO SPRAYS INTO EACH NOSTRIL DAILY   montelukast 10 MG tablet Commonly known as: SINGULAIR Take 1  tablet (10 mg total) by mouth at bedtime. (NEEDS TO BE SEEN BEFORE NEXT REFILL)   OMEGA-3 FISH OIL PO Take 1 capsule by mouth daily.   simvastatin 20 MG tablet Commonly known as: ZOCOR Take 1 tablet (20 mg total) by mouth daily. (NEEDS TO BE SEEN BEFORE NEXT REFILL)       History (reviewed): Past Medical History:  Diagnosis Date  . Arthritis   . Asthma   . Back pain   . Family history of adverse reaction to anesthesia   . Gout   . Hyperlipidemia   . Hypertension   . Hypothyroidism   . Insomnia   . Joint pain   . Neuropathy   . Sleep apnea    not bad enought to do anyting about  . Thyroid disease    Past Surgical History:  Procedure Laterality Date  . COLONOSCOPY WITH PROPOFOL N/A 11/05/2019   Procedure: COLONOSCOPY WITH PROPOFOL;  Surgeon: Daneil Dolin, MD;  Location: [facility];  Service: Endoscopy;  Laterality: N/A;  12:30pm  . none    . POLYPECTOMY  11/05/2019   Procedure: POLYPECTOMY;  Surgeon: Daneil Dolin, MD;  Location: [facility];  Service: Endoscopy;;   Family History  Problem Relation Age of Onset  . Heart disease Mother   . Heart disease Father   . Hypertension Brother   . Hyperlipidemia Brother   . Heart disease Sister   . Heart disease Brother   . Heart disease Brother   . Cancer Brother   . Colon cancer Neg Hx   . Liver disease Neg Hx    Social History   Socioeconomic History  . Marital status: Divorced    Spouse name: Not on file  . Number of children: 3  . Years of education: 8   . Highest education level: 8th grade  Occupational History  . Occupation: Retired  Tobacco Use  . Smoking status: Former Smoker    Packs/day: 2.00    Years: 25.00    Pack years: 50.00    Types: Cigarettes    Quit date: 07/06/1995    Years since quitting: 25.2  . Smokeless tobacco: Current User    Types: Chew  Vaping Use  . Vaping Use: Never used  Substance and Sexual Activity  . Alcohol use: Not Currently    Comment: stopped 3 months ago  .  Drug use: No  . Sexual activity: Yes  Other Topics Concern  . Not on file  Social History Narrative  . Not on file   Social Determinants of Health   Financial Resource Strain: Not on file  Food Insecurity: Not on file  Transportation Needs: Not on file  Physical Activity: Not on file  Stress: Not on file  Social Connections: Not on file    Activities of Daily Living In your present state of health, do you have any difficulty performing the following activities: 10/07/2020 10/07/2020  Hearing? (No Data) Y  Comment Has an appt for hearing aids -  Vision? - N  Difficulty concentrating or making decisions? - N  Walking or climbing stairs? - N  Dressing or bathing? - N  Doing errands, shopping? - N  Conservation officer, nature and eating ? -  N  Using the Toilet? - N  In the past six months, have you accidently leaked urine? - N  Do you have problems with loss of bowel control? - N  Managing your Medications? - N  Housekeeping or managing your Housekeeping? - N  Some recent data might be hidden    Patient Education/ Literacy How often do you need to have someone help you when you read instructions, pamphlets, or other written materials from your doctor or pharmacy?: 2 - Rarely What is the last grade level you completed in school?: 8th grade  Exercise Current Exercise Habits: The patient does not participate in regular exercise at present  Diet Patient reports consuming 2 meals a day and 1 snack(s) a day Patient reports that his primary diet is: Regular Patient reports that she does have regular access to food.   Depression Screen PHQ 2/9 Scores 10/07/2020 09/30/2020 07/02/2020 04/01/2020 12/31/2019 10/05/2019 10/01/2019  PHQ - 2 Score 0 0 0 0 0 0 0     Fall Risk Fall Risk  10/07/2020 09/30/2020 07/02/2020 04/01/2020 12/31/2019  Falls in the past year? 0 0 0 0 0  Number falls in past yr: - 0 - - -  Injury with Fall? - 0 - - -  Risk for fall due to : - - - - -  Follow up - - - - -      Objective:  Andrew Phelps seemed alert and oriented and he participated appropriately during our telephone visit.  Blood Pressure Weight BMI  BP Readings from Last 3 Encounters:  09/30/20 128/69  07/02/20 137/74  04/01/20 (!) 141/83   Wt Readings from Last 3 Encounters:  09/30/20 271 lb 3.2 oz (123 kg)  07/02/20 269 lb 9.6 oz (122.3 kg)  04/01/20 261 lb (118.4 kg)   BMI Readings from Last 1 Encounters:  09/30/20 37.82 kg/m    *Unable to obtain current vital signs, weight, and BMI due to telephone visit type  Hearing/Vision  . Yannick did  seem to have difficulty with hearing/understanding during the telephone conversation . Reports that he has not had a formal eye exam by an eye care professional within the past year . Reports that he has not had a formal hearing evaluation within the past year *Unable to fully assess hearing and vision during telephone visit type  Cognitive Function: 6CIT Screen 10/07/2020 10/05/2019  What Year? 0 points 0 points  What month? 0 points 0 points  What time? 0 points 0 points  Count back from 20 4 points 0 points  Months in reverse 0 points 0 points  Repeat phrase 8 points 2 points  Total Score 12 2   (Normal:0-7, Significant for Dysfunction: >8)  Normal Cognitive Function Screening: No:    Immunization & Health Maintenance Record Immunization History  Administered Date(s) Administered  . Fluad Quad(high Dose 65+) 07/02/2019, 04/01/2020  . Influenza, High Dose Seasonal PF 06/06/2018  . Influenza,inj,Quad PF,6+ Mos 07/05/2013, 05/07/2015, 03/16/2017  . Moderna Sars-Covid-2 Vaccination 09/06/2019, 10/04/2019, 06/03/2020  . Pneumococcal Conjugate-13 07/05/2013, 12/11/2014  . Pneumococcal Polysaccharide-23 11/18/2016  . Tdap 10/11/2013  . Zoster Recombinat (Shingrix) 06/04/2020    Health Maintenance  Topic Date Due  . INFLUENZA VACCINE  01/19/2021  . TETANUS/TDAP  10/12/2023  . COLONOSCOPY (Pts 45-37yrs Insurance coverage  will need to be confirmed)  11/04/2029  . COVID-19 Vaccine  Completed  . Hepatitis C Screening  Completed  . PNA vac Low Risk Adult  Completed  . HPV VACCINES  Aged Out       Assessment  This is a routine wellness examination for [employer].  Health Maintenance: Due or Overdue There are no preventive care reminders to display for this patient.  Alanda Amass Pogorzelski does not need a referral for [employer] Assistance: Care Management:   no Social Work:    no Prescription Assistance:  no Nutrition/Diabetes Education:  no   Plan:  Personalized Goals Goals Addressed            This Visit's Progress   . Exercise 3x per week (30 min per time)   Not on track    10/05/2019 AWV Goal: Exercise for General Health   Patient will verbalize understanding of the benefits of increased physical activity:  Exercising regularly is important. It will improve your overall fitness, flexibility, and endurance.  Regular exercise also will improve your overall health. It can help you control your weight, reduce stress, and improve your bone density.  Over the next year, patient will increase physical activity as tolerated with a goal of at least 150 minutes of moderate physical activity per week.   You can tell that you are exercising at a moderate intensity if your heart starts beating faster and you start breathing faster but can still hold a conversation.  Moderate-intensity exercise ideas include:  Walking 1 mile (1.6 km) in about 15 minutes  Biking  Hiking  Golfing  Dancing  Water aerobics  Patient will verbalize understanding of everyday activities that increase physical activity by providing examples like the following: ? Yard work, such as: ? Pushing a Conservation officer, nature ? Raking and bagging leaves ? Washing your car ? Pushing a stroller ? Shoveling snow ? Gardening ? Washing windows or floors  Patient will be able to explain general safety guidelines for  exercising:   Before you start a new exercise program, talk with your health care provider.  Do not exercise so much that you hurt yourself, feel dizzy, or get very short of breath.  Wear comfortable clothes and wear shoes with good support.  Drink plenty of water while you exercise to prevent dehydration or heat stroke.  Work out until your breathing and your heartbeat get faster.     . Have 3 meals a day   No change    10/05/2019 AWV Goal: Improved Nutrition/Diet  . Patient will verbalize understanding that diet plays an important role in overall health and that a poor diet is a risk factor for many chronic medical conditions.  . Over the next year, patient will improve self management of their diet by incorporating better variety, improved meal pattern, decreased fat intake, more consistent meal timing, fewer sweetened foods & beverages, increased physical activity, better food choices, decreased sodium intake, will take vitamin/mineral supplement, choose non-carbonated beverages, adequate fluid intake (at least 6 cups of fluid per day), and watch portion sizes/amount of food eaten at one time. . Patient will utilize available community resources to help with food acquisition if needed (ex: food pantries, [address], etc) . Patient will work with nutrition specialist if a referral was made       Personalized Health Maintenance & Screening Recommendations    Lung Cancer Screening Recommended: no (Low Dose CT Chest recommended if Age 67-80 years, 30 pack-year currently smoking OR have quit w/in past 15 years) Hepatitis C Screening recommended: no HIV Screening recommended: no  Advanced Directives: Written information was not prepared per patient's request.  Referrals & Orders No orders of  the defined types were placed in this encounter.   Follow-up Plan . Follow-up with Sharion Balloon, FNP as planned . Schedule 01/01/21  I have personally reviewed and noted the following in  the patient's chart:   . Medical and social history . Use of alcohol, tobacco or illicit drugs  . Current medications and supplements . Functional ability and status . Nutritional status . Physical activity . Advanced directives . List of other physicians . Hospitalizations, surgeries, and ER visits in previous 12 months . Vitals . Screenings to include cognitive, depression, and falls . Referrals and appointments  In addition, I have reviewed and discussed with Andrew Phelps certain preventive protocols, quality metrics, and best practice recommendations. A written personalized care plan for preventive services as well as general preventive health recommendations is available and can be mailed to the patient at his request.      Maud Deed [facility]  [id number]

## 2020-10-07 NOTE — Patient Instructions (Signed)
  [facility] Maintenance Summary and Written Plan of Care  Andrew Phelps ,  Thank you for allowing me to perform your Medicare Annual Wellness Visit and for your ongoing commitment to your health.   Health Maintenance & Immunization History Health Maintenance  Topic Date Due  . INFLUENZA VACCINE  01/19/2021  . TETANUS/TDAP  10/12/2023  . COLONOSCOPY (Pts 45-71yrs Insurance coverage will need to be confirmed)  11/04/2029  . COVID-19 Vaccine  Completed  . Hepatitis C Screening  Completed  . PNA vac Low Risk Adult  Completed  . HPV VACCINES  Aged Out   Immunization History  Administered Date(s) Administered  . Fluad Quad(high Dose 65+) 07/02/2019, 04/01/2020  . Influenza, High Dose Seasonal PF 06/06/2018  . Influenza,inj,Quad PF,6+ Mos 07/05/2013, 05/07/2015, 03/16/2017  . Moderna Sars-Covid-2 Vaccination 09/06/2019, 10/04/2019, 06/03/2020  . Pneumococcal Conjugate-13 07/05/2013, 12/11/2014  . Pneumococcal Polysaccharide-23 11/18/2016  . Tdap 10/11/2013  . Zoster Recombinat (Shingrix) 06/04/2020    These are the patient goals that we discussed:  Eat three meals per day  Continue to walk everyday   Keep follow up for hearing aids    This is a list of Health Maintenance Items that are overdue or due now: There are no preventive care reminders to display for this patient.   Orders/Referrals Placed Today: No orders of the defined types were placed in this encounter.  (Contact our referral department at [phone] if you have not spoken with someone about your referral appointment within the next 5 days)    Follow-up Plan  Scheduled with Evelina Dun, FNP on 01/01/21 at 8:10am

## 2020-10-20 ENCOUNTER — Other Ambulatory Visit: Payer: Self-pay | Admitting: Family

## 2020-11-18 ENCOUNTER — Other Ambulatory Visit: Payer: Self-pay | Admitting: Family

## 2020-11-18 DIAGNOSIS — G6289 Other specified polyneuropathies: Secondary | ICD-10-CM

## 2020-12-16 ENCOUNTER — Other Ambulatory Visit: Payer: Self-pay | Admitting: Family

## 2020-12-19 DIAGNOSIS — E211 Secondary hyperparathyroidism, not elsewhere classified: Secondary | ICD-10-CM | POA: Diagnosis not present

## 2020-12-19 DIAGNOSIS — R600 Localized edema: Secondary | ICD-10-CM | POA: Diagnosis not present

## 2020-12-19 DIAGNOSIS — N1831 Chronic kidney disease, stage 3a: Secondary | ICD-10-CM | POA: Diagnosis not present

## 2020-12-19 DIAGNOSIS — E871 Hypo-osmolality and hyponatremia: Secondary | ICD-10-CM | POA: Diagnosis not present

## 2020-12-19 DIAGNOSIS — I129 Hypertensive chronic kidney disease with stage 1 through stage 4 chronic kidney disease, or unspecified chronic kidney disease: Secondary | ICD-10-CM | POA: Diagnosis not present

## 2020-12-25 DIAGNOSIS — R600 Localized edema: Secondary | ICD-10-CM | POA: Diagnosis not present

## 2020-12-25 DIAGNOSIS — I517 Cardiomegaly: Secondary | ICD-10-CM | POA: Diagnosis not present

## 2020-12-25 DIAGNOSIS — N1831 Chronic kidney disease, stage 3a: Secondary | ICD-10-CM | POA: Diagnosis not present

## 2020-12-25 DIAGNOSIS — E559 Vitamin D deficiency, unspecified: Secondary | ICD-10-CM | POA: Diagnosis not present

## 2020-12-25 DIAGNOSIS — E211 Secondary hyperparathyroidism, not elsewhere classified: Secondary | ICD-10-CM | POA: Diagnosis not present

## 2020-12-25 DIAGNOSIS — I129 Hypertensive chronic kidney disease with stage 1 through stage 4 chronic kidney disease, or unspecified chronic kidney disease: Secondary | ICD-10-CM | POA: Diagnosis not present

## 2021-01-01 ENCOUNTER — Ambulatory Visit (INDEPENDENT_AMBULATORY_CARE_PROVIDER_SITE_OTHER): Payer: Medicare Other | Admitting: Family

## 2021-01-01 ENCOUNTER — Encounter: Payer: Self-pay | Admitting: Family

## 2021-01-01 ENCOUNTER — Other Ambulatory Visit: Payer: Self-pay | Admitting: Family

## 2021-01-01 ENCOUNTER — Other Ambulatory Visit: Payer: Self-pay

## 2021-01-01 VITALS — BP 130/71 | HR 76 | Temp 98.4°F | Ht 71.0 in | Wt 278.6 lb

## 2021-01-01 DIAGNOSIS — F1120 Opioid dependence, uncomplicated: Secondary | ICD-10-CM | POA: Diagnosis not present

## 2021-01-01 DIAGNOSIS — Z23 Encounter for immunization: Secondary | ICD-10-CM | POA: Diagnosis not present

## 2021-01-01 DIAGNOSIS — I10 Essential (primary) hypertension: Secondary | ICD-10-CM

## 2021-01-01 DIAGNOSIS — E039 Hypothyroidism, unspecified: Secondary | ICD-10-CM | POA: Diagnosis not present

## 2021-01-01 DIAGNOSIS — J45909 Unspecified asthma, uncomplicated: Secondary | ICD-10-CM | POA: Diagnosis not present

## 2021-01-01 DIAGNOSIS — N1832 Chronic kidney disease, stage 3b: Secondary | ICD-10-CM

## 2021-01-01 DIAGNOSIS — M545 Low back pain, unspecified: Secondary | ICD-10-CM | POA: Diagnosis not present

## 2021-01-01 DIAGNOSIS — E669 Obesity, unspecified: Secondary | ICD-10-CM

## 2021-01-01 DIAGNOSIS — G8929 Other chronic pain: Secondary | ICD-10-CM

## 2021-01-01 DIAGNOSIS — M1990 Unspecified osteoarthritis, unspecified site: Secondary | ICD-10-CM

## 2021-01-01 DIAGNOSIS — Z0289 Encounter for other administrative examinations: Secondary | ICD-10-CM

## 2021-01-01 MED ORDER — LEVOTHYROXINE SODIUM 112 MCG PO TABS: 112.0000 ug | ORAL_TABLET | Freq: Every day | ORAL | 11 refills | Status: DC

## 2021-01-01 MED ORDER — HYDROCODONE-ACETAMINOPHEN 5-325 MG PO TABS: 1.0000 | ORAL_TABLET | Freq: Two times a day (BID) | ORAL | 0 refills | Status: DC | PRN

## 2021-01-01 MED ORDER — MONTELUKAST SODIUM 10 MG PO TABS: 10.0000 mg | ORAL_TABLET | Freq: Every day | ORAL | 2 refills | Status: DC

## 2021-01-01 NOTE — Patient Instructions (Signed)
Peripheral Edema Peripheral edema is swelling that is caused by a buildup of fluid. Peripheral edema most often affects the lower legs, ankles, and feet. It can also develop in the arms, hands, and face. The area of the body that has peripheral edema will look swollen. It may also feel heavy or warm. Your clothes may start to feel tight. Pressing on the area may make a temporary dent in your skin. You may not be able to move your swollen arm or leg as much as usual. There are many causes of peripheral edema. It can happen because of a complication of other conditions such as congestive heart failure, kidney disease, or a problem with your blood circulation. It also can be a side effect of certain medicines or because of an infection. It often happens to women during pregnancy. Sometimes, the cause is not known. Follow these instructions at home: Managing pain, stiffness, and swelling  Raise (elevate) your legs while you are sitting or lying down. Move around often to prevent stiffness and to lessen swelling. Do not sit or stand for long periods of time. Wear support stockings as told by your health care provider. Medicines Take over-the-counter and prescription medicines only as told by your health care provider. Your health care provider may prescribe medicine to help your body get rid of excess water (diuretic). General instructions Pay attention to any changes in your symptoms. Follow instructions from your health care provider about limiting salt (sodium) in your diet. Sometimes, eating less salt may reduce swelling. Moisturize skin daily to help prevent skin from cracking and draining. Keep all follow-up visits as told by your health care provider. This is important. Contact a health care provider if you have: A fever. Edema that starts suddenly or is getting worse, especially if you are pregnant or have a medical condition. Swelling in only one leg. Increased swelling, redness, or pain in  one or both of your legs. Drainage or sores at the area where you have edema. Get help right away if you: Develop shortness of breath, especially when you are lying down. Have pain in your chest or abdomen. Feel weak. Feel faint. Summary Peripheral edema is swelling that is caused by a buildup of fluid. Peripheral edema most often affects the lower legs, ankles, and feet. Move around often to prevent stiffness and to lessen swelling. Do not sit or stand for long periods of time. Pay attention to any changes in your symptoms. Contact a health care provider if you have edema that starts suddenly or is getting worse, especially if you are pregnant or have a medical condition. Get help right away if you develop shortness of breath, especially when lying down. This information is not intended to replace advice given to you by your health care provider. Make sure you discuss any questions you have with your health care provider. Document Revised: 03/01/2018 Document Reviewed: 03/01/2018 Elsevier Patient Education  2022 [employer]  

## 2021-01-01 NOTE — Progress Notes (Signed)
Subjective:    Patient ID: Andrew Phelps, male    DOB: [birth year], 75 y.o.   MRN: [id number]  Chief Complaint  Patient presents with   Medical Management of Chronic Issues   PT presents the office today for chronic follow up and pain medication refill. PT is followed by Nephrologists every 3 months.  He saw them on 12/25/20 and his Lasix was increased to 40 mg in AM and 20 mg PM from 20  mg BID. He states he has since lost 6 lbs. States he is feeling much better.  Hypertension This is a chronic problem. The current episode started more than 1 year ago. The problem has been resolved since onset. The problem is controlled. Associated symptoms include peripheral edema. Pertinent negatives include no malaise/fatigue or shortness of breath. Risk factors for coronary artery disease include dyslipidemia, obesity and male gender. The current treatment provides moderate improvement. Hypertensive end-organ damage includes heart failure. Identifiable causes of hypertension include a thyroid problem.  Asthma There is no cough, shortness of breath or wheezing. Pertinent negatives include no malaise/fatigue. His past medical history is significant for asthma.  Thyroid Problem Presents for follow-up visit. Patient reports no depressed mood, diarrhea, dry skin or fatigue. The symptoms have been stable. His past medical history is significant for heart failure.  Arthritis Presents for follow-up visit. He complains of pain and stiffness. The symptoms have been stable. Affected locations include the left knee, right knee, right MCP and left MCP. His pain is at a severity of 8/10. Pertinent negatives include no diarrhea or fatigue.  Back Pain This is a chronic problem. The current episode started more than 1 year ago. The problem occurs intermittently. The problem has been waxing and waning since onset. The pain is present in the lumbar spine. The pain is at a severity of 8/10. The pain is moderate.      Review of Systems  Constitutional:  Negative for fatigue and malaise/fatigue.  Respiratory:  Negative for cough, shortness of breath and wheezing.   Gastrointestinal:  Negative for diarrhea.  Musculoskeletal:  Positive for arthritis, back pain and stiffness.  All other systems reviewed and are negative.     Objective:   Physical Exam Vitals reviewed.  Constitutional:      General: He is not in acute distress.    Appearance: He is well-developed.  HENT:     Head: Normocephalic.     Right Ear: Tympanic membrane normal.     Left Ear: Tympanic membrane normal.  Eyes:     General:        Right eye: No discharge.        Left eye: No discharge.     Pupils: Pupils are equal, round, and reactive to light.  Neck:     Thyroid: No thyromegaly.  Cardiovascular:     Rate and Rhythm: Normal rate and regular rhythm.     Heart sounds: Normal heart sounds. No murmur heard. Pulmonary:     Effort: Pulmonary effort is normal. No respiratory distress.     Breath sounds: Normal breath sounds. No wheezing.  Abdominal:     General: Bowel sounds are normal. There is no distension.     Palpations: Abdomen is soft.     Tenderness: There is no abdominal tenderness.  Musculoskeletal:        General: No tenderness. Normal range of motion.     Cervical back: Normal range of motion and neck supple.     Right lower  leg: Edema (3+) present.     Left lower leg: Edema (3+) present.  Skin:    General: Skin is warm and dry.     Findings: No erythema or rash.  Neurological:     Mental Status: He is alert and oriented to person, place, and time.     Cranial Nerves: No cranial nerve deficit.     Deep Tendon Reflexes: Reflexes are normal and symmetric.  Psychiatric:        Behavior: Behavior normal.        Thought Content: Thought content normal.        Judgment: Judgment normal.      BP 130/71   Pulse 76   Temp 98.4 F (36.9 C) (Temporal)   Ht 5\' 11"  (1.803 m)   Wt 278 lb 9.6 oz (126.4 kg)    SpO2 97%   BMI 38.86 kg/m      Assessment & Plan:  Andrew Phelps comes in today with chief complaint of Medical Management of Chronic Issues   Diagnosis and orders addressed:  1. Arthritis - HYDROcodone-acetaminophen (NORCO/VICODIN) 5-325 MG tablet; Take 1 tablet by mouth every 12 (twelve) hours as needed for moderate pain.  Dispense: 60 tablet; Refill: 0 - HYDROcodone-acetaminophen (NORCO) 5-325 MG tablet; Take 1 tablet by mouth every 12 (twelve) hours as needed for moderate pain.  Dispense: 60 tablet; Refill: 0 - HYDROcodone-acetaminophen (NORCO) 5-325 MG tablet; Take 1 tablet by mouth every 12 (twelve) hours as needed for moderate pain.  Dispense: 60 tablet; Refill: 0  2. Pain medication agreement signed - HYDROcodone-acetaminophen (NORCO/VICODIN) 5-325 MG tablet; Take 1 tablet by mouth every 12 (twelve) hours as needed for moderate pain.  Dispense: 60 tablet; Refill: 0 - HYDROcodone-acetaminophen (NORCO) 5-325 MG tablet; Take 1 tablet by mouth every 12 (twelve) hours as needed for moderate pain.  Dispense: 60 tablet; Refill: 0 - HYDROcodone-acetaminophen (NORCO) 5-325 MG tablet; Take 1 tablet by mouth every 12 (twelve) hours as needed for moderate pain.  Dispense: 60 tablet; Refill: 0  3. Uncomplicated opioid dependence (HCC) - HYDROcodone-acetaminophen (NORCO/VICODIN) 5-325 MG tablet; Take 1 tablet by mouth every 12 (twelve) hours as needed for moderate pain.  Dispense: 60 tablet; Refill: 0 - HYDROcodone-acetaminophen (NORCO) 5-325 MG tablet; Take 1 tablet by mouth every 12 (twelve) hours as needed for moderate pain.  Dispense: 60 tablet; Refill: 0 - HYDROcodone-acetaminophen (NORCO) 5-325 MG tablet; Take 1 tablet by mouth every 12 (twelve) hours as needed for moderate pain.  Dispense: 60 tablet; Refill: 0  4. Chronic bilateral low back pain without sciatica - HYDROcodone-acetaminophen (NORCO/VICODIN) 5-325 MG tablet; Take 1 tablet by mouth every 12 (twelve) hours as  needed for moderate pain.  Dispense: 60 tablet; Refill: 0 - HYDROcodone-acetaminophen (NORCO) 5-325 MG tablet; Take 1 tablet by mouth every 12 (twelve) hours as needed for moderate pain.  Dispense: 60 tablet; Refill: 0 - HYDROcodone-acetaminophen (NORCO) 5-325 MG tablet; Take 1 tablet by mouth every 12 (twelve) hours as needed for moderate pain.  Dispense: 60 tablet; Refill: 0  5. Essential hypertension  6. Chronic asthma without complication, unspecified asthma severity, unspecified whether persistent - montelukast (SINGULAIR) 10 MG tablet; Take 1 tablet (10 mg total) by mouth at bedtime. (NEEDS TO BE SEEN BEFORE NEXT REFILL)  Dispense: 30 tablet; Refill: 2  7. Hypothyroidism, unspecified type - levothyroxine (SYNTHROID) 112 MCG tablet; Take 1 tablet (112 mcg total) by mouth daily.  Dispense: 30 tablet; Refill: 11  8. Stage 3b chronic kidney disease ([place])  9. Obesity (BMI 30-39.9)  10. Need for shingles vaccine  - Varicella-zoster vaccine IM (Shingrix)   Labs pending Patient reviewed in [place] controlled database, no flags noted. Contract and drug screen are up to date.  Health Maintenance reviewed Diet and exercise encouraged  Follow up plan: 3 months    Evelina Dun, FNP

## 2021-01-12 ENCOUNTER — Other Ambulatory Visit: Payer: Self-pay | Admitting: Family

## 2021-01-12 DIAGNOSIS — G6289 Other specified polyneuropathies: Secondary | ICD-10-CM

## 2021-01-16 DIAGNOSIS — E211 Secondary hyperparathyroidism, not elsewhere classified: Secondary | ICD-10-CM | POA: Diagnosis not present

## 2021-01-16 DIAGNOSIS — N1831 Chronic kidney disease, stage 3a: Secondary | ICD-10-CM | POA: Diagnosis not present

## 2021-01-16 DIAGNOSIS — R600 Localized edema: Secondary | ICD-10-CM | POA: Diagnosis not present

## 2021-01-16 DIAGNOSIS — I129 Hypertensive chronic kidney disease with stage 1 through stage 4 chronic kidney disease, or unspecified chronic kidney disease: Secondary | ICD-10-CM | POA: Diagnosis not present

## 2021-01-23 DIAGNOSIS — N1831 Chronic kidney disease, stage 3a: Secondary | ICD-10-CM | POA: Diagnosis not present

## 2021-01-23 DIAGNOSIS — R600 Localized edema: Secondary | ICD-10-CM | POA: Diagnosis not present

## 2021-01-23 DIAGNOSIS — I129 Hypertensive chronic kidney disease with stage 1 through stage 4 chronic kidney disease, or unspecified chronic kidney disease: Secondary | ICD-10-CM | POA: Diagnosis not present

## 2021-01-23 DIAGNOSIS — E211 Secondary hyperparathyroidism, not elsewhere classified: Secondary | ICD-10-CM | POA: Diagnosis not present

## 2021-02-06 DIAGNOSIS — I129 Hypertensive chronic kidney disease with stage 1 through stage 4 chronic kidney disease, or unspecified chronic kidney disease: Secondary | ICD-10-CM | POA: Diagnosis not present

## 2021-02-06 DIAGNOSIS — R600 Localized edema: Secondary | ICD-10-CM | POA: Diagnosis not present

## 2021-02-06 DIAGNOSIS — E211 Secondary hyperparathyroidism, not elsewhere classified: Secondary | ICD-10-CM | POA: Diagnosis not present

## 2021-02-06 DIAGNOSIS — N1831 Chronic kidney disease, stage 3a: Secondary | ICD-10-CM | POA: Diagnosis not present

## 2021-02-12 ENCOUNTER — Other Ambulatory Visit: Payer: Self-pay | Admitting: Family

## 2021-02-12 DIAGNOSIS — J302 Other seasonal allergic rhinitis: Secondary | ICD-10-CM

## 2021-02-12 DIAGNOSIS — E039 Hypothyroidism, unspecified: Secondary | ICD-10-CM

## 2021-03-05 DIAGNOSIS — R600 Localized edema: Secondary | ICD-10-CM | POA: Diagnosis not present

## 2021-03-05 DIAGNOSIS — N1831 Chronic kidney disease, stage 3a: Secondary | ICD-10-CM | POA: Diagnosis not present

## 2021-03-05 DIAGNOSIS — I129 Hypertensive chronic kidney disease with stage 1 through stage 4 chronic kidney disease, or unspecified chronic kidney disease: Secondary | ICD-10-CM | POA: Diagnosis not present

## 2021-03-05 DIAGNOSIS — E211 Secondary hyperparathyroidism, not elsewhere classified: Secondary | ICD-10-CM | POA: Diagnosis not present

## 2021-03-13 DIAGNOSIS — E211 Secondary hyperparathyroidism, not elsewhere classified: Secondary | ICD-10-CM | POA: Diagnosis not present

## 2021-03-13 DIAGNOSIS — I129 Hypertensive chronic kidney disease with stage 1 through stage 4 chronic kidney disease, or unspecified chronic kidney disease: Secondary | ICD-10-CM | POA: Diagnosis not present

## 2021-03-13 DIAGNOSIS — N1831 Chronic kidney disease, stage 3a: Secondary | ICD-10-CM | POA: Diagnosis not present

## 2021-03-13 DIAGNOSIS — R600 Localized edema: Secondary | ICD-10-CM | POA: Diagnosis not present

## 2021-03-16 ENCOUNTER — Other Ambulatory Visit: Payer: Self-pay | Admitting: Family

## 2021-03-16 DIAGNOSIS — G6289 Other specified polyneuropathies: Secondary | ICD-10-CM

## 2021-04-07 ENCOUNTER — Other Ambulatory Visit: Payer: Self-pay

## 2021-04-07 ENCOUNTER — Encounter: Payer: Self-pay | Admitting: Family

## 2021-04-07 ENCOUNTER — Ambulatory Visit (INDEPENDENT_AMBULATORY_CARE_PROVIDER_SITE_OTHER): Payer: Medicare Other | Admitting: Family

## 2021-04-07 VITALS — BP 142/76 | HR 68 | Temp 97.0°F | Ht 71.0 in | Wt 283.4 lb

## 2021-04-07 DIAGNOSIS — G6289 Other specified polyneuropathies: Secondary | ICD-10-CM | POA: Diagnosis not present

## 2021-04-07 DIAGNOSIS — J45909 Unspecified asthma, uncomplicated: Secondary | ICD-10-CM

## 2021-04-07 DIAGNOSIS — F1120 Opioid dependence, uncomplicated: Secondary | ICD-10-CM

## 2021-04-07 DIAGNOSIS — N1832 Chronic kidney disease, stage 3b: Secondary | ICD-10-CM | POA: Diagnosis not present

## 2021-04-07 DIAGNOSIS — G8929 Other chronic pain: Secondary | ICD-10-CM

## 2021-04-07 DIAGNOSIS — E039 Hypothyroidism, unspecified: Secondary | ICD-10-CM | POA: Diagnosis not present

## 2021-04-07 DIAGNOSIS — Z0289 Encounter for other administrative examinations: Secondary | ICD-10-CM | POA: Diagnosis not present

## 2021-04-07 DIAGNOSIS — E785 Hyperlipidemia, unspecified: Secondary | ICD-10-CM

## 2021-04-07 DIAGNOSIS — G4700 Insomnia, unspecified: Secondary | ICD-10-CM

## 2021-04-07 DIAGNOSIS — I10 Essential (primary) hypertension: Secondary | ICD-10-CM | POA: Diagnosis not present

## 2021-04-07 DIAGNOSIS — Z23 Encounter for immunization: Secondary | ICD-10-CM | POA: Diagnosis not present

## 2021-04-07 DIAGNOSIS — M1990 Unspecified osteoarthritis, unspecified site: Secondary | ICD-10-CM | POA: Diagnosis not present

## 2021-04-07 DIAGNOSIS — M545 Low back pain, unspecified: Secondary | ICD-10-CM

## 2021-04-07 MED ORDER — HYDROCODONE-ACETAMINOPHEN 5-325 MG PO TABS: 1.0000 | ORAL_TABLET | Freq: Two times a day (BID) | ORAL | 0 refills | Status: DC | PRN

## 2021-04-07 MED ORDER — LEVOTHYROXINE SODIUM 112 MCG PO TABS: 112.0000 ug | ORAL_TABLET | Freq: Every day | ORAL | 3 refills | Status: DC

## 2021-04-07 MED ORDER — MONTELUKAST SODIUM 10 MG PO TABS: 10.0000 mg | ORAL_TABLET | Freq: Every day | ORAL | 2 refills | Status: DC

## 2021-04-07 NOTE — Progress Notes (Signed)
Subjective:    Patient ID: Andrew Phelps, male    DOB: 11/22/1945, 75 y.o.   MRN: [id number]  Chief Complaint  Patient presents with   Medical Management of Chronic Issues    Edema    PT presents the office today for chronic follow up and pain medication refill. PT is followed by Nephrologists every 3 months for CKD.  He has peripheral edema.  He is considered morbid obese with a BMI of 39 and co morbidity of HTN and CKD.   Hypertension This is a chronic problem. The current episode started more than 1 year ago. The problem has been waxing and waning since onset. The problem is controlled. Associated symptoms include peripheral edema. Pertinent negatives include no malaise/fatigue or shortness of breath. Risk factors for coronary artery disease include dyslipidemia, obesity and male gender. The current treatment provides moderate improvement. Hypertensive end-organ damage includes kidney disease. There is no history of CAD/MI, CVA or heart failure. Identifiable causes of hypertension include a thyroid problem.  Asthma There is no cough, hoarse voice, shortness of breath or wheezing. This is a chronic problem. The current episode started more than 1 year ago. The problem occurs intermittently. Pertinent negatives include no malaise/fatigue. His past medical history is significant for asthma.  Thyroid Problem Presents for follow-up visit. Symptoms include fatigue. Patient reports no constipation or hoarse voice. The symptoms have been stable. His past medical history is significant for hyperlipidemia. There is no history of heart failure.  Arthritis Presents for follow-up visit. He complains of pain and stiffness. The symptoms have been worsening. Affected locations include the left knee, right knee, left MCP, right MCP, left foot and right foot. His pain is at a severity of 9/10. Associated symptoms include fatigue.  Insomnia Primary symptoms: difficulty falling asleep, frequent  awakening, no malaise/fatigue.   The current episode started more than one year. The onset quality is gradual. The problem occurs intermittently.  Hyperlipidemia This is a chronic problem. The current episode started more than 1 year ago. Exacerbating diseases include obesity. Pertinent negatives include no shortness of breath. Current antihyperlipidemic treatment includes statins. The current treatment provides moderate improvement of lipids.  Back Pain This is a chronic problem. The current episode started more than 1 year ago. The problem occurs intermittently. The quality of the pain is described as aching.  Anemia There has been no malaise/fatigue. There is no history of heart failure.     Review of Systems  Constitutional:  Positive for fatigue. Negative for malaise/fatigue.  HENT:  Negative for hoarse voice.   Respiratory:  Negative for cough, shortness of breath and wheezing.   Gastrointestinal:  Negative for constipation.  Musculoskeletal:  Positive for arthritis, back pain and stiffness.  Psychiatric/Behavioral:  The patient has insomnia.   All other systems reviewed and are negative.     Objective:   Physical Exam Vitals reviewed.  Constitutional:      General: He is not in acute distress.    Appearance: He is well-developed. He is obese.  HENT:     Head: Normocephalic.     Right Ear: Tympanic membrane normal.     Left Ear: Tympanic membrane normal.  Eyes:     General:        Right eye: No discharge.        Left eye: No discharge.     Pupils: Pupils are equal, round, and reactive to light.  Neck:     Thyroid: No thyromegaly.  Cardiovascular:  Rate and Rhythm: Normal rate and regular rhythm.     Heart sounds: Normal heart sounds. No murmur heard. Pulmonary:     Effort: Pulmonary effort is normal. No respiratory distress.     Breath sounds: Normal breath sounds. No wheezing.  Abdominal:     General: Bowel sounds are normal. There is no distension.      Palpations: Abdomen is soft.     Tenderness: There is no abdominal tenderness.  Musculoskeletal:        General: No tenderness. Normal range of motion.     Cervical back: Normal range of motion and neck supple.     Right lower leg: Edema (2+) present.     Left lower leg: Edema (2+) present.  Skin:    General: Skin is warm and dry.     Findings: No erythema or rash.  Neurological:     Mental Status: He is alert and oriented to person, place, and time.     Cranial Nerves: No cranial nerve deficit.     Deep Tendon Reflexes: Reflexes are normal and symmetric.  Psychiatric:        Behavior: Behavior normal.        Thought Content: Thought content normal.        Judgment: Judgment normal.      BP (!) 142/76   Pulse 68   Temp (!) 97 F (36.1 C) (Temporal)   Ht _0  (1.803 m)   Wt 283 lb 6.4 oz (128.5 kg)   BMI 39.53 kg/m      Assessment & Plan:  Hodari Chuba comes in today with chief complaint of Medical Management of Chronic Issues (Edema )   Diagnosis and orders addressed:  1. Arthritis - HYDROcodone-acetaminophen (NORCO/VICODIN) 5-325 MG tablet; Take 1 tablet by mouth every 12 (twelve) hours as needed for moderate pain.  Dispense: 60 tablet; Refill: 0 - HYDROcodone-acetaminophen (NORCO) 5-325 MG tablet; Take 1 tablet by mouth every 12 (twelve) hours as needed for moderate pain.  Dispense: 60 tablet; Refill: 0 - HYDROcodone-acetaminophen (NORCO) 5-325 MG tablet; Take 1 tablet by mouth every 12 (twelve) hours as needed for moderate pain.  Dispense: 60 tablet; Refill: 0 - CMP14+EGFR - CBC with Differential/Platelet  2. Pain medication agreement signed - HYDROcodone-acetaminophen (NORCO/VICODIN) 5-325 MG tablet; Take 1 tablet by mouth every 12 (twelve) hours as needed for moderate pain.  Dispense: 60 tablet; Refill: 0 - HYDROcodone-acetaminophen (NORCO) 5-325 MG tablet; Take 1 tablet by mouth every 12 (twelve) hours as needed for moderate pain.  Dispense: 60  tablet; Refill: 0 - HYDROcodone-acetaminophen (NORCO) 5-325 MG tablet; Take 1 tablet by mouth every 12 (twelve) hours as needed for moderate pain.  Dispense: 60 tablet; Refill: 0 - CMP14+EGFR - CBC with Differential/Platelet  3. Uncomplicated opioid dependence (HCC) - HYDROcodone-acetaminophen (NORCO/VICODIN) 5-325 MG tablet; Take 1 tablet by mouth every 12 (twelve) hours as needed for moderate pain.  Dispense: 60 tablet; Refill: 0 - HYDROcodone-acetaminophen (NORCO) 5-325 MG tablet; Take 1 tablet by mouth every 12 (twelve) hours as needed for moderate pain.  Dispense: 60 tablet; Refill: 0 - HYDROcodone-acetaminophen (NORCO) 5-325 MG tablet; Take 1 tablet by mouth every 12 (twelve) hours as needed for moderate pain.  Dispense: 60 tablet; Refill: 0 - CMP14+EGFR - CBC with Differential/Platelet  4. Chronic bilateral low back pain without sciatica - HYDROcodone-acetaminophen (NORCO/VICODIN) 5-325 MG tablet; Take 1 tablet by mouth every 12 (twelve) hours as needed for moderate pain.  Dispense: 60 tablet; Refill: 0 - HYDROcodone-acetaminophen (  NORCO) 5-325 MG tablet; Take 1 tablet by mouth every 12 (twelve) hours as needed for moderate pain.  Dispense: 60 tablet; Refill: 0 - HYDROcodone-acetaminophen (NORCO) 5-325 MG tablet; Take 1 tablet by mouth every 12 (twelve) hours as needed for moderate pain.  Dispense: 60 tablet; Refill: 0 - CMP14+EGFR - CBC with Differential/Platelet  5. Hypothyroidism, unspecified type - levothyroxine (SYNTHROID) 112 MCG tablet; Take 1 tablet (112 mcg total) by mouth daily.  Dispense: 30 tablet; Refill: 3 - CMP14+EGFR - CBC with Differential/Platelet - TSH  6. Chronic asthma without complication, unspecified asthma severity, unspecified whether persistent - montelukast (SINGULAIR) 10 MG tablet; Take 1 tablet (10 mg total) by mouth at bedtime. (NEEDS TO BE SEEN BEFORE NEXT REFILL)  Dispense: 30 tablet; Refill: 2 - CMP14+EGFR - CBC with Differential/Platelet  7. Need  for immunization against influenza - Flu Vaccine QUAD High Dose(Fluad) - CMP14+EGFR - CBC with Differential/Platelet  8. Essential hypertension - CMP14+EGFR - CBC with Differential/Platelet  9. Other polyneuropathy - CMP14+EGFR - CBC with Differential/Platelet  10. Stage 3b chronic kidney disease (HCC) - CMP14+EGFR - CBC with Differential/Platelet  11. Hyperlipidemia, unspecified hyperlipidemia type - CMP14+EGFR - CBC with Differential/Platelet - Lipid panel  12. Morbid obesity ([place]) - CMP14+EGFR - CBC with Differential/Platelet  13. Insomnia, unspecified type - CMP14+EGFR - CBC with Differential/Platelet   Labs pending Patient reviewed in [place] controlled database, no flags noted. Contract and drug screen are up to date.  Health Maintenance reviewed Diet and exercise encouraged  Follow up plan: 3 months    Evelina Dun, FNP

## 2021-04-07 NOTE — Patient Instructions (Signed)
Peripheral Neuropathy ?Peripheral neuropathy is a type of nerve damage. It affects nerves that carry signals between the spinal cord and the arms, legs, and the rest of the body (peripheral nerves). It does not affect nerves in the spinal cord or brain. In peripheral neuropathy, one nerve or a group of nerves may be damaged. Peripheral neuropathy is a broad category that includes many specific nerve disorders, like diabetic neuropathy, hereditary neuropathy, and carpal tunnel syndrome. ?What are the causes? ?This condition may be caused by: ?Diabetes. This is the most common cause of peripheral neuropathy. ?Nerve injury. ?Pressure or stress on a nerve that lasts a long time. ?Lack (deficiency) of B vitamins. This can result from alcoholism, poor diet, or a restricted diet. ?Infections. ?Autoimmune diseases, such as rheumatoid arthritis and systemic lupus erythematosus. ?Nerve diseases that are passed from parent to child (inherited). ?Some medicines, such as cancer medicines (chemotherapy). ?Poisonous (toxic) substances, such as lead and mercury. ?Too little blood flowing to the legs. ?Kidney disease. ?Thyroid disease. ?In some cases, the cause of this condition is not known. ?What are the signs or symptoms? ?Symptoms of this condition depend on which of your nerves is damaged. Common symptoms include: ?Loss of feeling (numbness) in the feet, hands, or both. ?Tingling in the feet, hands, or both. ?Burning pain. ?Very sensitive skin. ?Weakness. ?Not being able to move a part of the body (paralysis). ?Muscle twitching. ?Clumsiness or poor coordination. ?Loss of balance. ?Not being able to control your bladder. ?Feeling dizzy. ?Sexual problems. ?How is this diagnosed? ?Diagnosing and finding the cause of peripheral neuropathy can be difficult. Your health care provider will take your medical history and do a physical exam. A neurological exam will also be done. This involves checking things that are affected by your  brain, spinal cord, and nerves (nervous system). For example, your health care provider will check your reflexes, how you move, and what you can feel. ?You may have other tests, such as: ?Blood tests. ?Electromyogram (EMG) and nerve conduction tests. These tests check nerve function and how well the nerves are controlling the muscles. ?Imaging tests, such as CT scans or MRI to rule out other causes of your symptoms. ?Removing a small piece of nerve to be examined in a lab (nerve biopsy). ?Removing and examining a small amount of the fluid that surrounds the brain and spinal cord (lumbar puncture). ?How is this treated? ?Treatment for this condition may involve: ?Treating the underlying cause of the neuropathy, such as diabetes, kidney disease, or vitamin deficiencies. ?Stopping medicines that can cause neuropathy, such as chemotherapy. ?Medicine to help relieve pain. Medicines may include: ?Prescription or over-the-counter pain medicine. ?Antiseizure medicine. ?Antidepressants. ?Pain-relieving patches that are applied to painful areas of skin. ?Surgery to relieve pressure on a nerve or to destroy a nerve that is causing pain. ?Physical therapy to help improve movement and balance. ?Devices to help you move around (assistive devices). ?Follow these instructions at home: ?Medicines ?Take over-the-counter and prescription medicines only as told by your health care provider. Do not take any other medicines without first asking your health care provider. ?Do not drive or use heavy machinery while taking prescription pain medicine. ?Lifestyle ? ?Do not use any products that contain nicotine or tobacco, such as cigarettes and e-cigarettes. Smoking keeps blood from reaching damaged nerves. If you need help quitting, ask your health care provider. ?Avoid or limit alcohol. Too much alcohol can cause a vitamin B deficiency, and vitamin B is needed for healthy nerves. ?Eat a   healthy diet. This includes: ?Eating foods that are  high in fiber, such as fresh fruits and vegetables, whole grains, and beans. ?Limiting foods that are high in fat and processed sugars, such as fried or sweet foods. ?General instructions ? ?If you have diabetes, work closely with your health care provider to keep your blood sugar under control. ?If you have numbness in your feet: ?Check every day for signs of injury or infection. Watch for redness, warmth, and swelling. ?Wear padded socks and comfortable shoes. These help protect your feet. ?Develop a good support system. Living with peripheral neuropathy can be stressful. Consider talking with a mental health specialist or joining a support group. ?Use assistive devices and attend physical therapy as told by your health care provider. This may include using a walker or a cane. ?Keep all follow-up visits as told by your health care provider. This is important. ?Contact a health care provider if: ?You have new signs or symptoms of peripheral neuropathy. ?You are struggling emotionally from dealing with peripheral neuropathy. ?Your pain is not well-controlled. ?Get help right away if: ?You have an injury or infection that is not healing normally. ?You develop new weakness in an arm or leg. ?You have fallen or do so frequently. ?Summary ?Peripheral neuropathy is when the nerves in the arms, or legs are damaged, resulting in numbness, weakness, or pain. ?There are many causes of peripheral neuropathy, including diabetes, pinched nerves, vitamin deficiencies, autoimmune disease, and hereditary conditions. ?Diagnosing and finding the cause of peripheral neuropathy can be difficult. Your health care provider will take your medical history, do a physical exam, and do tests, including blood tests and nerve function tests. ?Treatment involves treating the underlying cause of the neuropathy and taking medicines to help control pain. Physical therapy and assistive devices may also help. ?This information is not intended to  replace advice given to you by your health care provider. Make sure you discuss any questions you have with your health care provider. ?Document Revised: 03/18/2020 Document Reviewed: 03/18/2020 ?[employer]vier Patient Education ? 2022 [employer] ? ?

## 2021-04-08 LAB — CBC WITH DIFFERENTIAL/PLATELET
Basophils Absolute: 0.1 10*3/uL (ref 0.0–0.2)
Basos: 1 %
EOS (ABSOLUTE): 0.4 10*3/uL (ref 0.0–0.4)
Eos: 3 %
Hematocrit: 43.2 % (ref 37.5–51.0)
Hemoglobin: 14.6 g/dL (ref 13.0–17.7)
Immature Grans (Abs): 0.1 10*3/uL (ref 0.0–0.1)
Immature Granulocytes: 1 %
Lymphocytes Absolute: 2.3 10*3/uL (ref 0.7–3.1)
Lymphs: 22 %
MCH: 30.6 pg (ref 26.6–33.0)
MCHC: 33.8 g/dL (ref 31.5–35.7)
MCV: 91 fL (ref 79–97)
Monocytes Absolute: 0.7 10*3/uL (ref 0.1–0.9)
Monocytes: 6 %
Neutrophils Absolute: 7.2 10*3/uL — ABNORMAL HIGH (ref 1.4–7.0)
Neutrophils: 67 %
Platelets: 202 10*3/uL (ref 150–450)
RBC: 4.77 x10E6/uL (ref 4.14–5.80)
RDW: 13.8 % (ref 11.6–15.4)
WBC: 10.7 10*3/uL (ref 3.4–10.8)

## 2021-04-08 LAB — LIPID PANEL
Chol/HDL Ratio: 3.5 ratio (ref 0.0–5.0)
Cholesterol, Total: 128 mg/dL (ref 100–199)
HDL: 37 mg/dL — ABNORMAL LOW (ref >39)
LDL Chol Calc (NIH): 71 mg/dL (ref 0–99)
Triglycerides: 106 mg/dL (ref 0–149)
VLDL Cholesterol Cal: 20 mg/dL (ref 5–40)

## 2021-04-08 LAB — CMP14+EGFR
ALT: 15 IU/L (ref 0–44)
AST: 20 IU/L (ref 0–40)
Albumin/Globulin Ratio: 1.3 (ref 1.2–2.2)
Albumin: 4.1 g/dL (ref 3.7–4.7)
Alkaline Phosphatase: 107 IU/L (ref 44–121)
BUN/Creatinine Ratio: 8 — ABNORMAL LOW (ref 10–24)
BUN: 14 mg/dL (ref 8–27)
Bilirubin Total: 1.3 mg/dL — ABNORMAL HIGH (ref 0.0–1.2)
CO2: 27 mmol/L (ref 20–29)
Calcium: 9.2 mg/dL (ref 8.6–10.2)
Chloride: 94 mmol/L — ABNORMAL LOW (ref 96–106)
Creatinine, Ser: 1.72 mg/dL — ABNORMAL HIGH (ref 0.76–1.27)
Globulin, Total: 3.1 g/dL (ref 1.5–4.5)
Glucose: 104 mg/dL — ABNORMAL HIGH (ref 70–99)
Potassium: 3.6 mmol/L (ref 3.5–5.2)
Sodium: 137 mmol/L (ref 134–144)
Total Protein: 7.2 g/dL (ref 6.0–8.5)
eGFR: 41 mL/min/{1.73_m2} — ABNORMAL LOW (ref >59)

## 2021-04-08 LAB — TSH: TSH: 2.96 u[IU]/mL (ref 0.450–4.500)

## 2021-04-14 ENCOUNTER — Other Ambulatory Visit: Payer: Self-pay | Admitting: Family

## 2021-04-16 ENCOUNTER — Encounter: Payer: Self-pay | Admitting: *Deleted

## 2021-04-30 DIAGNOSIS — I129 Hypertensive chronic kidney disease with stage 1 through stage 4 chronic kidney disease, or unspecified chronic kidney disease: Secondary | ICD-10-CM | POA: Diagnosis not present

## 2021-04-30 DIAGNOSIS — R600 Localized edema: Secondary | ICD-10-CM | POA: Diagnosis not present

## 2021-04-30 DIAGNOSIS — E211 Secondary hyperparathyroidism, not elsewhere classified: Secondary | ICD-10-CM | POA: Diagnosis not present

## 2021-04-30 DIAGNOSIS — D631 Anemia in chronic kidney disease: Secondary | ICD-10-CM | POA: Diagnosis not present

## 2021-04-30 DIAGNOSIS — N1831 Chronic kidney disease, stage 3a: Secondary | ICD-10-CM | POA: Diagnosis not present

## 2021-05-06 DIAGNOSIS — N1831 Chronic kidney disease, stage 3a: Secondary | ICD-10-CM | POA: Diagnosis not present

## 2021-05-06 DIAGNOSIS — I129 Hypertensive chronic kidney disease with stage 1 through stage 4 chronic kidney disease, or unspecified chronic kidney disease: Secondary | ICD-10-CM | POA: Diagnosis not present

## 2021-05-06 DIAGNOSIS — E211 Secondary hyperparathyroidism, not elsewhere classified: Secondary | ICD-10-CM | POA: Diagnosis not present

## 2021-05-06 DIAGNOSIS — R600 Localized edema: Secondary | ICD-10-CM | POA: Diagnosis not present

## 2021-05-13 ENCOUNTER — Other Ambulatory Visit: Payer: Self-pay | Admitting: Family

## 2021-05-13 DIAGNOSIS — G6289 Other specified polyneuropathies: Secondary | ICD-10-CM

## 2021-06-05 DIAGNOSIS — Z79899 Other long term (current) drug therapy: Secondary | ICD-10-CM | POA: Diagnosis not present

## 2021-06-05 DIAGNOSIS — R600 Localized edema: Secondary | ICD-10-CM | POA: Diagnosis not present

## 2021-06-05 DIAGNOSIS — E211 Secondary hyperparathyroidism, not elsewhere classified: Secondary | ICD-10-CM | POA: Diagnosis not present

## 2021-06-05 DIAGNOSIS — I129 Hypertensive chronic kidney disease with stage 1 through stage 4 chronic kidney disease, or unspecified chronic kidney disease: Secondary | ICD-10-CM | POA: Diagnosis not present

## 2021-06-05 DIAGNOSIS — N1831 Chronic kidney disease, stage 3a: Secondary | ICD-10-CM | POA: Diagnosis not present

## 2021-06-10 DIAGNOSIS — I129 Hypertensive chronic kidney disease with stage 1 through stage 4 chronic kidney disease, or unspecified chronic kidney disease: Secondary | ICD-10-CM | POA: Diagnosis not present

## 2021-06-10 DIAGNOSIS — N1831 Chronic kidney disease, stage 3a: Secondary | ICD-10-CM | POA: Diagnosis not present

## 2021-06-10 DIAGNOSIS — N170 Acute kidney failure with tubular necrosis: Secondary | ICD-10-CM | POA: Diagnosis not present

## 2021-06-10 DIAGNOSIS — E876 Hypokalemia: Secondary | ICD-10-CM | POA: Diagnosis not present

## 2021-06-10 DIAGNOSIS — R7303 Prediabetes: Secondary | ICD-10-CM | POA: Diagnosis not present

## 2021-06-15 ENCOUNTER — Other Ambulatory Visit: Payer: Self-pay | Admitting: Family

## 2021-06-15 DIAGNOSIS — G6289 Other specified polyneuropathies: Secondary | ICD-10-CM

## 2021-07-09 ENCOUNTER — Encounter: Payer: Self-pay | Admitting: Family

## 2021-07-09 ENCOUNTER — Ambulatory Visit (INDEPENDENT_AMBULATORY_CARE_PROVIDER_SITE_OTHER): Payer: Medicare Other | Admitting: Family

## 2021-07-09 VITALS — BP 136/79 | Temp 97.3°F | Ht 71.0 in | Wt 266.0 lb

## 2021-07-09 DIAGNOSIS — Z0289 Encounter for other administrative examinations: Secondary | ICD-10-CM | POA: Diagnosis not present

## 2021-07-09 DIAGNOSIS — F1120 Opioid dependence, uncomplicated: Secondary | ICD-10-CM

## 2021-07-09 DIAGNOSIS — Z0001 Encounter for general adult medical examination with abnormal findings: Secondary | ICD-10-CM

## 2021-07-09 DIAGNOSIS — N1832 Chronic kidney disease, stage 3b: Secondary | ICD-10-CM

## 2021-07-09 DIAGNOSIS — E039 Hypothyroidism, unspecified: Secondary | ICD-10-CM | POA: Diagnosis not present

## 2021-07-09 DIAGNOSIS — I10 Essential (primary) hypertension: Secondary | ICD-10-CM

## 2021-07-09 DIAGNOSIS — M1990 Unspecified osteoarthritis, unspecified site: Secondary | ICD-10-CM | POA: Diagnosis not present

## 2021-07-09 DIAGNOSIS — J45909 Unspecified asthma, uncomplicated: Secondary | ICD-10-CM | POA: Diagnosis not present

## 2021-07-09 DIAGNOSIS — G4700 Insomnia, unspecified: Secondary | ICD-10-CM | POA: Diagnosis not present

## 2021-07-09 DIAGNOSIS — Z0000 Encounter for general adult medical examination without abnormal findings: Secondary | ICD-10-CM | POA: Diagnosis not present

## 2021-07-09 DIAGNOSIS — G8929 Other chronic pain: Secondary | ICD-10-CM

## 2021-07-09 DIAGNOSIS — M545 Low back pain, unspecified: Secondary | ICD-10-CM | POA: Diagnosis not present

## 2021-07-09 DIAGNOSIS — E785 Hyperlipidemia, unspecified: Secondary | ICD-10-CM | POA: Diagnosis not present

## 2021-07-09 MED ORDER — HYDROCODONE-ACETAMINOPHEN 5-325 MG PO TABS: 1.0000 | ORAL_TABLET | Freq: Two times a day (BID) | ORAL | 0 refills | Status: DC | PRN

## 2021-07-09 NOTE — Patient Instructions (Signed)
Health Maintenance After Age 74 After age 74, you are at a higher risk for certain long-term diseases and infections as well as injuries from falls. Falls are a major cause of broken bones and head injuries in people who are older than age 74. Getting regular preventive care can help to keep you healthy and well. Preventive care includes getting regular testing and making lifestyle changes as recommended by your health care provider. Talk with your health care provider about: Which screenings and tests you should have. A screening is a test that checks for a disease when you have no symptoms. A diet and exercise plan that is right for you. What should I know about screenings and tests to prevent falls? Screening and testing are the best ways to find a health problem early. Early diagnosis and treatment give you the best chance of managing medical conditions that are common after age 74. Certain conditions and lifestyle choices may make you more likely to have a fall. Your health care provider may recommend: Regular vision checks. Poor vision and conditions such as cataracts can make you more likely to have a fall. If you wear glasses, make sure to get your prescription updated if your vision changes. Medicine review. Work with your health care provider to regularly review all of the medicines you are taking, including over-the-counter medicines. Ask your health care provider about any side effects that may make you more likely to have a fall. Tell your health care provider if any medicines that you take make you feel dizzy or sleepy. Strength and balance checks. Your health care provider may recommend certain tests to check your strength and balance while standing, walking, or changing positions. Foot health exam. Foot pain and numbness, as well as not wearing proper footwear, can make you more likely to have a fall. Screenings, including: Osteoporosis screening. Osteoporosis is a condition that causes  the bones to get weaker and break more easily. Blood pressure screening. Blood pressure changes and medicines to control blood pressure can make you feel dizzy. Depression screening. You may be more likely to have a fall if you have a fear of falling, feel depressed, or feel unable to do activities that you used to do. Alcohol use screening. Using too much alcohol can affect your balance and may make you more likely to have a fall. Follow these instructions at home: Lifestyle Do not drink alcohol if: Your health care provider tells you not to drink. If you drink alcohol: Limit how much you have to: 0-1 drink a day for women. 0-2 drinks a day for men. Know how much alcohol is in your drink. In the [place] one drink equals one 12 oz bottle of beer (355 mL), one 5 oz glass of wine (148 mL), or one 1 oz glass of hard liquor (44 mL). Do not use any products that contain nicotine or tobacco. These products include cigarettes, chewing tobacco, and vaping devices, such as e-cigarettes. If you need help quitting, ask your health care provider. Activity  Follow a regular exercise program to stay fit. This will help you maintain your balance. Ask your health care provider what types of exercise are appropriate for you. If you need a cane or walker, use it as recommended by your health care provider. Wear supportive shoes that have nonskid soles. Safety  Remove any tripping hazards, such as rugs, cords, and clutter. Install safety equipment such as grab bars in bathrooms and safety rails on stairs. Keep rooms and walkways   well-lit. General instructions Talk with your health care provider about your risks for falling. Tell your health care provider if: You fall. Be sure to tell your health care provider about all falls, even ones that seem minor. You feel dizzy, tiredness (fatigue), or off-balance. Take over-the-counter and prescription medicines only as told by your health care provider. These include  supplements. Eat a healthy diet and maintain a healthy weight. A healthy diet includes low-fat dairy products, low-fat (lean) meats, and fiber from whole grains, beans, and lots of fruits and vegetables. Stay current with your vaccines. Schedule regular health, dental, and eye exams. Summary Having a healthy lifestyle and getting preventive care can help to protect your health and wellness after age 74. Screening and testing are the best way to find a health problem early and help you avoid having a fall. Early diagnosis and treatment give you the best chance for managing medical conditions that are more common for people who are older than age 74. Falls are a major cause of broken bones and head injuries in people who are older than age 74. Take precautions to prevent a fall at home. Work with your health care provider to learn what changes you can make to improve your health and wellness and to prevent falls. This information is not intended to replace advice given to you by your health care provider. Make sure you discuss any questions you have with your health care provider. Document Revised: 10/27/2020 Document Reviewed: 10/27/2020 Elsevier Patient Education  2022 [employer] Inc[employer]  

## 2021-07-09 NOTE — Progress Notes (Signed)
Subjective:    Patient ID: Andrew Phelps, male    DOB: 1945-09-28, 76 y.o.   MRN: [id number]  Chief Complaint  Patient presents with   Medical Management of Chronic Issues   PT presents the office today for CPE and chronic follow up and pain medication refill. PT is followed by Nephrologists every 2 months for CKD.  He has peripheral edema.   He is considered morbid obese with a BMI of 37 and co morbidity of HTN and CKD.   Back Pain This is a chronic problem. The current episode started more than 1 year ago. The problem has been waxing and waning since onset. The pain is present in the lumbar spine. The quality of the pain is described as aching. The pain is at a severity of 8/10. The pain is moderate. Associated symptoms include leg pain. Pertinent negatives include no dysuria, fever, paresis, paresthesias or pelvic pain. He has tried analgesics for the symptoms. The treatment provided mild relief.  Hypertension This is a chronic problem. The current episode started more than 1 year ago. The problem has been resolved since onset. The problem is controlled. Associated symptoms include peripheral edema. Pertinent negatives include no malaise/fatigue, palpitations or shortness of breath. Risk factors for coronary artery disease include dyslipidemia, obesity, male gender and sedentary lifestyle. The current treatment provides moderate improvement. There is no history of heart failure. Identifiable causes of hypertension include a thyroid problem.  Asthma There is no cough or shortness of breath. This is a chronic problem. The current episode started more than 1 year ago. The problem occurs intermittently. The problem has been waxing and waning. Pertinent negatives include no fever or malaise/fatigue. His symptoms are alleviated by rest. He reports significant improvement on treatment. His past medical history is significant for asthma.  Thyroid Problem Presents for follow-up visit. Patient  reports no constipation, depressed mood, fatigue or palpitations. The symptoms have been stable. His past medical history is significant for hyperlipidemia. There is no history of heart failure.  Arthritis Presents for follow-up visit. He complains of pain and stiffness. The symptoms have been improving. Affected locations include the left knee and right knee. His pain is at a severity of 8/10. Pertinent negatives include no dysuria, fatigue or fever.  Insomnia Primary symptoms: difficulty falling asleep, frequent awakening, no malaise/fatigue.   The current episode started more than one year. The onset quality is gradual. The problem occurs intermittently.  Hyperlipidemia This is a chronic problem. The current episode started more than 1 year ago. Exacerbating diseases include obesity. Associated symptoms include leg pain. Pertinent negatives include no shortness of breath. Current antihyperlipidemic treatment includes statins. The current treatment provides moderate improvement of lipids. Risk factors for coronary artery disease include dyslipidemia, male sex, hypertension and a sedentary lifestyle.   Pain assessment: Cause of pain- lower back Pain on scale of 1-10- 8 Frequency- constantly What increases pain-walking What makes pain Better-resting and pain medication Effects on ADL - none Any change in general medical condition-none  Current opioids rx- Norco 5-325 mg, # meds rx- 60 Effectiveness of current meds-good Adverse reactions from pain meds-none Morphine equivalent- 9 per day  Pill count performed-No Last drug screen - 01/08/21 ( high risk q10m moderate risk q63mlow risk yearly ) Urine drug screen today- No [place]- yes  If yes were their any concerning findings? - none   Overdose risk: low No flowsheet data found.   Pain contract signed on:10/01/20   Review  of Systems  Constitutional:  Negative for fatigue, fever and malaise/fatigue.  Respiratory:   Negative for cough and shortness of breath.   Cardiovascular:  Negative for palpitations.  Gastrointestinal:  Negative for constipation.  Genitourinary:  Negative for dysuria and pelvic pain.  Musculoskeletal:  Positive for arthritis, back pain and stiffness.  Neurological:  Negative for paresthesias.  Psychiatric/Behavioral:  The patient has insomnia.   All other systems reviewed and are negative.  Family History  Problem Relation Age of Onset   Heart disease Mother    Heart disease Father    Hypertension Brother    Hyperlipidemia Brother    Heart disease Sister    Heart disease Brother    Heart disease Brother    Cancer Brother    Colon cancer Neg Hx    Liver disease Neg Hx    Social History   Socioeconomic History   Marital status: Divorced    Spouse name: Not on file   Number of children: 3   Years of education: 8    Highest education level: 8th grade  Occupational History   Occupation: Retired  Tobacco Use   Smoking status: Former    Packs/day: 2.00    Years: 25.00    Pack years: 50.00    Types: Cigarettes    Quit date: 07/06/1995    Years since quitting: 26.0   Smokeless tobacco: Current    Types: Chew  Vaping Use   Vaping Use: Never used  Substance and Sexual Activity   Alcohol use: Not Currently    Comment: stopped 3 months ago   Drug use: No   Sexual activity: Yes  Other Topics Concern   Not on file  Social History Narrative   Not on file   Social Determinants of Health   Financial Resource Strain: Not on file  Food Insecurity: Not on file  Transportation Needs: Not on file  Physical Activity: Not on file  Stress: Not on file  Social Connections: Not on file       Objective:   Physical Exam Vitals reviewed.  Constitutional:      General: He is not in acute distress.    Appearance: He is well-developed. He is obese.  HENT:     Head: Normocephalic.     Right Ear: Tympanic membrane normal.     Left Ear: Tympanic membrane normal.  Eyes:      General:        Right eye: No discharge.        Left eye: No discharge.     Pupils: Pupils are equal, round, and reactive to light.  Neck:     Thyroid: No thyromegaly.  Cardiovascular:     Rate and Rhythm: Normal rate and regular rhythm.     Heart sounds: Normal heart sounds. No murmur heard. Pulmonary:     Effort: Pulmonary effort is normal. No respiratory distress.     Breath sounds: Normal breath sounds. No wheezing.  Abdominal:     General: Bowel sounds are normal. There is no distension.     Palpations: Abdomen is soft.     Tenderness: There is no abdominal tenderness.  Musculoskeletal:        General: No tenderness. Normal range of motion.     Cervical back: Normal range of motion and neck supple.     Comments: Low back pain with flexion and extension  Skin:    General: Skin is warm and dry.     Findings: No erythema  or rash.  Neurological:     Mental Status: He is alert and oriented to person, place, and time.     Cranial Nerves: No cranial nerve deficit.     Deep Tendon Reflexes: Reflexes are normal and symmetric.  Psychiatric:        Behavior: Behavior normal.        Thought Content: Thought content normal.        Judgment: Judgment normal.      BP 136/79    Temp (!) 97.3 F (36.3 C) (Temporal)    Ht _0  (1.803 m)    Wt 266 lb (120.7 kg)    BMI 37.10 kg/m      Assessment & Plan:  Archimedes Harold comes in today with chief complaint of Medical Management of Chronic Issues   Diagnosis and orders addressed:  1. Arthritis - HYDROcodone-acetaminophen (NORCO/VICODIN) 5-325 MG tablet; Take 1 tablet by mouth every 12 (twelve) hours as needed for moderate pain.  Dispense: 60 tablet; Refill: 0 - HYDROcodone-acetaminophen (NORCO) 5-325 MG tablet; Take 1 tablet by mouth every 12 (twelve) hours as needed for moderate pain.  Dispense: 60 tablet; Refill: 0 - HYDROcodone-acetaminophen (NORCO) 5-325 MG tablet; Take 1 tablet by mouth every 12 (twelve) hours as  needed for moderate pain.  Dispense: 60 tablet; Refill: 0 - CMP14+EGFR - CBC with Differential/Platelet  2. Pain medication agreement signed - HYDROcodone-acetaminophen (NORCO/VICODIN) 5-325 MG tablet; Take 1 tablet by mouth every 12 (twelve) hours as needed for moderate pain.  Dispense: 60 tablet; Refill: 0 - HYDROcodone-acetaminophen (NORCO) 5-325 MG tablet; Take 1 tablet by mouth every 12 (twelve) hours as needed for moderate pain.  Dispense: 60 tablet; Refill: 0 - HYDROcodone-acetaminophen (NORCO) 5-325 MG tablet; Take 1 tablet by mouth every 12 (twelve) hours as needed for moderate pain.  Dispense: 60 tablet; Refill: 0 - CMP14+EGFR - CBC with Differential/Platelet  3. Uncomplicated opioid dependence (HCC) - HYDROcodone-acetaminophen (NORCO/VICODIN) 5-325 MG tablet; Take 1 tablet by mouth every 12 (twelve) hours as needed for moderate pain.  Dispense: 60 tablet; Refill: 0 - HYDROcodone-acetaminophen (NORCO) 5-325 MG tablet; Take 1 tablet by mouth every 12 (twelve) hours as needed for moderate pain.  Dispense: 60 tablet; Refill: 0 - HYDROcodone-acetaminophen (NORCO) 5-325 MG tablet; Take 1 tablet by mouth every 12 (twelve) hours as needed for moderate pain.  Dispense: 60 tablet; Refill: 0 - CMP14+EGFR - CBC with Differential/Platelet  4. Chronic bilateral low back pain without sciatica - HYDROcodone-acetaminophen (NORCO/VICODIN) 5-325 MG tablet; Take 1 tablet by mouth every 12 (twelve) hours as needed for moderate pain.  Dispense: 60 tablet; Refill: 0 - HYDROcodone-acetaminophen (NORCO) 5-325 MG tablet; Take 1 tablet by mouth every 12 (twelve) hours as needed for moderate pain.  Dispense: 60 tablet; Refill: 0 - HYDROcodone-acetaminophen (NORCO) 5-325 MG tablet; Take 1 tablet by mouth every 12 (twelve) hours as needed for moderate pain.  Dispense: 60 tablet; Refill: 0 - CMP14+EGFR - CBC with Differential/Platelet  5. Essential hypertension - CMP14+EGFR - CBC with  Differential/Platelet  6. Chronic asthma without complication, unspecified asthma severity, unspecified whether persistent - CMP14+EGFR - CBC with Differential/Platelet  7. Hypothyroidism, unspecified type - CMP14+EGFR - CBC with Differential/Platelet - TSH  8. Stage 3b chronic kidney disease (HCC) - CMP14+EGFR - CBC with Differential/Platelet  9. Hyperlipidemia, unspecified hyperlipidemia type - CMP14+EGFR - CBC with Differential/Platelet - Lipid panel  10. Morbid obesity ([place]) - CMP14+EGFR - CBC with Differential/Platelet  11. Insomnia, unspecified type - CMP14+EGFR - CBC with Differential/Platelet  12. Annual physical exam - CMP14+EGFR - CBC with Differential/Platelet - PSA, total and free - Lipid panel - TSH   Labs pending Patient reviewed in [place] controlled database, no flags noted. Contract and drug screen are up to date.  Health Maintenance reviewed Diet and exercise encouraged  Follow up plan: 3 months    Evelina Dun, FNP

## 2021-07-10 LAB — CMP14+EGFR
ALT: 33 IU/L (ref 0–44)
AST: 31 IU/L (ref 0–40)
Albumin/Globulin Ratio: 1.3 (ref 1.2–2.2)
Albumin: 4.4 g/dL (ref 3.7–4.7)
Alkaline Phosphatase: 101 IU/L (ref 44–121)
BUN/Creatinine Ratio: 14 (ref 10–24)
BUN: 26 mg/dL (ref 8–27)
Bilirubin Total: 1.3 mg/dL — ABNORMAL HIGH (ref 0.0–1.2)
CO2: 25 mmol/L (ref 20–29)
Calcium: 9.7 mg/dL (ref 8.6–10.2)
Chloride: 96 mmol/L (ref 96–106)
Creatinine, Ser: 1.85 mg/dL — ABNORMAL HIGH (ref 0.76–1.27)
Globulin, Total: 3.3 g/dL (ref 1.5–4.5)
Glucose: 83 mg/dL (ref 70–99)
Potassium: 4 mmol/L (ref 3.5–5.2)
Sodium: 139 mmol/L (ref 134–144)
Total Protein: 7.7 g/dL (ref 6.0–8.5)
eGFR: 38 mL/min/{1.73_m2} — ABNORMAL LOW (ref >59)

## 2021-07-10 LAB — CBC WITH DIFFERENTIAL/PLATELET
Basophils Absolute: 0.1 10*3/uL (ref 0.0–0.2)
Basos: 1 %
EOS (ABSOLUTE): 0.3 10*3/uL (ref 0.0–0.4)
Eos: 2 %
Hematocrit: 43.1 % (ref 37.5–51.0)
Hemoglobin: 14.3 g/dL (ref 13.0–17.7)
Immature Grans (Abs): 0 10*3/uL (ref 0.0–0.1)
Immature Granulocytes: 0 %
Lymphocytes Absolute: 2.7 10*3/uL (ref 0.7–3.1)
Lymphs: 22 %
MCH: 29.4 pg (ref 26.6–33.0)
MCHC: 33.2 g/dL (ref 31.5–35.7)
MCV: 89 fL (ref 79–97)
Monocytes Absolute: 0.7 10*3/uL (ref 0.1–0.9)
Monocytes: 6 %
Neutrophils Absolute: 8.6 10*3/uL — ABNORMAL HIGH (ref 1.4–7.0)
Neutrophils: 69 %
Platelets: 224 10*3/uL (ref 150–450)
RBC: 4.86 x10E6/uL (ref 4.14–5.80)
RDW: 13.5 % (ref 11.6–15.4)
WBC: 12.5 10*3/uL — ABNORMAL HIGH (ref 3.4–10.8)

## 2021-07-10 LAB — PSA, TOTAL AND FREE
PSA, Free Pct: 37 %
PSA, Free: 0.37 ng/mL
Prostate Specific Ag, Serum: 1 ng/mL (ref 0.0–4.0)

## 2021-07-10 LAB — LIPID PANEL
Chol/HDL Ratio: 2.9 ratio (ref 0.0–5.0)
Cholesterol, Total: 110 mg/dL (ref 100–199)
HDL: 38 mg/dL — ABNORMAL LOW (ref >39)
LDL Chol Calc (NIH): 54 mg/dL (ref 0–99)
Triglycerides: 96 mg/dL (ref 0–149)
VLDL Cholesterol Cal: 18 mg/dL (ref 5–40)

## 2021-07-10 LAB — TSH: TSH: 2.03 u[IU]/mL (ref 0.450–4.500)

## 2021-07-16 ENCOUNTER — Other Ambulatory Visit: Payer: Self-pay | Admitting: Family

## 2021-07-16 DIAGNOSIS — G6289 Other specified polyneuropathies: Secondary | ICD-10-CM

## 2021-07-16 DIAGNOSIS — J45909 Unspecified asthma, uncomplicated: Secondary | ICD-10-CM

## 2021-08-07 DIAGNOSIS — D519 Vitamin B12 deficiency anemia, unspecified: Secondary | ICD-10-CM | POA: Diagnosis not present

## 2021-08-07 DIAGNOSIS — E876 Hypokalemia: Secondary | ICD-10-CM | POA: Diagnosis not present

## 2021-08-07 DIAGNOSIS — N1831 Chronic kidney disease, stage 3a: Secondary | ICD-10-CM | POA: Diagnosis not present

## 2021-08-07 DIAGNOSIS — E559 Vitamin D deficiency, unspecified: Secondary | ICD-10-CM | POA: Diagnosis not present

## 2021-08-07 DIAGNOSIS — Z79899 Other long term (current) drug therapy: Secondary | ICD-10-CM | POA: Diagnosis not present

## 2021-08-07 DIAGNOSIS — R7303 Prediabetes: Secondary | ICD-10-CM | POA: Diagnosis not present

## 2021-08-07 DIAGNOSIS — N170 Acute kidney failure with tubular necrosis: Secondary | ICD-10-CM | POA: Diagnosis not present

## 2021-08-07 DIAGNOSIS — I129 Hypertensive chronic kidney disease with stage 1 through stage 4 chronic kidney disease, or unspecified chronic kidney disease: Secondary | ICD-10-CM | POA: Diagnosis not present

## 2021-08-13 DIAGNOSIS — I129 Hypertensive chronic kidney disease with stage 1 through stage 4 chronic kidney disease, or unspecified chronic kidney disease: Secondary | ICD-10-CM | POA: Diagnosis not present

## 2021-08-13 DIAGNOSIS — R600 Localized edema: Secondary | ICD-10-CM | POA: Diagnosis not present

## 2021-08-13 DIAGNOSIS — E211 Secondary hyperparathyroidism, not elsewhere classified: Secondary | ICD-10-CM | POA: Diagnosis not present

## 2021-08-13 DIAGNOSIS — E876 Hypokalemia: Secondary | ICD-10-CM | POA: Diagnosis not present

## 2021-08-13 DIAGNOSIS — N1832 Chronic kidney disease, stage 3b: Secondary | ICD-10-CM | POA: Diagnosis not present

## 2021-08-14 ENCOUNTER — Other Ambulatory Visit: Payer: Self-pay | Admitting: Family

## 2021-08-14 DIAGNOSIS — J302 Other seasonal allergic rhinitis: Secondary | ICD-10-CM

## 2021-09-15 ENCOUNTER — Other Ambulatory Visit: Payer: Self-pay | Admitting: Family

## 2021-09-15 DIAGNOSIS — G6289 Other specified polyneuropathies: Secondary | ICD-10-CM

## 2021-10-07 DIAGNOSIS — N1832 Chronic kidney disease, stage 3b: Secondary | ICD-10-CM | POA: Diagnosis not present

## 2021-10-07 DIAGNOSIS — E876 Hypokalemia: Secondary | ICD-10-CM | POA: Diagnosis not present

## 2021-10-07 DIAGNOSIS — E211 Secondary hyperparathyroidism, not elsewhere classified: Secondary | ICD-10-CM | POA: Diagnosis not present

## 2021-10-07 DIAGNOSIS — I129 Hypertensive chronic kidney disease with stage 1 through stage 4 chronic kidney disease, or unspecified chronic kidney disease: Secondary | ICD-10-CM | POA: Diagnosis not present

## 2021-10-07 DIAGNOSIS — R7303 Prediabetes: Secondary | ICD-10-CM | POA: Diagnosis not present

## 2021-10-08 ENCOUNTER — Encounter: Payer: Self-pay | Admitting: Family

## 2021-10-08 ENCOUNTER — Ambulatory Visit (INDEPENDENT_AMBULATORY_CARE_PROVIDER_SITE_OTHER): Payer: Medicare Other | Admitting: Family

## 2021-10-08 VITALS — BP 134/80 | HR 73 | Temp 97.9°F | Ht 71.0 in | Wt 261.6 lb

## 2021-10-08 DIAGNOSIS — M1990 Unspecified osteoarthritis, unspecified site: Secondary | ICD-10-CM | POA: Diagnosis not present

## 2021-10-08 DIAGNOSIS — I10 Essential (primary) hypertension: Secondary | ICD-10-CM | POA: Diagnosis not present

## 2021-10-08 DIAGNOSIS — E039 Hypothyroidism, unspecified: Secondary | ICD-10-CM

## 2021-10-08 DIAGNOSIS — J45909 Unspecified asthma, uncomplicated: Secondary | ICD-10-CM | POA: Diagnosis not present

## 2021-10-08 DIAGNOSIS — E785 Hyperlipidemia, unspecified: Secondary | ICD-10-CM | POA: Diagnosis not present

## 2021-10-08 DIAGNOSIS — N1832 Chronic kidney disease, stage 3b: Secondary | ICD-10-CM | POA: Diagnosis not present

## 2021-10-08 DIAGNOSIS — M545 Low back pain, unspecified: Secondary | ICD-10-CM | POA: Diagnosis not present

## 2021-10-08 DIAGNOSIS — Z0289 Encounter for other administrative examinations: Secondary | ICD-10-CM

## 2021-10-08 DIAGNOSIS — G6289 Other specified polyneuropathies: Secondary | ICD-10-CM

## 2021-10-08 DIAGNOSIS — D649 Anemia, unspecified: Secondary | ICD-10-CM | POA: Diagnosis not present

## 2021-10-08 DIAGNOSIS — G8929 Other chronic pain: Secondary | ICD-10-CM

## 2021-10-08 DIAGNOSIS — G4700 Insomnia, unspecified: Secondary | ICD-10-CM

## 2021-10-08 DIAGNOSIS — F1120 Opioid dependence, uncomplicated: Secondary | ICD-10-CM | POA: Diagnosis not present

## 2021-10-08 MED ORDER — GABAPENTIN 600 MG PO TABS: 600.0000 mg | ORAL_TABLET | Freq: Three times a day (TID) | ORAL | 1 refills | Status: DC

## 2021-10-08 MED ORDER — HYDROCODONE-ACETAMINOPHEN 5-325 MG PO TABS: 1.0000 | ORAL_TABLET | Freq: Two times a day (BID) | ORAL | 0 refills | Status: DC | PRN

## 2021-10-08 MED ORDER — FUROSEMIDE 20 MG PO TABS: 20.0000 mg | ORAL_TABLET | Freq: Two times a day (BID) | ORAL | 3 refills | Status: DC

## 2021-10-08 MED ORDER — LEVOTHYROXINE SODIUM 112 MCG PO TABS: 112.0000 ug | ORAL_TABLET | Freq: Every day | ORAL | 3 refills | Status: DC

## 2021-10-08 MED ORDER — SIMVASTATIN 20 MG PO TABS: 20.0000 mg | ORAL_TABLET | ORAL | 3 refills | Status: DC

## 2021-10-08 MED ORDER — ALLOPURINOL 300 MG PO TABS: 300.0000 mg | ORAL_TABLET | Freq: Every day | ORAL | 1 refills | Status: DC

## 2021-10-08 NOTE — Progress Notes (Signed)
? ?Subjective:  ? ? Patient ID: Andrew Phelps, male    DOB: 06-14-1946, 76 y.o.   MRN: [id number] ? ?Chief Complaint  ?Patient presents with  ? Medical Management of Chronic Issues  ? Rash  ?  Out in sun  ? ?PT presents the office today for chronic follow up and pain medication refill. PT is followed by Nephrologists every 2 months for CKD.  He has peripheral edema. ?  ?He is considered morbid obese with a BMI of 36 and co morbidity of HTN and CKD.   ?Rash ?This is a new problem. The current episode started yesterday. The problem has been waxing and waning since onset. Location: bilateral arms after sun exposure. The rash is characterized by redness. Associated with: sun. Pertinent negatives include no cough, diarrhea, fatigue or shortness of breath. Past treatments include anti-itch cream and moisturizer. The treatment provided mild relief. His past medical history is significant for asthma.  ?Hypertension ?This is a chronic problem. The current episode started more than 1 year ago. The problem has been resolved since onset. The problem is controlled. Associated symptoms include malaise/fatigue and peripheral edema. Pertinent negatives include no shortness of breath. Risk factors for coronary artery disease include dyslipidemia, obesity and male gender. The current treatment provides moderate improvement. Identifiable causes of hypertension include a thyroid problem.  ?Asthma ?There is no cough, shortness of breath or wheezing. This is a chronic problem. The current episode started more than 1 year ago. The problem occurs intermittently. The problem has been waxing and waning. Associated symptoms include malaise/fatigue. His symptoms are alleviated by rest. He reports moderate improvement on treatment. His past medical history is significant for asthma.  ?Thyroid Problem ?Presents for follow-up visit. Symptoms include constipation and dry skin. Patient reports no anxiety, diarrhea or fatigue. The symptoms  have been stable. His past medical history is significant for hyperlipidemia.  ?Arthritis ?Presents for follow-up visit. He complains of pain and stiffness. The symptoms have been stable. Affected locations include the left knee, right knee, left MCP, right MCP, left foot and right foot. His pain is at a severity of 9/10. Associated symptoms include rash. Pertinent negatives include no diarrhea or fatigue.  ?Hyperlipidemia ?This is a chronic problem. The current episode started more than 1 year ago. Exacerbating diseases include obesity. Pertinent negatives include no shortness of breath. Current antihyperlipidemic treatment includes statins. The current treatment provides moderate improvement of lipids. Risk factors for coronary artery disease include dyslipidemia, hypertension, a sedentary lifestyle, family history, obesity and male sex.  ?Insomnia ?Primary symptoms: difficulty falling asleep, frequent awakening, malaise/fatigue.   ?The current episode started more than one year. The onset quality is gradual. The problem occurs intermittently.  ?Back Pain ?This is a chronic problem. The current episode started more than 1 year ago. The problem occurs intermittently. The problem has been waxing and waning since onset. The pain is present in the lumbar spine. The pain is moderate. He has tried analgesics for the symptoms. The treatment provided moderate relief.  ?Anemia ?Presents for follow-up visit. Symptoms include malaise/fatigue. There has been no bruising/bleeding easily.  ? ? ?Current opioids rx- Norco 5-325 mg ?# meds rx- 2 ?Effectiveness of current meds-stable ?Adverse reactions from pain meds-none ?Morphine equivalent- 9.67 ? ?Pill count performed-No ?Last drug screen - today ?( high risk q62m moderate risk q687mlow risk yearly ) ?Urine drug screen today- Yes ?[place] ? If yes were their any concerning findings? - none ? ?Pain contract  signed on: 10/08/21 ? ? ? ?Review of Systems   ?Constitutional:  Positive for malaise/fatigue. Negative for fatigue.  ?Respiratory:  Negative for cough, shortness of breath and wheezing.   ?Gastrointestinal:  Positive for constipation. Negative for diarrhea.  ?Musculoskeletal:  Positive for arthritis, back pain and stiffness.  ?Skin:  Positive for rash.  ?Hematological:  Does not bruise/bleed easily.  ?Psychiatric/Behavioral:  The patient has insomnia. The patient is not nervous/anxious.   ?All other systems reviewed and are negative. ? ?   ?Objective:  ? Physical Exam ?Vitals reviewed.  ?Constitutional:   ?   General: He is not in acute distress. ?   Appearance: He is well-developed. He is obese.  ?HENT:  ?   Head: Normocephalic.  ?   Right Ear: Tympanic membrane normal.  ?   Left Ear: Tympanic membrane normal.  ?Eyes:  ?   General:     ?   Right eye: No discharge.     ?   Left eye: No discharge.  ?   Pupils: Pupils are equal, round, and reactive to light.  ?Neck:  ?   Thyroid: No thyromegaly.  ?Cardiovascular:  ?   Rate and Rhythm: Normal rate and regular rhythm.  ?   Heart sounds: Normal heart sounds. No murmur heard. ?Pulmonary:  ?   Effort: Pulmonary effort is normal. No respiratory distress.  ?   Breath sounds: Normal breath sounds. No wheezing.  ?Abdominal:  ?   General: Bowel sounds are normal. There is no distension.  ?   Palpations: Abdomen is soft.  ?   Tenderness: There is no abdominal tenderness.  ?Musculoskeletal:     ?   General: No tenderness. Normal range of motion.  ?   Cervical back: Normal range of motion and neck supple.  ?   Right lower leg: Edema (2+) present.  ?   Left lower leg: Edema (2+) present.  ?Skin: ?   General: Skin is warm and dry.  ?   Findings: No erythema or rash.  ?Neurological:  ?   Mental Status: He is alert and oriented to person, place, and time.  ?   Cranial Nerves: No cranial nerve deficit.  ?   Deep Tendon Reflexes: Reflexes are normal and symmetric.  ?Psychiatric:     ?   Behavior: Behavior normal.     ?   Thought  Content: Thought content normal.     ?   Judgment: Judgment normal.  ? ? ? ? ? ?  BP 134/80   Pulse 73   Temp 97.9 ?F (36.6 ?C) (Temporal)   Ht '5\' 11"'$  (1.803 m)   Wt 261 lb 9.6 oz (118.7 kg)   BMI 36.49 kg/m?  ? ?Assessment & Plan:  ?Andrew Phelps comes in today with chief complaint of Medical Management of Chronic Issues and Rash (Out in sun) ? ? ?Diagnosis and orders addressed: ? ?1. Arthritis ?- HYDROcodone-acetaminophen (NORCO/VICODIN) 5-325 MG tablet; Take 1 tablet by mouth every 12 (twelve) hours as needed for moderate pain.  Dispense: 60 tablet; Refill: 0 ?- HYDROcodone-acetaminophen (NORCO) 5-325 MG tablet; Take 1 tablet by mouth every 12 (twelve) hours as needed for moderate pain.  Dispense: 60 tablet; Refill: 0 ?- HYDROcodone-acetaminophen (NORCO) 5-325 MG tablet; Take 1 tablet by mouth every 12 (twelve) hours as needed for moderate pain.  Dispense: 60 tablet; Refill: 0 ?- ToxASSURE Select 13 (MW), Urine ? ?2. Pain medication agreement signed ?- HYDROcodone-acetaminophen (NORCO/VICODIN) 5-325 MG tablet; Take 1 tablet  by mouth every 12 (twelve) hours as needed for moderate pain.  Dispense: 60 tablet; Refill: 0 ?- HYDROcodone-acetaminophen (NORCO) 5-325 MG tablet; Take 1 tablet by mouth every 12 (twelve) hours as needed for moderate pain.  Dispense: 60 tablet; Refill: 0 ?- HYDROcodone-acetaminophen (NORCO) 5-325 MG tablet; Take 1 tablet by mouth every 12 (twelve) hours as needed for moderate pain.  Dispense: 60 tablet; Refill: 0 ?- ToxASSURE Select 13 (MW), Urine ? ?3. Uncomplicated opioid dependence (HCC) ?- HYDROcodone-acetaminophen (NORCO/VICODIN) 5-325 MG tablet; Take 1 tablet by mouth every 12 (twelve) hours as needed for moderate pain.  Dispense: 60 tablet; Refill: 0 ?- HYDROcodone-acetaminophen (NORCO) 5-325 MG tablet; Take 1 tablet by mouth every 12 (twelve) hours as needed for moderate pain.  Dispense: 60 tablet; Refill: 0 ?- HYDROcodone-acetaminophen (NORCO) 5-325 MG tablet; Take 1  tablet by mouth every 12 (twelve) hours as needed for moderate pain.  Dispense: 60 tablet; Refill: 0 ?- ToxASSURE Select 13 (MW), Urine ? ?4. Chronic bilateral low back pain without sciatica ?- HYDROcodone-acetamin

## 2021-10-08 NOTE — Patient Instructions (Signed)
Health Maintenance After Age 74 After age 74, you are at a higher risk for certain long-term diseases and infections as well as injuries from falls. Falls are a major cause of broken bones and head injuries in people who are older than age 74. Getting regular preventive care can help to keep you healthy and well. Preventive care includes getting regular testing and making lifestyle changes as recommended by your health care provider. Talk with your health care provider about: Which screenings and tests you should have. A screening is a test that checks for a disease when you have no symptoms. A diet and exercise plan that is right for you. What should I know about screenings and tests to prevent falls? Screening and testing are the best ways to find a health problem early. Early diagnosis and treatment give you the best chance of managing medical conditions that are common after age 74. Certain conditions and lifestyle choices may make you more likely to have a fall. Your health care provider may recommend: Regular vision checks. Poor vision and conditions such as cataracts can make you more likely to have a fall. If you wear glasses, make sure to get your prescription updated if your vision changes. Medicine review. Work with your health care provider to regularly review all of the medicines you are taking, including over-the-counter medicines. Ask your health care provider about any side effects that may make you more likely to have a fall. Tell your health care provider if any medicines that you take make you feel dizzy or sleepy. Strength and balance checks. Your health care provider may recommend certain tests to check your strength and balance while standing, walking, or changing positions. Foot health exam. Foot pain and numbness, as well as not wearing proper footwear, can make you more likely to have a fall. Screenings, including: Osteoporosis screening. Osteoporosis is a condition that causes  the bones to get weaker and break more easily. Blood pressure screening. Blood pressure changes and medicines to control blood pressure can make you feel dizzy. Depression screening. You may be more likely to have a fall if you have a fear of falling, feel depressed, or feel unable to do activities that you used to do. Alcohol use screening. Using too much alcohol can affect your balance and may make you more likely to have a fall. Follow these instructions at home: Lifestyle Do not drink alcohol if: Your health care provider tells you not to drink. If you drink alcohol: Limit how much you have to: 0-1 drink a day for women. 0-2 drinks a day for men. Know how much alcohol is in your drink. In the [place] one drink equals one 12 oz bottle of beer (355 mL), one 5 oz glass of wine (148 mL), or one 1 oz glass of hard liquor (44 mL). Do not use any products that contain nicotine or tobacco. These products include cigarettes, chewing tobacco, and vaping devices, such as e-cigarettes. If you need help quitting, ask your health care provider. Activity  Follow a regular exercise program to stay fit. This will help you maintain your balance. Ask your health care provider what types of exercise are appropriate for you. If you need a cane or walker, use it as recommended by your health care provider. Wear supportive shoes that have nonskid soles. Safety  Remove any tripping hazards, such as rugs, cords, and clutter. Install safety equipment such as grab bars in bathrooms and safety rails on stairs. Keep rooms and walkways   well-lit. General instructions Talk with your health care provider about your risks for falling. Tell your health care provider if: You fall. Be sure to tell your health care provider about all falls, even ones that seem minor. You feel dizzy, tiredness (fatigue), or off-balance. Take over-the-counter and prescription medicines only as told by your health care provider. These include  supplements. Eat a healthy diet and maintain a healthy weight. A healthy diet includes low-fat dairy products, low-fat (lean) meats, and fiber from whole grains, beans, and lots of fruits and vegetables. Stay current with your vaccines. Schedule regular health, dental, and eye exams. Summary Having a healthy lifestyle and getting preventive care can help to protect your health and wellness after age 74. Screening and testing are the best way to find a health problem early and help you avoid having a fall. Early diagnosis and treatment give you the best chance for managing medical conditions that are more common for people who are older than age 74. Falls are a major cause of broken bones and head injuries in people who are older than age 74. Take precautions to prevent a fall at home. Work with your health care provider to learn what changes you can make to improve your health and wellness and to prevent falls. This information is not intended to replace advice given to you by your health care provider. Make sure you discuss any questions you have with your health care provider. Document Revised: 10/27/2020 Document Reviewed: 10/27/2020 Elsevier Patient Education  2023 Elsevier Inc.  

## 2021-10-09 ENCOUNTER — Ambulatory Visit (INDEPENDENT_AMBULATORY_CARE_PROVIDER_SITE_OTHER): Payer: Medicare Other

## 2021-10-09 VITALS — Wt 261.0 lb

## 2021-10-09 DIAGNOSIS — Z0000 Encounter for general adult medical examination without abnormal findings: Secondary | ICD-10-CM | POA: Diagnosis not present

## 2021-10-09 NOTE — Progress Notes (Signed)
? ?Subjective:  ? Andrew Phelps is a 76 y.o. male who presents for Medicare Annual/Subsequent preventive examination. ? ?Virtual Visit via Telephone Note ? ?I connected with  Andrew Phelps on 10/09/21 at  3:30 PM EDT by telephone and verified that I am speaking with the correct person using two identifiers. ? ?Location: ?Patient: Home ?Provider: [facility] ?Persons participating in the virtual visit: patient/Nurse Health Advisor ?  ?I discussed the limitations, risks, security and privacy concerns of performing an evaluation and management service by telephone and the availability of in person appointments. The patient expressed understanding and agreed to proceed. ? ?Interactive audio and video telecommunications were attempted between this nurse and patient, however failed, due to patient having technical difficulties OR patient did not have access to video capability.  We continued and completed visit with audio only. ? ?Some vital signs may be absent or patient reported.  ? ?Kenniya Westrich Dionne Ano, LPN  ? ?Review of Systems    ? ?Cardiac Risk Factors include: advanced age (>63mn, >>40women);dyslipidemia;hypertension;male gender;smoking/ tobacco exposure;sedentary lifestyle;obesity (BMI >30kg/m2) ? ?   ?Objective:  ?  ?Today's Vitals  ? 10/09/21 1525  ?Weight: 261 lb (118.4 kg)  ?PainSc: 8   ? ?Body mass index is 36.4 kg/m?. ? ? ?  10/09/2021  ?  3:37 PM 10/07/2020  ? 10:07 AM 11/01/2019  ? 12:43 PM 10/05/2019  ? 10:43 AM 07/14/2017  ?  8:46 AM  ?Advanced Directives  ?Does Patient Have a Medical Advance Directive? No No No No No  ?Would patient like information on creating a medical advance directive? No - Patient declined No - Patient declined No - Patient declined Yes (MAU/Ambulatory/Procedural Areas - Information given) Yes (MAU/Ambulatory/Procedural Areas - Information given)  ? ? ?Current Medications (verified) ?Outpatient Encounter Medications as of 10/09/2021  ?Medication Sig  ? ALLERGY RELIEF 10 MG  tablet TAKE ONE TABLET BY MOUTH EVERY DAY  ? allopurinol (ZYLOPRIM) 300 MG tablet Take 1 tablet (300 mg total) by mouth daily.  ? carbidopa-levodopa (SINEMET CR) 50-200 MG tablet Take by mouth.  ? cholecalciferol (VITAMIN D3) 25 MCG (1000 UNIT) tablet Take 1,000 Units by mouth daily.  ? gabapentin (NEURONTIN) 600 MG tablet Take 1 tablet (600 mg total) by mouth 3 (three) times daily.  ? HYDROcodone-acetaminophen (NORCO) 5-325 MG tablet Take 1 tablet by mouth every 12 (twelve) hours as needed for moderate pain.  ? levothyroxine (SYNTHROID) 112 MCG tablet Take 1 tablet (112 mcg total) by mouth daily.  ? mometasone (NASONEX) 50 MCG/ACT nasal spray INSTILL TWO SPRAYS INTO EACH NOSTRIL DAILY  ? montelukast (SINGULAIR) 10 MG tablet TAKE ONE TABLET BY MOUTH AT BEDTIME  ? Omega-3 Fatty Acids (OMEGA-3 FISH OIL PO) Take 1 capsule by mouth daily.   ? potassium chloride SA (KLOR-CON M) 20 MEQ tablet Take by mouth.  ? simvastatin (ZOCOR) 20 MG tablet Take 1 tablet (20 mg total) by mouth 1 day or 1 dose for 360 doses.  ? torsemide (DEMADEX) 20 MG tablet Take 80 mg by mouth daily.  ? HYDROcodone-acetaminophen (NORCO) 5-325 MG tablet Take 1 tablet by mouth every 12 (twelve) hours as needed for moderate pain.  ? HYDROcodone-acetaminophen (NORCO/VICODIN) 5-325 MG tablet Take 1 tablet by mouth every 12 (twelve) hours as needed for moderate pain.  ? [DISCONTINUED] furosemide (LASIX) 20 MG tablet Take 1 tablet (20 mg total) by mouth 2 (two) times daily.  ? ?No facility-administered encounter medications on file as of 10/09/2021.  ? ? ?Allergies (verified) ?Codeine  and Penicillins  ? ?History: ?Past Medical History:  ?Diagnosis Date  ? Arthritis   ? Asthma   ? Back pain   ? Family history of adverse reaction to anesthesia   ? Gout   ? Hyperlipidemia   ? Hypertension   ? Hypothyroidism   ? Insomnia   ? Joint pain   ? Neuropathy   ? Sleep apnea   ? not bad enought to do anyting about  ? Thyroid disease   ? ?Past Surgical History:  ?Procedure  Laterality Date  ? COLONOSCOPY WITH PROPOFOL N/A 11/05/2019  ? Procedure: COLONOSCOPY WITH PROPOFOL;  Surgeon: Daneil Dolin, MD;  [facility];  Service: Endoscopy;  Laterality: N/A;  12:30pm  ? none    ? POLYPECTOMY  11/05/2019  ? Procedure: POLYPECTOMY;  Surgeon: Daneil Dolin, MD;  Location: [facility];  Service: Endoscopy;;  ? ?Family History  ?Problem Relation Age of Onset  ? Heart disease Mother   ? Heart disease Father   ? Hypertension Brother   ? Hyperlipidemia Brother   ? Heart disease Sister   ? Heart disease Brother   ? Heart disease Brother   ? Cancer Brother   ? Colon cancer Neg Hx   ? Liver disease Neg Hx   ? ?Social History  ? ?Socioeconomic History  ? Marital status: Divorced  ?  Spouse name: Not on file  ? Number of children: 3  ? Years of education: 8   ? Highest education level: 8th grade  ?Occupational History  ? Occupation: Retired  ?Tobacco Use  ? Smoking status: Former  ?  Packs/day: 2.00  ?  Years: 25.00  ?  Pack years: 50.00  ?  Types: Cigarettes  ?  Quit date: 07/06/1995  ?  Years since quitting: 26.2  ? Smokeless tobacco: Current  ?  Types: Chew  ?Vaping Use  ? Vaping Use: Never used  ?Substance and Sexual Activity  ? Alcohol use: Not Currently  ?  Comment: stopped 3 months ago  ? Drug use: No  ? Sexual activity: Yes  ?Other Topics Concern  ? Not on file  ?Social History Narrative  ? Roommate 86 years old  ? Spends lots of time with brother and children  ? ?Social Determinants of Health  ? ?Financial Resource Strain: Low Risk   ? Difficulty of Paying Living Expenses: Not hard at all  ?Food Insecurity: No Food Insecurity  ? Worried About Charity fundraiser in the Last Year: Never true  ? Ran Out of Food in the Last Year: Never true  ?Transportation Needs: No Transportation Needs  ? Lack of Transportation (Medical): No  ? Lack of Transportation (Non-Medical): No  ?Physical Activity: Insufficiently Active  ? Days of Exercise per Week: 7 days  ? Minutes of Exercise per  Session: 20 min  ?Stress: No Stress Concern Present  ? Feeling of Stress : Only a little  ?Social Connections: Socially Isolated  ? Frequency of Communication with Friends and Family: More than three times a week  ? Frequency of Social Gatherings with Friends and Family: More than three times a week  ? Attends Religious Services: Never  ? Active Member of Clubs or Organizations: No  ? Attends Archivist Meetings: Never  ? Marital Status: Divorced  ? ? ?Tobacco Counseling ?Ready to quit: Not Answered ?Counseling given: Not Answered ? ? ?Clinical Intake: ? ?Pre-visit preparation completed: Yes ? ?Pain : 0-10 ?Pain Score: 8  ?Pain  Type: Neuropathic pain ?Pain Location: Foot ?Pain Orientation: Right, Left ?Pain Descriptors / Indicators: Burning, Pins and needles, Aching, Tender ?Pain Onset: More than a month ago ?Pain Frequency: Intermittent ? ?  ? ?BMI - recorded: 36.4 ?Nutritional Status: BMI > 30  Obese ?Nutritional Risks: None ?Diabetes: No ? ?How often do you need to have someone help you when you read instructions, pamphlets, or other written materials from your doctor or pharmacy?: 1 - Never ? ?Diabetic? no ? ?Interpreter Needed?: No ? ?Information entered by :: Haisley Arens, LPN ? ? ?Activities of Daily Living ? ?  10/09/2021  ?  3:37 PM  ?In your present state of health, do you have any difficulty performing the following activities:  ?Hearing? 1  ?Comment checking into getting hearing aids soon  ?Vision? 0  ?Difficulty concentrating or making decisions? 0  ?Walking or climbing stairs? 0  ?Dressing or bathing? 0  ?Doing errands, shopping? 0  ?Preparing Food and eating ? N  ?Using the Toilet? N  ?In the past six months, have you accidently leaked urine? N  ?Do you have problems with loss of bowel control? N  ?Managing your Medications? N  ?Managing your Finances? N  ?Housekeeping or managing your Housekeeping? N  ? ? ?Patient Care Team: ?Sharion Balloon, FNP as PCP - General (Family Medicine) ?Rourk,  Cristopher Estimable, MD as Consulting Physician (Gastroenterology) ?Liana Gerold, MD as Consulting Physician (Nephrology) ?Celestia Khat, OD (Optometry) ? ?Indicate any recent Medical Services you may have received f

## 2021-10-09 NOTE — Patient Instructions (Addendum)
[name] , ?Thank you for taking time to come for your Medicare Wellness Visit. I appreciate your ongoing commitment to your health goals. Please review the following plan we discussed and let me know if I can assist you in the future.  ? ?Screening recommendations/referrals: ?Colonoscopy: Done 11/05/2019 - Repeat in 3 years with Dr Gala Romney ?Recommended yearly ophthalmology/optometry visit for glaucoma screening and checkup ?Recommended yearly dental visit for hygiene and checkup ? ?Vaccinations: ?Influenza vaccine: Done 04/07/2021 - Repeat annually ?Pneumococcal vaccine: Done 07/05/2013, 12/11/2014 & 11/18/2016 ?Tdap vaccine: Done 10/11/2013 - Repeat in 10 years ?Shingles vaccine: Done 06/04/2020 & 01/01/2021 ?Covid-19: Done  3/81/2021, 10/04/2019, & 06/03/2020 - for additional boosters, contact pharmacy ? ?Advanced directives: Advance directive discussed with you today. Even though you declined this today, please call our office should you change your mind, and we can give you the proper paperwork for you to fill out.  ? ?Conditions/risks identified: Aim for 30 minutes of exercise or brisk walking, 6-8 glasses of water, and 5 servings of fruits and vegetables each day.  ? ?Next appointment: Follow up in one year for your annual wellness visit.  ? ?Preventive Care 13 Years and Older, Male ? ?Preventive care refers to lifestyle choices and visits with your health care provider that can promote health and wellness. ?What does preventive care include? ?A yearly physical exam. This is also called an annual well check. ?Dental exams once or twice a year. ?Routine eye exams. Ask your health care provider how often you should have your eyes checked. ?Personal lifestyle choices, including: ?Daily care of your teeth and gums. ?Regular physical activity. ?Eating a healthy diet. ?Avoiding tobacco and drug use. ?Limiting alcohol use. ?Practicing safe sex. ?Taking low doses of aspirin every day. ?Taking vitamin and mineral  supplements as recommended by your health care provider. ?What happens during an annual well check? ?The services and screenings done by your health care provider during your annual well check will depend on your age, overall health, lifestyle risk factors, and family history of disease. ?Counseling  ?Your health care provider may ask you questions about your: ?Alcohol use. ?Tobacco use. ?Drug use. ?Emotional well-being. ?Home and relationship well-being. ?Sexual activity. ?Eating habits. ?History of falls. ?Memory and ability to understand (cognition). ?Work and work Statistician. ?Screening  ?You may have the following tests or measurements: ?Height, weight, and BMI. ?Blood pressure. ?Lipid and cholesterol levels. These may be checked every 5 years, or more frequently if you are over 95 years old. ?Skin check. ?Lung cancer screening. You may have this screening every year starting at age 68 if you have a 30-pack-year history of smoking and currently smoke or have quit within the past 15 years. ?Fecal occult blood test (FOBT) of the stool. You may have this test every year starting at age 42. ?Flexible sigmoidoscopy or colonoscopy. You may have a sigmoidoscopy every 5 years or a colonoscopy every 10 years starting at age 40. ?Prostate cancer screening. Recommendations will vary depending on your family history and other risks. ?Hepatitis C blood test. ?Hepatitis B blood test. ?Sexually transmitted disease (STD) testing. ?Diabetes screening. This is done by checking your blood sugar (glucose) after you have not eaten for a while (fasting). You may have this done every 1-3 years. ?Abdominal aortic aneurysm (AAA) screening. You may need this if you are a current or former smoker. ?Osteoporosis. You may be screened starting at age 29 if you are at high risk. ?Talk with your health care provider about your  test results, treatment options, and if necessary, the need for more tests. ?Vaccines  ?Your health care provider  may recommend certain vaccines, such as: ?Influenza vaccine. This is recommended every year. ?Tetanus, diphtheria, and acellular pertussis (Tdap, Td) vaccine. You may need a Td booster every 10 years. ?Zoster vaccine. You may need this after age 10. ?Pneumococcal 13-valent conjugate (PCV13) vaccine. One dose is recommended after age 1. ?Pneumococcal polysaccharide (PPSV23) vaccine. One dose is recommended after age 64. ?Talk to your health care provider about which screenings and vaccines you need and how often you need them. ?This information is not intended to replace advice given to you by your health care provider. Make sure you discuss any questions you have with your health care provider. ?Document Released: 07/04/2015 Document Revised: 02/25/2016 Document Reviewed: 04/08/2015 ?Elsevier Interactive Patient Education ? 2017 [place]. ? ?Fall Prevention in the Home ?Falls can cause injuries. They can happen to people of all ages. There are many things you can do to make your home safe and to help prevent falls. ?What can I do on the outside of my home? ?Regularly fix the edges of walkways and driveways and fix any cracks. ?Remove anything that might make you trip as you walk through a door, such as a raised step or threshold. ?Trim any bushes or trees on the path to your home. ?Use bright outdoor lighting. ?Clear any walking paths of anything that might make someone trip, such as rocks or tools. ?Regularly check to see if handrails are loose or broken. Make sure that both sides of any steps have handrails. ?Any raised decks and porches should have guardrails on the edges. ?Have any leaves, snow, or ice cleared regularly. ?Use sand or salt on walking paths during winter. ?Clean up any spills in your garage right away. This includes oil or grease spills. ?What can I do in the bathroom? ?Use night lights. ?Install grab bars by the toilet and in the tub and shower. Do not use towel bars as grab bars. ?Use  non-skid mats or decals in the tub or shower. ?If you need to sit down in the shower, use a plastic, non-slip stool. ?Keep the floor dry. Clean up any water that spills on the floor as soon as it happens. ?Remove soap buildup in the tub or shower regularly. ?Attach bath mats securely with double-sided non-slip rug tape. ?Do not have throw rugs and other things on the floor that can make you trip. ?What can I do in the bedroom? ?Use night lights. ?Make sure that you have a light by your bed that is easy to reach. ?Do not use any sheets or blankets that are too big for your bed. They should not hang down onto the floor. ?Have a firm chair that has side arms. You can use this for support while you get dressed. ?Do not have throw rugs and other things on the floor that can make you trip. ?What can I do in the kitchen? ?Clean up any spills right away. ?Avoid walking on wet floors. ?Keep items that you use a lot in easy-to-reach places. ?If you need to reach something above you, use a strong step stool that has a grab bar. ?Keep electrical cords out of the way. ?Do not use floor polish or wax that makes floors slippery. If you must use wax, use non-skid floor wax. ?Do not have throw rugs and other things on the floor that can make you trip. ?What can I do with my  stairs? ?Do not leave any items on the stairs. ?Make sure that there are handrails on both sides of the stairs and use them. Fix handrails that are broken or loose. Make sure that handrails are as long as the stairways. ?Check any carpeting to make sure that it is firmly attached to the stairs. Fix any carpet that is loose or worn. ?Avoid having throw rugs at the top or bottom of the stairs. If you do have throw rugs, attach them to the floor with carpet tape. ?Make sure that you have a light switch at the top of the stairs and the bottom of the stairs. If you do not have them, ask someone to add them for you. ?What else can I do to help prevent falls? ?Wear  shoes that: ?Do not have high heels. ?Have rubber bottoms. ?Are comfortable and fit you well. ?Are closed at the toe. Do not wear sandals. ?If you use a stepladder: ?Make sure that it is fully opened. Do not climb a close

## 2021-10-13 ENCOUNTER — Other Ambulatory Visit: Payer: Self-pay | Admitting: Family

## 2021-10-13 DIAGNOSIS — J45909 Unspecified asthma, uncomplicated: Secondary | ICD-10-CM

## 2021-10-13 LAB — TOXASSURE SELECT 13 (MW), URINE

## 2021-10-14 DIAGNOSIS — N1832 Chronic kidney disease, stage 3b: Secondary | ICD-10-CM | POA: Diagnosis not present

## 2021-10-14 DIAGNOSIS — R600 Localized edema: Secondary | ICD-10-CM | POA: Diagnosis not present

## 2021-10-14 DIAGNOSIS — I129 Hypertensive chronic kidney disease with stage 1 through stage 4 chronic kidney disease, or unspecified chronic kidney disease: Secondary | ICD-10-CM | POA: Diagnosis not present

## 2021-10-14 DIAGNOSIS — E876 Hypokalemia: Secondary | ICD-10-CM | POA: Diagnosis not present

## 2021-10-14 DIAGNOSIS — R7303 Prediabetes: Secondary | ICD-10-CM | POA: Diagnosis not present

## 2021-10-14 DIAGNOSIS — E211 Secondary hyperparathyroidism, not elsewhere classified: Secondary | ICD-10-CM | POA: Diagnosis not present

## 2021-10-19 ENCOUNTER — Other Ambulatory Visit: Payer: Self-pay | Admitting: Family

## 2021-10-19 DIAGNOSIS — J302 Other seasonal allergic rhinitis: Secondary | ICD-10-CM

## 2021-11-12 ENCOUNTER — Other Ambulatory Visit: Payer: Self-pay | Admitting: Family

## 2021-11-12 DIAGNOSIS — J302 Other seasonal allergic rhinitis: Secondary | ICD-10-CM

## 2021-11-18 ENCOUNTER — Other Ambulatory Visit: Payer: Self-pay | Admitting: Family

## 2021-11-18 DIAGNOSIS — G6289 Other specified polyneuropathies: Secondary | ICD-10-CM

## 2021-12-14 ENCOUNTER — Other Ambulatory Visit: Payer: Self-pay | Admitting: Family

## 2021-12-14 DIAGNOSIS — G6289 Other specified polyneuropathies: Secondary | ICD-10-CM

## 2021-12-18 DIAGNOSIS — N1832 Chronic kidney disease, stage 3b: Secondary | ICD-10-CM | POA: Diagnosis not present

## 2021-12-18 DIAGNOSIS — E211 Secondary hyperparathyroidism, not elsewhere classified: Secondary | ICD-10-CM | POA: Diagnosis not present

## 2021-12-18 DIAGNOSIS — I129 Hypertensive chronic kidney disease with stage 1 through stage 4 chronic kidney disease, or unspecified chronic kidney disease: Secondary | ICD-10-CM | POA: Diagnosis not present

## 2021-12-18 DIAGNOSIS — E876 Hypokalemia: Secondary | ICD-10-CM | POA: Diagnosis not present

## 2021-12-18 DIAGNOSIS — R7303 Prediabetes: Secondary | ICD-10-CM | POA: Diagnosis not present

## 2021-12-24 DIAGNOSIS — E211 Secondary hyperparathyroidism, not elsewhere classified: Secondary | ICD-10-CM | POA: Diagnosis not present

## 2021-12-24 DIAGNOSIS — E876 Hypokalemia: Secondary | ICD-10-CM | POA: Diagnosis not present

## 2021-12-24 DIAGNOSIS — I129 Hypertensive chronic kidney disease with stage 1 through stage 4 chronic kidney disease, or unspecified chronic kidney disease: Secondary | ICD-10-CM | POA: Diagnosis not present

## 2021-12-24 DIAGNOSIS — N1832 Chronic kidney disease, stage 3b: Secondary | ICD-10-CM | POA: Diagnosis not present

## 2022-01-07 ENCOUNTER — Ambulatory Visit (INDEPENDENT_AMBULATORY_CARE_PROVIDER_SITE_OTHER): Payer: Medicare Other | Admitting: Family

## 2022-01-07 ENCOUNTER — Encounter: Payer: Self-pay | Admitting: Family

## 2022-01-07 VITALS — BP 94/57 | HR 66 | Temp 98.0°F | Ht 71.0 in | Wt 272.0 lb

## 2022-01-07 DIAGNOSIS — N1832 Chronic kidney disease, stage 3b: Secondary | ICD-10-CM

## 2022-01-07 DIAGNOSIS — D649 Anemia, unspecified: Secondary | ICD-10-CM

## 2022-01-07 DIAGNOSIS — I129 Hypertensive chronic kidney disease with stage 1 through stage 4 chronic kidney disease, or unspecified chronic kidney disease: Secondary | ICD-10-CM | POA: Diagnosis not present

## 2022-01-07 DIAGNOSIS — M545 Low back pain, unspecified: Secondary | ICD-10-CM

## 2022-01-07 DIAGNOSIS — G4700 Insomnia, unspecified: Secondary | ICD-10-CM

## 2022-01-07 DIAGNOSIS — E785 Hyperlipidemia, unspecified: Secondary | ICD-10-CM | POA: Diagnosis not present

## 2022-01-07 DIAGNOSIS — E039 Hypothyroidism, unspecified: Secondary | ICD-10-CM | POA: Diagnosis not present

## 2022-01-07 DIAGNOSIS — I10 Essential (primary) hypertension: Secondary | ICD-10-CM | POA: Diagnosis not present

## 2022-01-07 DIAGNOSIS — G8929 Other chronic pain: Secondary | ICD-10-CM | POA: Diagnosis not present

## 2022-01-07 DIAGNOSIS — Z0289 Encounter for other administrative examinations: Secondary | ICD-10-CM

## 2022-01-07 DIAGNOSIS — J45909 Unspecified asthma, uncomplicated: Secondary | ICD-10-CM | POA: Diagnosis not present

## 2022-01-07 DIAGNOSIS — F1120 Opioid dependence, uncomplicated: Secondary | ICD-10-CM

## 2022-01-07 DIAGNOSIS — M1990 Unspecified osteoarthritis, unspecified site: Secondary | ICD-10-CM | POA: Diagnosis not present

## 2022-01-07 DIAGNOSIS — E211 Secondary hyperparathyroidism, not elsewhere classified: Secondary | ICD-10-CM | POA: Diagnosis not present

## 2022-01-07 MED ORDER — HYDROCODONE-ACETAMINOPHEN 5-325 MG PO TABS: 1.0000 | ORAL_TABLET | Freq: Two times a day (BID) | ORAL | 0 refills | Status: DC | PRN

## 2022-01-07 MED ORDER — VALSARTAN 40 MG PO TABS: 20.0000 mg | ORAL_TABLET | Freq: Every day | ORAL | 1 refills | Status: DC

## 2022-01-07 NOTE — Patient Instructions (Signed)
Peripheral Edema  Peripheral edema is swelling that is caused by a buildup of fluid. Peripheral edema most often affects the lower legs, ankles, and feet. It can also develop in the arms, hands, and face. The area of the body that has peripheral edema will look swollen. It may also feel heavy or warm. Your clothes may start to feel tight. Pressing on the area may make a temporary dent in your skin (pitting edema). You may not be able to move your swollen arm or leg as much as usual. There are many causes of peripheral edema. It can happen because of a complication of other conditions such as heart failure, kidney disease, or a problem with your circulation. It also can be a side effect of certain medicines or happen because of an infection. It often happens to women during pregnancy. Sometimes, the cause is not known. Follow these instructions at home: Managing pain, stiffness, and swelling  Raise (elevate) your legs while you are sitting or lying down. Move around often to prevent stiffness and to reduce swelling. Do not sit or stand for long periods of time. Do not wear tight clothing. Do not wear garters on your upper legs. Exercise your legs to get your circulation going. This helps to move the fluid back into your blood vessels, and it may help the swelling go down. Wear compression stockings as told by your health care provider. These stockings help to prevent blood clots and reduce swelling in your legs. It is important that these are the correct size. These stockings should be prescribed by your doctor to prevent possible injuries. If elastic bandages or wraps are recommended, use them as told by your health care provider. Medicines Take over-the-counter and prescription medicines only as told by your health care provider. Your health care provider may prescribe medicine to help your body get rid of excess water (diuretic). Take this medicine if you are told to take it. General  instructions Eat a low-salt (low-sodium) diet as told by your health care provider. Sometimes, eating less salt may reduce swelling. Pay attention to any changes in your symptoms. Moisturize your skin daily to help prevent skin from cracking and draining. Keep all follow-up visits. This is important. Contact a health care provider if: You have a fever. You have swelling in only one leg. You have increased swelling, redness, or pain in one or both of your legs. You have drainage or sores at the area where you have edema. Get help right away if: You have edema that starts suddenly or is getting worse, especially if you are pregnant or have a medical condition. You develop shortness of breath, especially when you are lying down. You have pain in your chest or abdomen. You feel weak. You feel like you will faint. These symptoms may be an emergency. Get help right away. Call 911. Do not wait to see if the symptoms will go away. Do not drive yourself to the hospital. Summary Peripheral edema is swelling that is caused by a buildup of fluid. Peripheral edema most often affects the lower legs, ankles, and feet. Move around often to prevent stiffness and to reduce swelling. Do not sit or stand for long periods of time. Pay attention to any changes in your symptoms. Contact a health care provider if you have edema that starts suddenly or is getting worse, especially if you are pregnant or have a medical condition. Get help right away if you develop shortness of breath, especially when lying down.   This information is not intended to replace advice given to you by your health care provider. Make sure you discuss any questions you have with your health care provider. Document Revised: 02/09/2021 Document Reviewed: 02/09/2021 Elsevier Patient Education  2023 Else[employer] Inc.  

## 2022-01-07 NOTE — Progress Notes (Signed)
Subjective:    Patient ID: Andrew Phelps, male    DOB: 1945-09-18, 76 y.o.   MRN: [id number]  Chief Complaint  Patient presents with   Medical Management of Chronic Issues   PT presents the office today for chronic follow up and pain medication refill. PT is followed by Nephrologists every 2 months for CKD.  He has peripheral edema.   He is considered morbid obese with a BMI of 37 and co morbidity of HTN and CKD.   Hypertension This is a chronic problem. The current episode started more than 1 year ago. The problem has been resolved since onset. The problem is controlled. Associated symptoms include peripheral edema. Pertinent negatives include no malaise/fatigue or shortness of breath. Risk factors for coronary artery disease include dyslipidemia, obesity, male gender and sedentary lifestyle. The current treatment provides moderate improvement. Identifiable causes of hypertension include a thyroid problem.  Asthma There is no cough, hoarse voice, shortness of breath or wheezing. This is a chronic problem. The current episode started more than 1 year ago. The problem occurs intermittently. The problem has been waxing and waning. Pertinent negatives include no malaise/fatigue. His symptoms are alleviated by rest. His symptoms are not alleviated by rest. His past medical history is significant for asthma.  Thyroid Problem Presents for follow-up visit. Patient reports no anxiety, constipation, dry skin, fatigue or hoarse voice. The symptoms have been stable. His past medical history is significant for hyperlipidemia.  Arthritis Presents for follow-up visit. He complains of pain and stiffness. The symptoms have been stable. Affected locations include the right knee and left knee. His pain is at a severity of 9/10. Pertinent negatives include no fatigue.  Insomnia Primary symptoms: difficulty falling asleep, frequent awakening, no malaise/fatigue.   The current episode started more than  one year. The onset quality is gradual. The problem occurs intermittently.  Hyperlipidemia This is a chronic problem. The current episode started more than 1 year ago. Exacerbating diseases include obesity. Pertinent negatives include no shortness of breath. Current antihyperlipidemic treatment includes statins. The current treatment provides moderate improvement of lipids. Risk factors for coronary artery disease include dyslipidemia, male sex, hypertension and a sedentary lifestyle.  Back Pain This is a chronic problem. The current episode started more than 1 year ago. The problem occurs intermittently. The pain is present in the lumbar spine. The quality of the pain is described as aching. The pain is moderate. Risk factors include obesity. The treatment provided mild relief.  Anemia Presents for follow-up visit. There has been no malaise/fatigue.    Current opioids rx- Norco 5-325 mg # meds rx- 60 Effectiveness of current meds-stable Adverse reactions from pain meds-none Morphine equivalent- 10   Pill count performed-No Last drug screen - today ( high risk q75m moderate risk q672mlow risk yearly ) Urine drug screen today- Yes [place]             If yes were their any concerning findings? - none   Pain contract signed on: 10/08/21  Review of Systems  Constitutional:  Negative for fatigue and malaise/fatigue.  HENT:  Negative for hoarse voice.   Respiratory:  Negative for cough, shortness of breath and wheezing.   Gastrointestinal:  Negative for constipation.  Musculoskeletal:  Positive for arthritis, back pain and stiffness.  Psychiatric/Behavioral:  The patient has insomnia. The patient is not nervous/anxious.   All other systems reviewed and are negative.      Objective:   Physical Exam  Vitals reviewed.  Constitutional:      General: He is not in acute distress.    Appearance: He is well-developed. He is obese.  HENT:     Head: Normocephalic.      Right Ear: Tympanic membrane normal.     Left Ear: Tympanic membrane normal.  Eyes:     General:        Right eye: No discharge.        Left eye: No discharge.     Pupils: Pupils are equal, round, and reactive to light.  Neck:     Thyroid: No thyromegaly.  Cardiovascular:     Rate and Rhythm: Normal rate and regular rhythm.     Heart sounds: Normal heart sounds. No murmur heard. Pulmonary:     Effort: Pulmonary effort is normal. No respiratory distress.     Breath sounds: Normal breath sounds. No wheezing.  Abdominal:     General: Bowel sounds are normal. There is no distension.     Palpations: Abdomen is soft.     Tenderness: There is no abdominal tenderness.  Musculoskeletal:        General: No tenderness. Normal range of motion.     Cervical back: Normal range of motion and neck supple.  Skin:    General: Skin is warm and dry.     Findings: No erythema or rash.  Neurological:     Mental Status: He is alert and oriented to person, place, and time.     Cranial Nerves: No cranial nerve deficit.     Deep Tendon Reflexes: Reflexes are normal and symmetric.  Psychiatric:        Behavior: Behavior normal.        Thought Content: Thought content normal.        Judgment: Judgment normal.          BP (!) 100/55   Pulse 66   Temp 98 F (36.7 C)   Ht 5' 11" (1.803 m)   Wt 272 lb (123.4 kg)   SpO2 96%   BMI 37.94 kg/m   Assessment & Plan:  Andrew Phelps comes in today with chief complaint of Medical Management of Chronic Issues   Diagnosis and orders addressed:  1. Essential hypertension Given hypotension will decrease valsartan to 20 mg from 40 mg  - CMP14+EGFR - CBC with Differential/Platelet - valsartan (DIOVAN) 40 MG tablet; Take 0.5 tablets (20 mg total) by mouth daily.  Dispense: 45 tablet; Refill: 1  2. Chronic asthma without complication, unspecified asthma severity, unspecified whether persistent - CMP14+EGFR - CBC with  Differential/Platelet  3. Hypothyroidism, unspecified type - CMP14+EGFR - CBC with Differential/Platelet - TSH  4. Arthritis - HYDROcodone-acetaminophen (NORCO) 5-325 MG tablet; Take 1 tablet by mouth every 12 (twelve) hours as needed for moderate pain.  Dispense: 60 tablet; Refill: 0 - HYDROcodone-acetaminophen (NORCO) 5-325 MG tablet; Take 1 tablet by mouth every 12 (twelve) hours as needed for moderate pain.  Dispense: 60 tablet; Refill: 0 - HYDROcodone-acetaminophen (NORCO/VICODIN) 5-325 MG tablet; Take 1 tablet by mouth every 12 (twelve) hours as needed for moderate pain.  Dispense: 60 tablet; Refill: 0 - CMP14+EGFR - CBC with Differential/Platelet  5. Stage 3b chronic kidney disease ([place]) - CMP14+EGFR - CBC with Differential/Platelet  6. Insomnia, unspecified type - CMP14+EGFR - CBC with Differential/Platelet  7. Hyperlipidemia, unspecified hyperlipidemia type - CMP14+EGFR - CBC with Differential/Platelet  8. Pain medication agreement signed - HYDROcodone-acetaminophen (NORCO) 5-325 MG tablet; Take 1 tablet by mouth every  12 (twelve) hours as needed for moderate pain.  Dispense: 60 tablet; Refill: 0 - HYDROcodone-acetaminophen (NORCO) 5-325 MG tablet; Take 1 tablet by mouth every 12 (twelve) hours as needed for moderate pain.  Dispense: 60 tablet; Refill: 0 - HYDROcodone-acetaminophen (NORCO/VICODIN) 5-325 MG tablet; Take 1 tablet by mouth every 12 (twelve) hours as needed for moderate pain.  Dispense: 60 tablet; Refill: 0 - CMP14+EGFR - CBC with Differential/Platelet  9. Chronic bilateral low back pain, unspecified whether sciatica present - CMP14+EGFR - CBC with Differential/Platelet  10. Morbid obesity ([place]) - CMP14+EGFR - CBC with Differential/Platelet  11. Uncomplicated opioid dependence (HCC) - HYDROcodone-acetaminophen (NORCO) 5-325 MG tablet; Take 1 tablet by mouth every 12 (twelve) hours as needed for moderate pain.  Dispense: 60 tablet; Refill: 0 -  HYDROcodone-acetaminophen (NORCO) 5-325 MG tablet; Take 1 tablet by mouth every 12 (twelve) hours as needed for moderate pain.  Dispense: 60 tablet; Refill: 0 - HYDROcodone-acetaminophen (NORCO/VICODIN) 5-325 MG tablet; Take 1 tablet by mouth every 12 (twelve) hours as needed for moderate pain.  Dispense: 60 tablet; Refill: 0 - CMP14+EGFR - CBC with Differential/Platelet  12. Anemia, unspecified type - CMP14+EGFR - CBC with Differential/Platelet  13. Chronic bilateral low back pain without sciatica - HYDROcodone-acetaminophen (NORCO) 5-325 MG tablet; Take 1 tablet by mouth every 12 (twelve) hours as needed for moderate pain.  Dispense: 60 tablet; Refill: 0 - HYDROcodone-acetaminophen (NORCO) 5-325 MG tablet; Take 1 tablet by mouth every 12 (twelve) hours as needed for moderate pain.  Dispense: 60 tablet; Refill: 0 - HYDROcodone-acetaminophen (NORCO/VICODIN) 5-325 MG tablet; Take 1 tablet by mouth every 12 (twelve) hours as needed for moderate pain.  Dispense: 60 tablet; Refill: 0 - CMP14+EGFR - CBC with Differential/Platelet   Labs pending Patient reviewed in [place] controlled database, no flags noted. Contract and drug screen are up to date.  Health Maintenance reviewed Diet and exercise encouraged  Follow up plan: 3 months    Evelina Dun, FNP

## 2022-01-08 LAB — CBC WITH DIFFERENTIAL/PLATELET
Basophils Absolute: 0.1 10*3/uL (ref 0.0–0.2)
Basos: 1 %
EOS (ABSOLUTE): 0.3 10*3/uL (ref 0.0–0.4)
Eos: 4 %
Hematocrit: 42.2 % (ref 37.5–51.0)
Hemoglobin: 13.8 g/dL (ref 13.0–17.7)
Immature Grans (Abs): 0 10*3/uL (ref 0.0–0.1)
Immature Granulocytes: 0 %
Lymphocytes Absolute: 1.9 10*3/uL (ref 0.7–3.1)
Lymphs: 23 %
MCH: 29.9 pg (ref 26.6–33.0)
MCHC: 32.7 g/dL (ref 31.5–35.7)
MCV: 91 fL (ref 79–97)
Monocytes Absolute: 0.5 10*3/uL (ref 0.1–0.9)
Monocytes: 6 %
Neutrophils Absolute: 5.4 10*3/uL (ref 1.4–7.0)
Neutrophils: 66 %
Platelets: 176 10*3/uL (ref 150–450)
RBC: 4.62 x10E6/uL (ref 4.14–5.80)
RDW: 14.6 % (ref 11.6–15.4)
WBC: 8.2 10*3/uL (ref 3.4–10.8)

## 2022-01-08 LAB — CMP14+EGFR
ALT: 13 IU/L (ref 0–44)
AST: 21 IU/L (ref 0–40)
Albumin/Globulin Ratio: 1.4 (ref 1.2–2.2)
Albumin: 4 g/dL (ref 3.8–4.8)
Alkaline Phosphatase: 92 IU/L (ref 44–121)
BUN/Creatinine Ratio: 13 (ref 10–24)
BUN: 24 mg/dL (ref 8–27)
Bilirubin Total: 1.2 mg/dL (ref 0.0–1.2)
CO2: 27 mmol/L (ref 20–29)
Calcium: 9.2 mg/dL (ref 8.6–10.2)
Chloride: 97 mmol/L (ref 96–106)
Creatinine, Ser: 1.91 mg/dL — ABNORMAL HIGH (ref 0.76–1.27)
Globulin, Total: 2.9 g/dL (ref 1.5–4.5)
Glucose: 89 mg/dL (ref 70–99)
Potassium: 4.1 mmol/L (ref 3.5–5.2)
Sodium: 140 mmol/L (ref 134–144)
Total Protein: 6.9 g/dL (ref 6.0–8.5)
eGFR: 36 mL/min/{1.73_m2} — ABNORMAL LOW (ref >59)

## 2022-01-08 LAB — TSH: TSH: 1.61 u[IU]/mL (ref 0.450–4.500)

## 2022-01-14 ENCOUNTER — Other Ambulatory Visit: Payer: Self-pay | Admitting: Family

## 2022-01-14 DIAGNOSIS — J45909 Unspecified asthma, uncomplicated: Secondary | ICD-10-CM

## 2022-01-14 DIAGNOSIS — J302 Other seasonal allergic rhinitis: Secondary | ICD-10-CM

## 2022-01-14 DIAGNOSIS — G6289 Other specified polyneuropathies: Secondary | ICD-10-CM

## 2022-02-15 ENCOUNTER — Other Ambulatory Visit: Payer: Self-pay | Admitting: Family

## 2022-02-15 DIAGNOSIS — E039 Hypothyroidism, unspecified: Secondary | ICD-10-CM

## 2022-02-15 DIAGNOSIS — G6289 Other specified polyneuropathies: Secondary | ICD-10-CM

## 2022-03-04 DIAGNOSIS — I129 Hypertensive chronic kidney disease with stage 1 through stage 4 chronic kidney disease, or unspecified chronic kidney disease: Secondary | ICD-10-CM | POA: Diagnosis not present

## 2022-03-04 DIAGNOSIS — E211 Secondary hyperparathyroidism, not elsewhere classified: Secondary | ICD-10-CM | POA: Diagnosis not present

## 2022-03-04 DIAGNOSIS — N1832 Chronic kidney disease, stage 3b: Secondary | ICD-10-CM | POA: Diagnosis not present

## 2022-03-11 DIAGNOSIS — I129 Hypertensive chronic kidney disease with stage 1 through stage 4 chronic kidney disease, or unspecified chronic kidney disease: Secondary | ICD-10-CM | POA: Diagnosis not present

## 2022-03-11 DIAGNOSIS — E876 Hypokalemia: Secondary | ICD-10-CM | POA: Diagnosis not present

## 2022-03-11 DIAGNOSIS — N1832 Chronic kidney disease, stage 3b: Secondary | ICD-10-CM | POA: Diagnosis not present

## 2022-03-11 DIAGNOSIS — D638 Anemia in other chronic diseases classified elsewhere: Secondary | ICD-10-CM | POA: Diagnosis not present

## 2022-03-11 DIAGNOSIS — E211 Secondary hyperparathyroidism, not elsewhere classified: Secondary | ICD-10-CM | POA: Diagnosis not present

## 2022-03-11 DIAGNOSIS — R600 Localized edema: Secondary | ICD-10-CM | POA: Diagnosis not present

## 2022-03-16 ENCOUNTER — Other Ambulatory Visit: Payer: Self-pay | Admitting: Family

## 2022-03-16 DIAGNOSIS — G6289 Other specified polyneuropathies: Secondary | ICD-10-CM

## 2022-03-23 ENCOUNTER — Encounter: Payer: Self-pay | Admitting: *Deleted

## 2022-04-09 ENCOUNTER — Ambulatory Visit: Payer: Medicare Other | Admitting: Family

## 2022-04-09 ENCOUNTER — Ambulatory Visit (INDEPENDENT_AMBULATORY_CARE_PROVIDER_SITE_OTHER): Payer: Medicare Other | Admitting: Family

## 2022-04-09 ENCOUNTER — Encounter: Payer: Self-pay | Admitting: Family

## 2022-04-09 VITALS — BP 106/58 | HR 82 | Temp 98.8°F | Ht 71.0 in | Wt 280.0 lb

## 2022-04-09 DIAGNOSIS — M545 Low back pain, unspecified: Secondary | ICD-10-CM | POA: Diagnosis not present

## 2022-04-09 DIAGNOSIS — E039 Hypothyroidism, unspecified: Secondary | ICD-10-CM | POA: Diagnosis not present

## 2022-04-09 DIAGNOSIS — Z23 Encounter for immunization: Secondary | ICD-10-CM

## 2022-04-09 DIAGNOSIS — I10 Essential (primary) hypertension: Secondary | ICD-10-CM

## 2022-04-09 DIAGNOSIS — M1711 Unilateral primary osteoarthritis, right knee: Secondary | ICD-10-CM | POA: Diagnosis not present

## 2022-04-09 DIAGNOSIS — F1120 Opioid dependence, uncomplicated: Secondary | ICD-10-CM | POA: Diagnosis not present

## 2022-04-09 DIAGNOSIS — N1832 Chronic kidney disease, stage 3b: Secondary | ICD-10-CM | POA: Diagnosis not present

## 2022-04-09 DIAGNOSIS — Z0289 Encounter for other administrative examinations: Secondary | ICD-10-CM

## 2022-04-09 DIAGNOSIS — J45909 Unspecified asthma, uncomplicated: Secondary | ICD-10-CM

## 2022-04-09 DIAGNOSIS — E785 Hyperlipidemia, unspecified: Secondary | ICD-10-CM

## 2022-04-09 DIAGNOSIS — M1990 Unspecified osteoarthritis, unspecified site: Secondary | ICD-10-CM

## 2022-04-09 DIAGNOSIS — D649 Anemia, unspecified: Secondary | ICD-10-CM

## 2022-04-09 DIAGNOSIS — G8929 Other chronic pain: Secondary | ICD-10-CM

## 2022-04-09 DIAGNOSIS — G4700 Insomnia, unspecified: Secondary | ICD-10-CM

## 2022-04-09 MED ORDER — HYDROCODONE-ACETAMINOPHEN 5-325 MG PO TABS: 1.0000 | ORAL_TABLET | Freq: Two times a day (BID) | ORAL | 0 refills | Status: DC | PRN

## 2022-04-09 NOTE — Progress Notes (Signed)
Subjective:    Patient ID: Andrew Phelps, male    DOB: [birth year], 76 y.o.   MRN: [id number]  Chief Complaint  Patient presents with   Medical Management of Chronic Issues   PT presents the office today for chronic follow up and pain medication refill. PT is followed by Nephrologists every 2 months for CKD.  He has peripheral edema.   He is considered morbid obese with a BMI of 39 and co morbidity of HTN and CKD.   Hypertension This is a chronic problem. The current episode started more than 1 year ago. The problem has been resolved since onset. The problem is controlled. Associated symptoms include peripheral edema. Pertinent negatives include no malaise/fatigue or shortness of breath. Risk factors for coronary artery disease include dyslipidemia, obesity, male gender and sedentary lifestyle. The current treatment provides moderate improvement. Identifiable causes of hypertension include a thyroid problem.  Asthma He complains of hoarse voice. There is no cough, shortness of breath or wheezing. This is a chronic problem. The current episode started more than 1 month ago. The problem occurs intermittently. Associated symptoms include nasal congestion and rhinorrhea. Pertinent negatives include no malaise/fatigue. His symptoms are not alleviated by rest. His past medical history is significant for asthma.  Thyroid Problem Presents for follow-up visit. Symptoms include hoarse voice. Patient reports no constipation, depressed mood, diarrhea or fatigue. The symptoms have been stable. His past medical history is significant for hyperlipidemia.  Arthritis Presents for follow-up visit. He complains of pain and stiffness. The symptoms have been stable. Affected locations include the right knee, left knee, left MCP, right MCP, left foot and right foot. Pertinent negatives include no diarrhea or fatigue.  Insomnia Primary symptoms: difficulty falling asleep, frequent awakening, no  malaise/fatigue.   The current episode started more than one year. The onset quality is gradual. The problem occurs intermittently.  Hyperlipidemia This is a chronic problem. The current episode started more than 1 year ago. The problem is controlled. Recent lipid tests were reviewed and are normal. Exacerbating diseases include obesity. Pertinent negatives include no shortness of breath. Current antihyperlipidemic treatment includes statins. The current treatment provides moderate improvement of lipids. Risk factors for coronary artery disease include dyslipidemia, male sex, hypertension and a sedentary lifestyle.  Anemia Presents for follow-up visit. There has been no malaise/fatigue.   Current opioids rx- Norco 5-325 mg # meds rx- 60 Effectiveness of current meds-stable Adverse reactions from pain meds-none Morphine equivalent- 10   Pill count performed-No Last drug screen - today ( high risk q57m moderate risk q672mlow risk yearly ) Urine drug screen today- Yes [place]             If yes were their any concerning findings? - none   Pain contract signed on: 10/08/21    Review of Systems  Constitutional:  Negative for fatigue and malaise/fatigue.  HENT:  Positive for hoarse voice and rhinorrhea.   Respiratory:  Negative for cough, shortness of breath and wheezing.   Gastrointestinal:  Negative for constipation and diarrhea.  Musculoskeletal:  Positive for arthritis and stiffness.  Psychiatric/Behavioral:  The patient has insomnia.   All other systems reviewed and are negative.      Objective:   Physical Exam Vitals reviewed.  Constitutional:      General: He is not in acute distress.    Appearance: He is well-developed. He is obese.  HENT:     Head: Normocephalic.     Right Ear:  Tympanic membrane normal.     Left Ear: Tympanic membrane normal.  Eyes:     General:        Right eye: No discharge.        Left eye: No discharge.     Pupils: Pupils are  equal, round, and reactive to light.  Neck:     Thyroid: No thyromegaly.  Cardiovascular:     Rate and Rhythm: Normal rate and regular rhythm.     Heart sounds: Normal heart sounds. No murmur heard. Pulmonary:     Effort: Pulmonary effort is normal. No respiratory distress.     Breath sounds: Normal breath sounds. No wheezing.  Abdominal:     General: Bowel sounds are normal. There is no distension.     Palpations: Abdomen is soft.     Tenderness: There is no abdominal tenderness.  Musculoskeletal:        General: No tenderness. Normal range of motion.     Cervical back: Normal range of motion and neck supple.     Right lower leg: Edema (3+) present.     Left lower leg: Edema (3+) present.  Skin:    General: Skin is warm and dry.     Findings: No erythema or rash.  Neurological:     Mental Status: He is alert and oriented to person, place, and time.     Cranial Nerves: No cranial nerve deficit.     Deep Tendon Reflexes: Reflexes are normal and symmetric.  Psychiatric:        Behavior: Behavior normal.        Thought Content: Thought content normal.        Judgment: Judgment normal.       BP (!) 106/58   Pulse 82   Temp 98.8 F (37.1 C) (Temporal)   Ht '5\' 11"'$  (1.803 m)   Wt 280 lb (127 kg)   BMI 39.05 kg/m      Assessment & Plan:   Andrew Phelps comes in today with chief complaint of Medical Management of Chronic Issues   Diagnosis and orders addressed:  1. Arthritis - HYDROcodone-acetaminophen (NORCO/VICODIN) 5-325 MG tablet; Take 1 tablet by mouth every 12 (twelve) hours as needed for moderate pain.  Dispense: 60 tablet; Refill: 0 - HYDROcodone-acetaminophen (NORCO) 5-325 MG tablet; Take 1 tablet by mouth every 12 (twelve) hours as needed for moderate pain.  Dispense: 60 tablet; Refill: 0 - HYDROcodone-acetaminophen (NORCO) 5-325 MG tablet; Take 1 tablet by mouth every 12 (twelve) hours as needed for moderate pain.  Dispense: 60 tablet; Refill: 0  2.  Pain medication agreement signed - HYDROcodone-acetaminophen (NORCO/VICODIN) 5-325 MG tablet; Take 1 tablet by mouth every 12 (twelve) hours as needed for moderate pain.  Dispense: 60 tablet; Refill: 0 - HYDROcodone-acetaminophen (NORCO) 5-325 MG tablet; Take 1 tablet by mouth every 12 (twelve) hours as needed for moderate pain.  Dispense: 60 tablet; Refill: 0 - HYDROcodone-acetaminophen (NORCO) 5-325 MG tablet; Take 1 tablet by mouth every 12 (twelve) hours as needed for moderate pain.  Dispense: 60 tablet; Refill: 0  3. Uncomplicated opioid dependence (HCC) - HYDROcodone-acetaminophen (NORCO/VICODIN) 5-325 MG tablet; Take 1 tablet by mouth every 12 (twelve) hours as needed for moderate pain.  Dispense: 60 tablet; Refill: 0 - HYDROcodone-acetaminophen (NORCO) 5-325 MG tablet; Take 1 tablet by mouth every 12 (twelve) hours as needed for moderate pain.  Dispense: 60 tablet; Refill: 0 - HYDROcodone-acetaminophen (NORCO) 5-325 MG tablet; Take 1 tablet by mouth every 12 (twelve) hours  as needed for moderate pain.  Dispense: 60 tablet; Refill: 0  4. Chronic bilateral low back pain without sciatica  - HYDROcodone-acetaminophen (NORCO/VICODIN) 5-325 MG tablet; Take 1 tablet by mouth every 12 (twelve) hours as needed for moderate pain.  Dispense: 60 tablet; Refill: 0 - HYDROcodone-acetaminophen (NORCO) 5-325 MG tablet; Take 1 tablet by mouth every 12 (twelve) hours as needed for moderate pain.  Dispense: 60 tablet; Refill: 0 - HYDROcodone-acetaminophen (NORCO) 5-325 MG tablet; Take 1 tablet by mouth every 12 (twelve) hours as needed for moderate pain.  Dispense: 60 tablet; Refill: 0  5. Need for immunization against influenza - Flu Vaccine QUAD High Dose(Fluad)  6. Essential hypertension  7. Hypothyroidism, unspecified type  8. Stage 3b chronic kidney disease ([place])  9. Anemia, unspecified type  10. Unilateral primary osteoarthritis, right knee  11. Morbid obesity ([place])  12. Chronic asthma  without complication, unspecified asthma severity, unspecified whether persistent  13. Hyperlipidemia, unspecified hyperlipidemia type  14. Insomnia, unspecified type   Labs pending Patient reviewed in [place] controlled database, no flags noted. Contract and drug screen are up to date.  Health Maintenance reviewed Diet and exercise encouraged  Follow up plan: 3 months    Evelina Dun, FNP

## 2022-04-09 NOTE — Patient Instructions (Signed)
Health Maintenance After Age 74 After age 74, you are at a higher risk for certain long-term diseases and infections as well as injuries from falls. Falls are a major cause of broken bones and head injuries in people who are older than age 74. Getting regular preventive care can help to keep you healthy and well. Preventive care includes getting regular testing and making lifestyle changes as recommended by your health care provider. Talk with your health care provider about: Which screenings and tests you should have. A screening is a test that checks for a disease when you have no symptoms. A diet and exercise plan that is right for you. What should I know about screenings and tests to prevent falls? Screening and testing are the best ways to find a health problem early. Early diagnosis and treatment give you the best chance of managing medical conditions that are common after age 74. Certain conditions and lifestyle choices may make you more likely to have a fall. Your health care provider may recommend: Regular vision checks. Poor vision and conditions such as cataracts can make you more likely to have a fall. If you wear glasses, make sure to get your prescription updated if your vision changes. Medicine review. Work with your health care provider to regularly review all of the medicines you are taking, including over-the-counter medicines. Ask your health care provider about any side effects that may make you more likely to have a fall. Tell your health care provider if any medicines that you take make you feel dizzy or sleepy. Strength and balance checks. Your health care provider may recommend certain tests to check your strength and balance while standing, walking, or changing positions. Foot health exam. Foot pain and numbness, as well as not wearing proper footwear, can make you more likely to have a fall. Screenings, including: Osteoporosis screening. Osteoporosis is a condition that causes  the bones to get weaker and break more easily. Blood pressure screening. Blood pressure changes and medicines to control blood pressure can make you feel dizzy. Depression screening. You may be more likely to have a fall if you have a fear of falling, feel depressed, or feel unable to do activities that you used to do. Alcohol use screening. Using too much alcohol can affect your balance and may make you more likely to have a fall. Follow these instructions at home: Lifestyle Do not drink alcohol if: Your health care provider tells you not to drink. If you drink alcohol: Limit how much you have to: 0-1 drink a day for women. 0-2 drinks a day for men. Know how much alcohol is in your drink. In the [place] one drink equals one 12 oz bottle of beer (355 mL), one 5 oz glass of wine (148 mL), or one 1 oz glass of hard liquor (44 mL). Do not use any products that contain nicotine or tobacco. These products include cigarettes, chewing tobacco, and vaping devices, such as e-cigarettes. If you need help quitting, ask your health care provider. Activity  Follow a regular exercise program to stay fit. This will help you maintain your balance. Ask your health care provider what types of exercise are appropriate for you. If you need a cane or walker, use it as recommended by your health care provider. Wear supportive shoes that have nonskid soles. Safety  Remove any tripping hazards, such as rugs, cords, and clutter. Install safety equipment such as grab bars in bathrooms and safety rails on stairs. Keep rooms and walkways   well-lit. General instructions Talk with your health care provider about your risks for falling. Tell your health care provider if: You fall. Be sure to tell your health care provider about all falls, even ones that seem minor. You feel dizzy, tiredness (fatigue), or off-balance. Take over-the-counter and prescription medicines only as told by your health care provider. These include  supplements. Eat a healthy diet and maintain a healthy weight. A healthy diet includes low-fat dairy products, low-fat (lean) meats, and fiber from whole grains, beans, and lots of fruits and vegetables. Stay current with your vaccines. Schedule regular health, dental, and eye exams. Summary Having a healthy lifestyle and getting preventive care can help to protect your health and wellness after age 74. Screening and testing are the best way to find a health problem early and help you avoid having a fall. Early diagnosis and treatment give you the best chance for managing medical conditions that are more common for people who are older than age 74. Falls are a major cause of broken bones and head injuries in people who are older than age 74. Take precautions to prevent a fall at home. Work with your health care provider to learn what changes you can make to improve your health and wellness and to prevent falls. This information is not intended to replace advice given to you by your health care provider. Make sure you discuss any questions you have with your health care provider. Document Revised: 10/27/2020 Document Reviewed: 10/27/2020 Elsevier Patient Education  2023 Elsevier Inc.  

## 2022-04-12 ENCOUNTER — Ambulatory Visit: Payer: Medicare Other | Admitting: Family

## 2022-04-15 ENCOUNTER — Other Ambulatory Visit: Payer: Self-pay | Admitting: Family

## 2022-04-15 DIAGNOSIS — I10 Essential (primary) hypertension: Secondary | ICD-10-CM

## 2022-04-15 DIAGNOSIS — J45909 Unspecified asthma, uncomplicated: Secondary | ICD-10-CM

## 2022-04-15 DIAGNOSIS — J302 Other seasonal allergic rhinitis: Secondary | ICD-10-CM

## 2022-04-15 DIAGNOSIS — N1832 Chronic kidney disease, stage 3b: Secondary | ICD-10-CM

## 2022-05-19 DIAGNOSIS — D638 Anemia in other chronic diseases classified elsewhere: Secondary | ICD-10-CM | POA: Diagnosis not present

## 2022-05-19 DIAGNOSIS — E876 Hypokalemia: Secondary | ICD-10-CM | POA: Diagnosis not present

## 2022-05-19 DIAGNOSIS — E211 Secondary hyperparathyroidism, not elsewhere classified: Secondary | ICD-10-CM | POA: Diagnosis not present

## 2022-05-19 DIAGNOSIS — I129 Hypertensive chronic kidney disease with stage 1 through stage 4 chronic kidney disease, or unspecified chronic kidney disease: Secondary | ICD-10-CM | POA: Diagnosis not present

## 2022-05-19 DIAGNOSIS — R600 Localized edema: Secondary | ICD-10-CM | POA: Diagnosis not present

## 2022-05-19 DIAGNOSIS — N1832 Chronic kidney disease, stage 3b: Secondary | ICD-10-CM | POA: Diagnosis not present

## 2022-06-01 DIAGNOSIS — R600 Localized edema: Secondary | ICD-10-CM | POA: Diagnosis not present

## 2022-06-01 DIAGNOSIS — N1832 Chronic kidney disease, stage 3b: Secondary | ICD-10-CM | POA: Diagnosis not present

## 2022-06-01 DIAGNOSIS — I129 Hypertensive chronic kidney disease with stage 1 through stage 4 chronic kidney disease, or unspecified chronic kidney disease: Secondary | ICD-10-CM | POA: Diagnosis not present

## 2022-06-01 DIAGNOSIS — E211 Secondary hyperparathyroidism, not elsewhere classified: Secondary | ICD-10-CM | POA: Diagnosis not present

## 2022-06-08 ENCOUNTER — Other Ambulatory Visit: Payer: Self-pay | Admitting: Family

## 2022-06-08 DIAGNOSIS — G6289 Other specified polyneuropathies: Secondary | ICD-10-CM

## 2022-06-09 ENCOUNTER — Other Ambulatory Visit: Payer: Self-pay | Admitting: Family

## 2022-06-09 DIAGNOSIS — G6289 Other specified polyneuropathies: Secondary | ICD-10-CM

## 2022-07-13 ENCOUNTER — Ambulatory Visit (INDEPENDENT_AMBULATORY_CARE_PROVIDER_SITE_OTHER): Payer: 59 | Admitting: Family

## 2022-07-13 ENCOUNTER — Encounter: Payer: Self-pay | Admitting: Family

## 2022-07-13 VITALS — BP 92/55 | HR 84 | Temp 97.2°F | Ht 71.0 in | Wt 276.2 lb

## 2022-07-13 DIAGNOSIS — M1990 Unspecified osteoarthritis, unspecified site: Secondary | ICD-10-CM | POA: Diagnosis not present

## 2022-07-13 DIAGNOSIS — E785 Hyperlipidemia, unspecified: Secondary | ICD-10-CM | POA: Diagnosis not present

## 2022-07-13 DIAGNOSIS — E039 Hypothyroidism, unspecified: Secondary | ICD-10-CM | POA: Diagnosis not present

## 2022-07-13 DIAGNOSIS — M1711 Unilateral primary osteoarthritis, right knee: Secondary | ICD-10-CM

## 2022-07-13 DIAGNOSIS — M545 Low back pain, unspecified: Secondary | ICD-10-CM

## 2022-07-13 DIAGNOSIS — Z0289 Encounter for other administrative examinations: Secondary | ICD-10-CM

## 2022-07-13 DIAGNOSIS — J45909 Unspecified asthma, uncomplicated: Secondary | ICD-10-CM | POA: Diagnosis not present

## 2022-07-13 DIAGNOSIS — G8929 Other chronic pain: Secondary | ICD-10-CM

## 2022-07-13 DIAGNOSIS — D649 Anemia, unspecified: Secondary | ICD-10-CM | POA: Diagnosis not present

## 2022-07-13 DIAGNOSIS — N1832 Chronic kidney disease, stage 3b: Secondary | ICD-10-CM

## 2022-07-13 DIAGNOSIS — I129 Hypertensive chronic kidney disease with stage 1 through stage 4 chronic kidney disease, or unspecified chronic kidney disease: Secondary | ICD-10-CM | POA: Diagnosis not present

## 2022-07-13 DIAGNOSIS — F1120 Opioid dependence, uncomplicated: Secondary | ICD-10-CM

## 2022-07-13 DIAGNOSIS — I10 Essential (primary) hypertension: Secondary | ICD-10-CM

## 2022-07-13 MED ORDER — HYDROCODONE-ACETAMINOPHEN 5-325 MG PO TABS: 1.0000 | ORAL_TABLET | Freq: Two times a day (BID) | ORAL | 0 refills | Status: DC | PRN

## 2022-07-13 NOTE — Patient Instructions (Signed)
Chronic Kidney Disease, Adult Chronic kidney disease (CKD) occurs when the kidneys are slowly and permanently damaged over a long period of time. The kidneys are a pair of organs that do many important jobs in the body, including: Removing waste and extra fluid from the blood to make urine. Making hormones that maintain the amount of fluid in tissues and blood vessels. Maintaining the right amount of fluids and chemicals in the body. A small amount of kidney damage may not cause problems, but a large amount of damage may make it hard or impossible for the kidneys to work right. Steps must be taken to slow kidney damage or to stop it from getting worse. If steps are not taken, the kidneys may stop working permanently (end-stage renal disease, or ESRD). Most of the time, CKD does not go away, but it can often be controlled. People who have CKD are usually able to live full lives. What are the causes? The most common causes of this condition are diabetes and high blood pressure (hypertension). Other causes include: Cardiovascular diseases. These affect the heart and blood vessels. Kidney diseases. These include: Glomerulonephritis, or inflammation of the tiny filters in the kidneys. Interstitial nephritis. This is swelling of the small tubes of the kidneys and of the surrounding structures. Polycystic kidney disease, in which clusters of fluid-filled sacs form within the kidneys. Renal vascular disease. This includes disorders that affect the arteries and veins of the kidneys. Diseases that affect the body's defense system (immune system). A problem with urine flow. This may be caused by: Kidney stones. Cancer. An enlarged prostate, in males. A kidney infection or urinary tract infection (UTI) that keeps coming back. Vasculitis. This is swelling or inflammation of the blood vessels. What increases the risk? Your chances of having kidney disease increase with age. The following factors may make  you more likely to develop this condition: A family history of kidney disease or kidney failure. Kidney failure means the kidneys can no longer work right. Certain genetic diseases. Taking medicines often that are damaging to the kidneys. Being around or being in contact with toxic substances. Obesity. A history of tobacco use. What are the signs or symptoms? Symptoms of this condition include: Feeling very tired (lethargic) and having less energy. Swelling, or edema, of the face, legs, ankles, or feet. Nausea or vomiting, or loss of appetite. Confusion or trouble concentrating. Muscle twitches and cramps, especially in the legs. Dry, itchy skin. A metallic taste in the mouth. Producing less urine, or producing more urine (especially at night). Shortness of breath. Trouble sleeping. CKD may also result in not having enough red blood cells or hemoglobin in the blood (anemia) or having weak bones (bone disease). Symptoms develop slowly and may not be obvious until the kidney damage becomes severe. It is possible to have kidney disease for years without having symptoms. How is this diagnosed? This condition may be diagnosed based on: Blood tests. Urine tests. Imaging tests, such as an ultrasound or a CT scan. A kidney biopsy. This involves removing a sample of kidney tissue to be looked at under a microscope. Results from these tests will help to determine how serious the CKD is. How is this treated? There is no cure for most cases of this condition, but treatment usually relieves symptoms and prevents or slows the worsening of the disease. Treatment may include: Diet changes, which may require you to avoid alcohol and foods that are high in salt, potassium, phosphorous, and protein. Medicines. These may:  Lower blood pressure. Control blood sugar (glucose). Relieve anemia. Relieve swelling. Protect your bones. Improve the balance of salts and minerals in your blood  (electrolytes). Dialysis, which is a type of treatment that removes toxic waste from the body. It may be needed if you have kidney failure. Managing any other conditions that are causing your CKD or making it worse. Follow these instructions at home: Medicines Take over-the-counter and prescription medicines only as told by your health care provider. The amount of some medicines that you take may need to be changed. Do not take any new medicines unless approved by your health care provider. Many medicines can make kidney damage worse. Do not take any vitamin and mineral supplements unless approved by your health care provider. Many nutritional supplements can make kidney damage worse. Lifestyle  Do not use any products that contain nicotine or tobacco, such as cigarettes, e-cigarettes, and chewing tobacco. If you need help quitting, ask your health care provider. If you drink alcohol: Limit how much you use to: 0-1 drink a day for women who are not pregnant. 0-2 drinks a day for men. Know how much alcohol is in your drink. In the [place] one drink equals one 12 oz bottle of beer (355 mL), one 5 oz glass of wine (148 mL), or one 1 oz glass of hard liquor (44 mL). Maintain a healthy weight. If you need help, ask your health care provider. General instructions  Follow instructions from your health care provider about eating or drinking restrictions, including any prescribed diet. Track your blood pressure at home. Report changes in your blood pressure as told. If you are being treated for diabetes, track your blood glucose levels as told. Start or continue an exercise plan. Exercise at least 30 minutes a day, 5 days a week. Keep your immunizations up to date as told. Keep all follow-up visits. This is important. Where to find more information American Association of Kidney Patients: BombTimer.gl National Kidney Foundation: www.kidney.[place]: https://mathis.com/ Life Options:  www.lifeoptions.org Kidney School: www.kidneyschool.org Contact a health care provider if: Your symptoms get worse. You develop new symptoms. Get help right away if: You develop symptoms of ESRD. These include: Headaches. Numbness in your hands or feet. Easy bruising. Frequent hiccups. Chest pain. Shortness of breath. Lack of menstrual periods, in women. You have a fever. You are producing less urine than usual. You have pain or bleeding when you urinate or when you have a bowel movement. These symptoms may represent a serious problem that is an emergency. Do not wait to see if the symptoms will go away. Get medical help right away. Call your local emergency services (911 in the [place] Do not drive yourself to the hospital. Summary Chronic kidney disease (CKD) occurs when the kidneys become damaged slowly over a long period of time. The most common causes of this condition are diabetes and high blood pressure (hypertension). There is no cure for most cases of CKD, but treatment usually relieves symptoms and prevents or slows the worsening of the disease. Treatment may include a combination of lifestyle changes, medicines, and dialysis. This information is not intended to replace advice given to you by your health care provider. Make sure you discuss any questions you have with your health care provider. Document Revised: 09/12/2019 Document Reviewed: 09/12/2019 Elsevier Patient Education  [place].

## 2022-07-13 NOTE — Progress Notes (Signed)
Subjective:    Patient ID: Andrew Phelps, male    DOB: [birth year], 77 y.o.   MRN: [id number]  Chief Complaint  Patient presents with   Medical Management of Chronic Issues   PT presents the office today for chronic follow up and pain medication refill. PT is followed by Nephrologists every 2 months for CKD.  He has peripheral edema.   He is considered morbid obese with a BMI of 38 and co morbidity of HTN and CKD.   Hypertension This is a chronic problem. The current episode started more than 1 year ago. The problem has been resolved since onset. The problem is controlled. Associated symptoms include malaise/fatigue and peripheral edema. Pertinent negatives include no shortness of breath. Risk factors for coronary artery disease include dyslipidemia, obesity and male gender. The current treatment provides moderate improvement. Hypertensive end-organ damage includes kidney disease. There is no history of heart failure. Identifiable causes of hypertension include a thyroid problem.  Asthma He complains of cough. There is no hoarse voice, shortness of breath or wheezing. This is a chronic problem. The current episode started more than 1 year ago. The problem occurs intermittently. Associated symptoms include malaise/fatigue. His symptoms are alleviated by rest. He reports moderate improvement on treatment. His past medical history is significant for asthma.  Thyroid Problem Presents for follow-up visit. Patient reports no cold intolerance, constipation, dry skin, fatigue or hoarse voice. The symptoms have been stable. His past medical history is significant for hyperlipidemia. There is no history of heart failure.  Arthritis Presents for follow-up visit. He complains of pain and stiffness. He reports no joint warmth. The symptoms have been stable. Affected locations include the right knee, left knee, left MCP, right MCP, left foot and right foot. His pain is at a severity of 9/10. Pertinent  negatives include no fatigue.  Back Pain This is a chronic problem. The current episode started more than 1 year ago. The problem occurs intermittently. The problem has been waxing and waning since onset. The pain is present in the lumbar spine. The pain is at a severity of 9/10.  Anemia Presents for follow-up visit. Symptoms include malaise/fatigue. There is no history of heart failure.  Insomnia Primary symptoms: difficulty falling asleep, malaise/fatigue.   The current episode started more than one year. The onset quality is gradual. The problem occurs intermittently. The treatment provided mild relief.  Hyperlipidemia Pertinent negatives include no shortness of breath.   Current opioids rx- Norco 5-325 mg # meds rx- 60 Effectiveness of current meds-stable Adverse reactions from pain meds-none Morphine equivalent- 10   Pill count performed-No Last drug screen - today ( high risk q27m moderate risk q663mlow risk yearly ) Urine drug screen today- Yes [place]             If yes were their any concerning findings? - none   Pain contract signed on: 10/08/21   Review of Systems  Constitutional:  Positive for malaise/fatigue. Negative for fatigue.  HENT:  Negative for hoarse voice.   Respiratory:  Positive for cough. Negative for shortness of breath and wheezing.   Gastrointestinal:  Negative for constipation.  Endocrine: Negative for cold intolerance.  Musculoskeletal:  Positive for arthritis, back pain and stiffness.  Psychiatric/Behavioral:  The patient has insomnia.   All other systems reviewed and are negative.      Objective:   Physical Exam Vitals reviewed.  Constitutional:      General: He is not in  acute distress.    Appearance: He is well-developed. He is obese.  HENT:     Head: Normocephalic.     Right Ear: Tympanic membrane normal.     Left Ear: Tympanic membrane normal.  Eyes:     General:        Right eye: No discharge.        Left eye:  No discharge.     Pupils: Pupils are equal, round, and reactive to light.  Neck:     Thyroid: No thyromegaly.  Cardiovascular:     Rate and Rhythm: Normal rate and regular rhythm.     Heart sounds: Normal heart sounds. No murmur heard. Pulmonary:     Effort: Pulmonary effort is normal. No respiratory distress.     Breath sounds: Normal breath sounds. No wheezing.  Abdominal:     General: Bowel sounds are normal. There is no distension.     Palpations: Abdomen is soft.     Tenderness: There is no abdominal tenderness.  Musculoskeletal:        General: No tenderness. Normal range of motion.     Cervical back: Normal range of motion and neck supple.     Right lower leg: Edema (2+  in ankles, legs greatly improved to trace) present.     Left lower leg: Edema (2+ in ankles, legs greatly improved to trace) present.  Skin:    General: Skin is warm and dry.     Findings: No erythema or rash.  Neurological:     Mental Status: He is alert and oriented to person, place, and time.     Cranial Nerves: No cranial nerve deficit.     Deep Tendon Reflexes: Reflexes are normal and symmetric.  Psychiatric:        Behavior: Behavior normal.        Thought Content: Thought content normal.        Judgment: Judgment normal.       BP (!) 90/50   Pulse 84   Temp (!) 97.2 F (36.2 C) (Temporal)   Ht '5\' 11"'$  (1.803 m)   Wt 276 lb 3.2 oz (125.3 kg)   SpO2 98%   BMI 38.52 kg/m      Assessment & Plan:  Javonnie Illescas comes in today with chief complaint of Medical Management of Chronic Issues   Diagnosis and orders addressed:  1. Arthritis - HYDROcodone-acetaminophen (NORCO) 5-325 MG tablet; Take 1 tablet by mouth every 12 (twelve) hours as needed for moderate pain.  Dispense: 60 tablet; Refill: 0 - HYDROcodone-acetaminophen (NORCO) 5-325 MG tablet; Take 1 tablet by mouth every 12 (twelve) hours as needed for moderate pain.  Dispense: 60 tablet; Refill: 0 - HYDROcodone-acetaminophen  (NORCO/VICODIN) 5-325 MG tablet; Take 1 tablet by mouth every 12 (twelve) hours as needed for moderate pain.  Dispense: 60 tablet; Refill: 0 - CMP14+EGFR  2. Pain medication agreement signed - HYDROcodone-acetaminophen (NORCO) 5-325 MG tablet; Take 1 tablet by mouth every 12 (twelve) hours as needed for moderate pain.  Dispense: 60 tablet; Refill: 0 - HYDROcodone-acetaminophen (NORCO) 5-325 MG tablet; Take 1 tablet by mouth every 12 (twelve) hours as needed for moderate pain.  Dispense: 60 tablet; Refill: 0 - HYDROcodone-acetaminophen (NORCO/VICODIN) 5-325 MG tablet; Take 1 tablet by mouth every 12 (twelve) hours as needed for moderate pain.  Dispense: 60 tablet; Refill: 0 - CMP14+EGFR  3. Uncomplicated opioid dependence (HCC) - HYDROcodone-acetaminophen (NORCO) 5-325 MG tablet; Take 1 tablet by mouth every 12 (twelve) hours as  needed for moderate pain.  Dispense: 60 tablet; Refill: 0 - HYDROcodone-acetaminophen (NORCO) 5-325 MG tablet; Take 1 tablet by mouth every 12 (twelve) hours as needed for moderate pain.  Dispense: 60 tablet; Refill: 0 - HYDROcodone-acetaminophen (NORCO/VICODIN) 5-325 MG tablet; Take 1 tablet by mouth every 12 (twelve) hours as needed for moderate pain.  Dispense: 60 tablet; Refill: 0 - CMP14+EGFR  4. Chronic bilateral low back pain without sciatica - HYDROcodone-acetaminophen (NORCO) 5-325 MG tablet; Take 1 tablet by mouth every 12 (twelve) hours as needed for moderate pain.  Dispense: 60 tablet; Refill: 0 - HYDROcodone-acetaminophen (NORCO) 5-325 MG tablet; Take 1 tablet by mouth every 12 (twelve) hours as needed for moderate pain.  Dispense: 60 tablet; Refill: 0 - HYDROcodone-acetaminophen (NORCO/VICODIN) 5-325 MG tablet; Take 1 tablet by mouth every 12 (twelve) hours as needed for moderate pain.  Dispense: 60 tablet; Refill: 0 - CMP14+EGFR  5. Hypothyroidism, unspecified type - CMP14+EGFR  6. Hyperlipidemia, unspecified hyperlipidemia type - CMP14+EGFR  7.  Essential hypertension Stop Valsartan 20 mg because of hypotension - CMP14+EGFR  8. Stage 3b chronic kidney disease ([place]) - CMP14+EGFR  9. Chronic asthma without complication, unspecified asthma severity, unspecified whether persistent - CMP14+EGFR  10. Anemia, unspecified type - CMP14+EGFR  11. Unilateral primary osteoarthritis, right knee - CMP14+EGFR   Labs pending Health Maintenance reviewed Diet and exercise encouraged  Follow up plan: 2 weeks to recheck hypotension and keep nephrologists appt   Evelina Dun, FNP

## 2022-07-14 ENCOUNTER — Telehealth: Payer: Self-pay | Admitting: Family

## 2022-07-14 LAB — CMP14+EGFR
ALT: 11 IU/L (ref 0–44)
AST: 14 IU/L (ref 0–40)
Albumin/Globulin Ratio: 1.3 (ref 1.2–2.2)
Albumin: 4.3 g/dL (ref 3.8–4.8)
Alkaline Phosphatase: 84 IU/L (ref 44–121)
BUN/Creatinine Ratio: 20 (ref 10–24)
BUN: 80 mg/dL (ref 8–27)
Bilirubin Total: 1.1 mg/dL (ref 0.0–1.2)
CO2: 26 mmol/L (ref 20–29)
Calcium: 9.5 mg/dL (ref 8.6–10.2)
Chloride: 84 mmol/L — ABNORMAL LOW (ref 96–106)
Creatinine, Ser: 4.03 mg/dL (ref 0.76–1.27)
Globulin, Total: 3.3 g/dL (ref 1.5–4.5)
Glucose: 84 mg/dL (ref 70–99)
Potassium: 4.3 mmol/L (ref 3.5–5.2)
Sodium: 131 mmol/L — ABNORMAL LOW (ref 134–144)
Total Protein: 7.6 g/dL (ref 6.0–8.5)
eGFR: 15 mL/min/{1.73_m2} — ABNORMAL LOW (ref >59)

## 2022-07-14 NOTE — Telephone Encounter (Signed)
[employer] called with critical lab result.   Creatinine 4.03

## 2022-07-15 ENCOUNTER — Encounter (HOSPITAL_COMMUNITY): Payer: Self-pay | Admitting: *Deleted

## 2022-07-15 ENCOUNTER — Other Ambulatory Visit: Payer: Self-pay | Admitting: Family

## 2022-07-15 ENCOUNTER — Inpatient Hospital Stay (HOSPITAL_COMMUNITY)
Admission: EM | Admit: 2022-07-15 | Discharge: 2022-07-17 | DRG: 683 | Disposition: A | Discharge status: Home / Self Care | Payer: 59 | Attending: Internal Medicine | Admitting: Internal Medicine

## 2022-07-15 ENCOUNTER — Emergency Department (HOSPITAL_COMMUNITY): Payer: 59

## 2022-07-15 ENCOUNTER — Other Ambulatory Visit: Payer: Self-pay

## 2022-07-15 DIAGNOSIS — Z8249 Family history of ischemic heart disease and other diseases of the circulatory system: Secondary | ICD-10-CM

## 2022-07-15 DIAGNOSIS — Z87891 Personal history of nicotine dependence: Secondary | ICD-10-CM

## 2022-07-15 DIAGNOSIS — R109 Unspecified abdominal pain: Secondary | ICD-10-CM | POA: Diagnosis not present

## 2022-07-15 DIAGNOSIS — R5381 Other malaise: Secondary | ICD-10-CM | POA: Diagnosis not present

## 2022-07-15 DIAGNOSIS — R809 Proteinuria, unspecified: Secondary | ICD-10-CM | POA: Diagnosis not present

## 2022-07-15 DIAGNOSIS — E869 Volume depletion, unspecified: Secondary | ICD-10-CM | POA: Diagnosis not present

## 2022-07-15 DIAGNOSIS — I129 Hypertensive chronic kidney disease with stage 1 through stage 4 chronic kidney disease, or unspecified chronic kidney disease: Secondary | ICD-10-CM | POA: Diagnosis not present

## 2022-07-15 DIAGNOSIS — I700 Atherosclerosis of aorta: Secondary | ICD-10-CM | POA: Diagnosis not present

## 2022-07-15 DIAGNOSIS — N179 Acute kidney failure, unspecified: Principal | ICD-10-CM | POA: Diagnosis present

## 2022-07-15 DIAGNOSIS — Z6838 Body mass index (BMI) 38.0-38.9, adult: Secondary | ICD-10-CM | POA: Diagnosis not present

## 2022-07-15 DIAGNOSIS — E669 Obesity, unspecified: Secondary | ICD-10-CM | POA: Diagnosis present

## 2022-07-15 DIAGNOSIS — E785 Hyperlipidemia, unspecified: Secondary | ICD-10-CM | POA: Diagnosis present

## 2022-07-15 DIAGNOSIS — J45909 Unspecified asthma, uncomplicated: Secondary | ICD-10-CM

## 2022-07-15 DIAGNOSIS — G629 Polyneuropathy, unspecified: Secondary | ICD-10-CM | POA: Diagnosis present

## 2022-07-15 DIAGNOSIS — E038 Other specified hypothyroidism: Secondary | ICD-10-CM | POA: Diagnosis not present

## 2022-07-15 DIAGNOSIS — E7800 Pure hypercholesterolemia, unspecified: Secondary | ICD-10-CM | POA: Diagnosis not present

## 2022-07-15 DIAGNOSIS — Z79899 Other long term (current) drug therapy: Secondary | ICD-10-CM

## 2022-07-15 DIAGNOSIS — E871 Hypo-osmolality and hyponatremia: Secondary | ICD-10-CM | POA: Insufficient documentation

## 2022-07-15 DIAGNOSIS — F1120 Opioid dependence, uncomplicated: Secondary | ICD-10-CM | POA: Diagnosis present

## 2022-07-15 DIAGNOSIS — R600 Localized edema: Secondary | ICD-10-CM | POA: Diagnosis not present

## 2022-07-15 DIAGNOSIS — E039 Hypothyroidism, unspecified: Secondary | ICD-10-CM | POA: Diagnosis not present

## 2022-07-15 DIAGNOSIS — Z83438 Family history of other disorder of lipoprotein metabolism and other lipidemia: Secondary | ICD-10-CM

## 2022-07-15 DIAGNOSIS — Z885 Allergy status to narcotic agent status: Secondary | ICD-10-CM

## 2022-07-15 DIAGNOSIS — T502X5A Adverse effect of carbonic-anhydrase inhibitors, benzothiadiazides and other diuretics, initial encounter: Secondary | ICD-10-CM | POA: Diagnosis not present

## 2022-07-15 DIAGNOSIS — M1990 Unspecified osteoarthritis, unspecified site: Secondary | ICD-10-CM | POA: Diagnosis present

## 2022-07-15 DIAGNOSIS — N1832 Chronic kidney disease, stage 3b: Secondary | ICD-10-CM | POA: Diagnosis not present

## 2022-07-15 DIAGNOSIS — F1021 Alcohol dependence, in remission: Secondary | ICD-10-CM | POA: Diagnosis present

## 2022-07-15 DIAGNOSIS — D649 Anemia, unspecified: Secondary | ICD-10-CM | POA: Diagnosis not present

## 2022-07-15 DIAGNOSIS — G6289 Other specified polyneuropathies: Secondary | ICD-10-CM

## 2022-07-15 DIAGNOSIS — N183 Chronic kidney disease, stage 3 unspecified: Secondary | ICD-10-CM | POA: Diagnosis not present

## 2022-07-15 DIAGNOSIS — R17 Unspecified jaundice: Secondary | ICD-10-CM | POA: Diagnosis not present

## 2022-07-15 DIAGNOSIS — G4700 Insomnia, unspecified: Secondary | ICD-10-CM | POA: Diagnosis present

## 2022-07-15 DIAGNOSIS — I959 Hypotension, unspecified: Secondary | ICD-10-CM | POA: Diagnosis not present

## 2022-07-15 DIAGNOSIS — M109 Gout, unspecified: Secondary | ICD-10-CM | POA: Diagnosis present

## 2022-07-15 DIAGNOSIS — Z880 Allergy status to penicillin: Secondary | ICD-10-CM

## 2022-07-15 DIAGNOSIS — E079 Disorder of thyroid, unspecified: Secondary | ICD-10-CM | POA: Diagnosis not present

## 2022-07-15 DIAGNOSIS — E66812 Obesity, class 2: Secondary | ICD-10-CM | POA: Insufficient documentation

## 2022-07-15 DIAGNOSIS — Z79890 Hormone replacement therapy (postmenopausal): Secondary | ICD-10-CM

## 2022-07-15 DIAGNOSIS — J302 Other seasonal allergic rhinitis: Secondary | ICD-10-CM

## 2022-07-15 LAB — URINALYSIS, ROUTINE W REFLEX MICROSCOPIC
Bacteria, UA: NONE SEEN
Bilirubin Urine: NEGATIVE
Glucose, UA: NEGATIVE mg/dL
Hgb urine dipstick: NEGATIVE
Ketones, ur: NEGATIVE mg/dL
Nitrite: NEGATIVE
Protein, ur: NEGATIVE mg/dL
Specific Gravity, Urine: 1.006 (ref 1.005–1.030)
pH: 7 (ref 5.0–8.0)

## 2022-07-15 LAB — CBC WITH DIFFERENTIAL/PLATELET
Abs Immature Granulocytes: 0.04 10*3/uL (ref 0.00–0.07)
Basophils Absolute: 0.1 10*3/uL (ref 0.0–0.1)
Basophils Relative: 1 %
Eosinophils Absolute: 0.4 10*3/uL (ref 0.0–0.5)
Eosinophils Relative: 4 %
HCT: 35.9 % — ABNORMAL LOW (ref 39.0–52.0)
Hemoglobin: 12.5 g/dL — ABNORMAL LOW (ref 13.0–17.0)
Immature Granulocytes: 0 %
Lymphocytes Relative: 17 %
Lymphs Abs: 1.6 10*3/uL (ref 0.7–4.0)
MCH: 31.1 pg (ref 26.0–34.0)
MCHC: 34.8 g/dL (ref 30.0–36.0)
MCV: 89.3 fL (ref 80.0–100.0)
Monocytes Absolute: 0.5 10*3/uL (ref 0.1–1.0)
Monocytes Relative: 5 %
Neutro Abs: 7 10*3/uL (ref 1.7–7.7)
Neutrophils Relative %: 73 %
Platelets: 184 10*3/uL (ref 150–400)
RBC: 4.02 MIL/uL — ABNORMAL LOW (ref 4.22–5.81)
RDW: 14.4 % (ref 11.5–15.5)
WBC: 9.7 10*3/uL (ref 4.0–10.5)
nRBC: 0 % (ref 0.0–0.2)

## 2022-07-15 LAB — COMPREHENSIVE METABOLIC PANEL
ALT: 12 U/L (ref 0–44)
AST: 16 U/L (ref 15–41)
Albumin: 3.8 g/dL (ref 3.5–5.0)
Alkaline Phosphatase: 66 U/L (ref 38–126)
Anion gap: 15 (ref 5–15)
BUN: 90 mg/dL — ABNORMAL HIGH (ref 8–23)
CO2: 26 mmol/L (ref 22–32)
Calcium: 8.6 mg/dL — ABNORMAL LOW (ref 8.9–10.3)
Chloride: 84 mmol/L — ABNORMAL LOW (ref 98–111)
Creatinine, Ser: 4.21 mg/dL — ABNORMAL HIGH (ref 0.61–1.24)
GFR, Estimated: 14 mL/min — ABNORMAL LOW (ref >60)
Glucose, Bld: 97 mg/dL (ref 70–99)
Potassium: 3.7 mmol/L (ref 3.5–5.1)
Sodium: 125 mmol/L — ABNORMAL LOW (ref 135–145)
Total Bilirubin: 1.9 mg/dL — ABNORMAL HIGH (ref 0.3–1.2)
Total Protein: 7.9 g/dL (ref 6.5–8.1)

## 2022-07-15 LAB — BILIRUBIN, FRACTIONATED(TOT/DIR/INDIR)
Bilirubin, Direct: 0.3 mg/dL — ABNORMAL HIGH (ref 0.0–0.2)
Indirect Bilirubin: 1.4 mg/dL — ABNORMAL HIGH (ref 0.3–0.9)
Total Bilirubin: 1.7 mg/dL — ABNORMAL HIGH (ref 0.3–1.2)

## 2022-07-15 LAB — LACTATE DEHYDROGENASE: LDH: 134 U/L (ref 98–192)

## 2022-07-15 LAB — CK: Total CK: 218 U/L (ref 49–397)

## 2022-07-15 MED ORDER — VITAMIN D 25 MCG (1000 UNIT) PO TABS
1000.0000 [IU] | ORAL_TABLET | Freq: Every day | ORAL | Status: DC
  Administered 2022-07-16: 1000 [IU] via ORAL
  Filled 2022-07-15: qty 1

## 2022-07-15 MED ORDER — GABAPENTIN 600 MG PO TABS
300.0000 mg | ORAL_TABLET | Freq: Two times a day (BID) | ORAL | Status: DC
  Filled 2022-07-15 (×2): qty 0.5

## 2022-07-15 MED ORDER — LEVOTHYROXINE SODIUM 112 MCG PO TABS
112.0000 ug | ORAL_TABLET | Freq: Every day | ORAL | Status: DC
  Administered 2022-07-16 – 2022-07-17 (×2): 112 ug via ORAL
  Filled 2022-07-15 (×2): qty 1

## 2022-07-15 MED ORDER — ALLOPURINOL 100 MG PO TABS
100.0000 mg | ORAL_TABLET | Freq: Every day | ORAL | Status: DC
  Administered 2022-07-16: 100 mg via ORAL
  Filled 2022-07-15: qty 1

## 2022-07-15 MED ORDER — SODIUM CHLORIDE 0.9 % IV SOLN: INTRAVENOUS | Status: AC

## 2022-07-15 MED ORDER — HEPARIN SODIUM (PORCINE) 5000 UNIT/ML IJ SOLN
5000.0000 [IU] | Freq: Three times a day (TID) | INTRAMUSCULAR | Status: DC
  Administered 2022-07-15 – 2022-07-17 (×5): 5000 [IU] via SUBCUTANEOUS
  Filled 2022-07-15 (×5): qty 1

## 2022-07-15 MED ORDER — ONDANSETRON HCL 4 MG PO TABS: 4.0000 mg | ORAL_TABLET | Freq: Four times a day (QID) | ORAL | Status: DC | PRN

## 2022-07-15 MED ORDER — ACETAMINOPHEN 325 MG PO TABS: 650.0000 mg | ORAL_TABLET | Freq: Four times a day (QID) | ORAL | Status: DC | PRN

## 2022-07-15 MED ORDER — LORATADINE 10 MG PO TABS
10.0000 mg | ORAL_TABLET | Freq: Every day | ORAL | Status: DC
  Administered 2022-07-16: 10 mg via ORAL
  Filled 2022-07-15: qty 1

## 2022-07-15 MED ORDER — ONDANSETRON HCL 4 MG/2ML IJ SOLN: 4.0000 mg | Freq: Four times a day (QID) | INTRAMUSCULAR | Status: DC | PRN

## 2022-07-15 MED ORDER — SODIUM CHLORIDE 0.9 % IV SOLN: INTRAVENOUS | Status: DC

## 2022-07-15 MED ORDER — HYDROCODONE-ACETAMINOPHEN 5-325 MG PO TABS: 1.0000 | ORAL_TABLET | Freq: Two times a day (BID) | ORAL | Status: DC | PRN

## 2022-07-15 MED ORDER — MONTELUKAST SODIUM 10 MG PO TABS
10.0000 mg | ORAL_TABLET | Freq: Every day | ORAL | Status: DC
  Administered 2022-07-15 – 2022-07-16 (×2): 10 mg via ORAL
  Filled 2022-07-15 (×2): qty 1

## 2022-07-15 MED ORDER — GABAPENTIN 300 MG PO CAPS
300.0000 mg | ORAL_CAPSULE | Freq: Two times a day (BID) | ORAL | Status: DC
  Administered 2022-07-15 – 2022-07-16 (×3): 300 mg via ORAL
  Filled 2022-07-15 (×3): qty 1

## 2022-07-15 MED ORDER — ACETAMINOPHEN 650 MG RE SUPP: 650.0000 mg | Freq: Four times a day (QID) | RECTAL | Status: DC | PRN

## 2022-07-15 NOTE — ED Triage Notes (Signed)
Pt c/o feeling weak for the last few weeks; pt states his kidney doctor told him to come to ED

## 2022-07-15 NOTE — Hospital Course (Addendum)
77 year old male with a history of hypertension, hyperlipidemia, CKD stage IIIb, hypothyroidism, opioid dependence, asthma, and tobacco abuse presenting at the instruction of his PCP for acute on chronic renal failure.  The patient had a routine visit with his PCP on 07/13/2022.  Routine blood work was obtained at that time showed his serum creatinine up to 4.03.  The patient was called and instructed to go to the emergency department for further evaluation and treatment.  Notation was made that his valsartan was stopped on the day of that visit secondary to soft blood pressure. Notably, the patient follows Dr. Theador Hawthorne for his renal care.  Review the medical record shows that he had a visit with nephrology on 06/01/2022.  At that time, the patient was instructed to increase his torsemide to 100 mg daily and metolazone 5 mg two days per week was added. Since the change, patient states his weight has dropped from 284 to 276 lbs.  The patient himself has been feeling some generalized weakness and malaise and poor oral intake in the past few weeks prior to admission.  He denies any fevers, chills, headache, chest pain, shortness breath, cough, hemoptysis, nausea, vomiting, direct abdominal pain, dysuria, hematuria.  He did notice some decreased urine output in the past week.  He denies any NSAIDs.  In the ED, the patient was afebrile and hemodynamically stable albeit with soft blood pressures.  Oxygen saturation was 95-96% on room air.  WBC 9.7, hemoglobin 12.5, platelets 1-84,000.  Sodium 125, potassium 3.7, bicarbonate 26, BUN 90, creatinine 4.21.  AST 16, ALT 12, alk phos 66, total bilirubin 1.9, albumin 3.8.  UA was negative for any pyuria or microscopic hematuria.  EKG shows sinus rhythm with prolonged PR interval with nonspecific T wave changes.  The patient was started on IV fluids.  He was admitted for further evaluation and treatment of his renal failure.  Nephrology was consult and agreed with current  management of holding diuretics and gentle IVF.

## 2022-07-15 NOTE — H&P (Signed)
History and Physical    Patient: Andrew Phelps OFH:[id number] DOB: 18-Jun-1946 DOA: 07/15/2022 DOS: the patient was seen and examined on 07/15/2022 PCP: Sharion Balloon, FNP  Patient coming from: Home  Chief Complaint:  Chief Complaint  Patient presents with   Weakness   HPI: [name] is a 77 year old male with a history of hypertension, hyperlipidemia, CKD stage IIIb, hypothyroidism, opioid dependence, asthma, and tobacco abuse presenting at the instruction of his PCP for acute on chronic renal failure.  The patient had a routine visit with his PCP on 07/13/2022.  Routine blood work was obtained at that time showed his serum creatinine up to 4.03.  The patient was called and instructed to go to the emergency department for further evaluation and treatment.  Notation was made that his valsartan was stopped on the day of that visit secondary to soft blood pressure. Notably, the patient follows Dr. Theador Hawthorne for his renal care.  Review the medical record shows that he had a visit with nephrology on 06/01/2022.  At that time, the patient was instructed to increase his torsemide to 100 mg daily and metolazone 5 mg two days per week was added. Since the change, patient states his weight has dropped from 284 to 276 lbs.  The patient himself has been feeling some generalized weakness and malaise and poor oral intake in the past few weeks prior to admission.  He denies any fevers, chills, headache, chest pain, shortness breath, cough, hemoptysis, nausea, vomiting, direct abdominal pain, dysuria, hematuria.  He did notice some decreased urine output in the past week.  He denies any NSAIDs.  In the ED, the patient was afebrile and hemodynamically stable albeit with soft blood pressures.  Oxygen saturation was 95-96% on room air.  WBC 9.7, hemoglobin 12.5, platelets 1-84,000.  Sodium 125, potassium 3.7, bicarbonate 26, BUN 90, creatinine 4.21.  AST 16, ALT 12, alk phos 66, total bilirubin  1.9, albumin 3.8.  UA was negative for any pyuria or microscopic hematuria.  EKG shows sinus rhythm with prolonged PR interval with nonspecific T wave changes.  The patient was started on IV fluids.  He was admitted for further evaluation and treatment of his renal failure.  Review of Systems: As mentioned in the history of present illness. All other systems reviewed and are negative. Past Medical History:  Diagnosis Date   Arthritis    Asthma    Back pain    Family history of adverse reaction to anesthesia    Gout    Hyperlipidemia    Hypertension    Hypothyroidism    Insomnia    Joint pain    Neuropathy    Sleep apnea    not bad enought to do anyting about   Thyroid disease    Past Surgical History:  Procedure Laterality Date   COLONOSCOPY WITH PROPOFOL N/A 11/05/2019   Procedure: COLONOSCOPY WITH PROPOFOL;  Surgeon: Daneil Dolin, MD;  Location: [facility];  Service: Endoscopy;  Laterality: N/A;  12:30pm   none     POLYPECTOMY  11/05/2019   Procedure: POLYPECTOMY;  Surgeon: Daneil Dolin, MD;  Location: [facility];  Service: Endoscopy;;   Social History:  reports that he quit smoking about 27 years ago. His smoking use included cigarettes. He has a 50.00 pack-year smoking history. His smokeless tobacco use includes chew. He reports that he does not currently use alcohol. He reports that he does not use drugs.  Allergies  Allergen Reactions  Codeine     Abdominal pain   Penicillins Rash    Childhood reaction    Family History  Problem Relation Age of Onset   Heart disease Mother    Heart disease Father    Hypertension Brother    Hyperlipidemia Brother    Heart disease Sister    Heart disease Brother    Heart disease Brother    Cancer Brother    Colon cancer Neg Hx    Liver disease Neg Hx     Prior to Admission medications   Medication Sig Start Date End Date Taking? Authorizing Provider  allopurinol (ZYLOPRIM) 300 MG tablet TAKE ONE TABLET BY MOUTH  DAILY 04/15/22  Yes Evelina Dun A, FNP  calcitRIOL (ROCALTROL) 0.25 MCG capsule Take by mouth. 06/03/22 06/03/23 Yes [provider]  cholecalciferol (VITAMIN D3) 25 MCG (1000 UNIT) tablet Take 1,000 Units by mouth daily.   Yes [provider]  gabapentin (NEURONTIN) 600 MG tablet TAKE ONE TABLET BY MOUTH THREE TIMES DAILY 07/15/22  Yes Hawks, Christy A, FNP  HYDROcodone-acetaminophen (NORCO) 5-325 MG tablet Take 1 tablet by mouth every 12 (twelve) hours as needed for moderate pain. 07/13/22  Yes Hawks, Alyse Low A, FNP  levothyroxine (SYNTHROID) 112 MCG tablet TAKE ONE TABLET BY MOUTH DAILY 02/15/22  Yes Evelina Dun A, FNP  loratadine (ALLERGY RELIEF) 10 MG tablet TAKE ONE TABLET BY MOUTH EVERY DAY 07/15/22  Yes Hawks, Christy A, FNP  metolazone (ZAROXOLYN) 5 MG tablet Take 5 mg by mouth 3 (three) times a week. On Mon. Wed and Fridays 06/02/22 06/02/23 Yes [provider]  mometasone (NASONEX) 50 MCG/ACT nasal spray INSTILL TWO SPRAYS INTO EACH NOSTRIL DAILY 07/15/22  Yes Hawks, Christy A, FNP  montelukast (SINGULAIR) 10 MG tablet TAKE ONE TABLET BY MOUTH AT BEDTIME 07/15/22  Yes Hawks, Christy A, FNP  Omega-3 Fatty Acids (OMEGA-3 FISH OIL PO) Take 1 capsule by mouth daily.    Yes [provider]  potassium chloride SA (KLOR-CON M) 20 MEQ tablet Take 20 mEq by mouth 3 (three) times a week. 06/10/21 07/15/22 Yes [provider]  simvastatin (ZOCOR) 20 MG tablet Take 1 tablet (20 mg total) by mouth 1 day or 1 dose for 360 doses. Patient taking differently: Take 20 mg by mouth daily. 10/08/21 10/03/22 Yes Hawks, Christy A, FNP  torsemide (DEMADEX) 100 MG tablet Take 100 mg by mouth daily. 06/16/22  Yes [provider]  HYDROcodone-acetaminophen (NORCO) 5-325 MG tablet Take 1 tablet by mouth every 12 (twelve) hours as needed for moderate pain. Patient not taking: Reported on 07/15/2022 07/13/22   Sharion Balloon, FNP  HYDROcodone-acetaminophen  (NORCO/VICODIN) 5-325 MG tablet Take 1 tablet by mouth every 12 (twelve) hours as needed for moderate pain. Patient not taking: Reported on 07/15/2022 07/13/22   Sharion Balloon, FNP    Physical Exam: Vitals:   07/15/22 1035 07/15/22 1200 07/15/22 1245  BP: 98/64 (!) 89/64 103/64  Pulse: 95 88 82  Resp: '18 20 13  '$ SpO2: 96% 93% 95%  Weight: 125 kg    Height: '5\' 11"'$  (1.803 m)     GENERAL:  A&O x 3, NAD, well developed, cooperative, follows commands HEENT: [place]/AT, No thrush, No icterus, No oral ulcers Neck:  No neck mass, No meningismus, soft, supple CV: RRR, no S3, no S4, no rub, no JVD Lungs:  CTA, no wheeze, no rhonchi, good air movement Abd: soft/NT +BS, nondistended Ext: 1 + LE edema, no lymphangitis, no cyanosis, no rashes  Neuro:  CN II-XII intact, strength 4/5 in RUE, RLE, strength 4/5 LUE, LLE; sensation intact bilateral; no dysmetria; babinski equivocal  Data Reviewed: Data reviewed in the history above  Assessment and Plan: Acute on chronic renal failure--CKD3b -Baseline creatinine 1.7-2.0 -presented with serum creatinine 4.21 -Secondary to hemodynamic changes in the setting of torsemide and metolazone -Holding metolazone and torsemide temporarily -CT abdomen negative for hydronephrosis -Judicious IV fluids for the next 24 hours -07/15/22 CT abd--mild perinephric stranding without hydronephrosis  Essential hypertension -Previously on valsartan which was discontinued on his visit on 07/13/2022 -Blood pressure has been soft  Opioid dependence -PDMP reviewed-- -patient receives hydrocodone 5/325, #60 monthly, last refill 06/09/2022 -Continue gabapentin at lower dose  Hyperlipidemia -Continue simvastatin -Check CPK  Hypothyroidism -Continue Synthroid  Hyponatremia -Secondary to acute kidney injury and metolazone -Monitor serial BMP  Hyperbilirubinemia -Fractionate bili  Class II obesity -BMI 38.43 -Lifestyle modification  History of alcohol dependence--in  remission -quit 3 years ago -drank approx ten x 20 oz beers daily for many years       Advance Care Planning: FULL CODE  Consults: none  Family Communication: none  Severity of Illness: The appropriate patient status for this patient is INPATIENT. Inpatient status is judged to be reasonable and necessary in order to provide the required intensity of service to ensure the patient's safety. The patient's presenting symptoms, physical exam findings, and initial radiographic and laboratory data in the context of their chronic comorbidities is felt to place them at high risk for further clinical deterioration. Furthermore, it is not anticipated that the patient will be medically stable for discharge from the hospital within 2 midnights of admission.   * I certify that at the point of admission it is my clinical judgment that the patient will require inpatient hospital care spanning beyond 2 midnights from the point of admission due to high intensity of service, high risk for further deterioration and high frequency of surveillance required.*  Author: Orson Eva, MD 07/15/2022 1:55 PM  For on call review [email or web address].

## 2022-07-15 NOTE — ED Provider Notes (Signed)
[facility] Provider Note   CSN: [id number] Arrival date & time: 07/15/22  4270     History  Chief Complaint  Patient presents with   Weakness    Andrew Phelps is a 77 y.o. male.   Weakness  Chief complaint is abnormal blood work and weakness  This patient is a 77 year old male, he has a history of multiple medical problems including hypertension, he has chronic edema of the legs on torsemide and recently prescribed metolazone, he takes hydrocodone for pain, gabapentin and he is also on carbidopa levodopa.  The patient takes allopurinol for gout and simvastatin for cholesterol.  He sees his family doctor once every 3 months and when he was at the office a couple of days ago blood work was drawn, he was called yesterday and told to come to the hospital because of kidney failure.  The patient's chronic kidney levels have a creatinine of close to 2, he was 4 on labs yesterday.  He reports that he has had some issues with urinating last week but now is able to freely urinate and has no difficulties at all.  He has no abdominal pain back pain and he is not nauseated though he does feel generally fatigued and does not have much appetite recently.    Home Medications Prior to Admission medications   Medication Sig Start Date End Date Taking? Authorizing Provider  ALLERGY RELIEF 10 MG tablet TAKE ONE TABLET BY MOUTH EVERY DAY 04/15/22   Evelina Dun A, FNP  allopurinol (ZYLOPRIM) 300 MG tablet TAKE ONE TABLET BY MOUTH DAILY 04/15/22   Evelina Dun A, FNP  calcitRIOL (ROCALTROL) 0.25 MCG capsule Take by mouth. 06/03/22 06/03/23  [provider]  carbidopa-levodopa (SINEMET CR) 50-200 MG tablet Take by mouth. 08/25/17   [provider]  cholecalciferol (VITAMIN D3) 25 MCG (1000 UNIT) tablet Take 1,000 Units by mouth daily.    [provider]  gabapentin (NEURONTIN) 600 MG tablet TAKE ONE TABLET BY MOUTH THREE  TIMES DAILY 06/09/22   Evelina Dun A, FNP  HYDROcodone-acetaminophen (NORCO) 5-325 MG tablet Take 1 tablet by mouth every 12 (twelve) hours as needed for moderate pain. 07/13/22   Sharion Balloon, FNP  HYDROcodone-acetaminophen (NORCO) 5-325 MG tablet Take 1 tablet by mouth every 12 (twelve) hours as needed for moderate pain. 07/13/22   Sharion Balloon, FNP  HYDROcodone-acetaminophen (NORCO/VICODIN) 5-325 MG tablet Take 1 tablet by mouth every 12 (twelve) hours as needed for moderate pain. 07/13/22   Sharion Balloon, FNP  levothyroxine (SYNTHROID) 112 MCG tablet TAKE ONE TABLET BY MOUTH DAILY 02/15/22   Evelina Dun A, FNP  metolazone (ZAROXOLYN) 5 MG tablet Take by mouth. 06/02/22 06/02/23  [provider]  mometasone (NASONEX) 50 MCG/ACT nasal spray INSTILL TWO SPRAYS INTO EACH NOSTRIL DAILY 04/15/22   Evelina Dun A, FNP  montelukast (SINGULAIR) 10 MG tablet TAKE ONE TABLET BY MOUTH AT BEDTIME 04/15/22   Hawks, Alyse Low A, FNP  Omega-3 Fatty Acids (OMEGA-3 FISH OIL PO) Take 1 capsule by mouth daily.     [provider]  potassium chloride SA (KLOR-CON M) 20 MEQ tablet Take by mouth. 06/10/21 06/10/22  [provider]  simvastatin (ZOCOR) 20 MG tablet Take 1 tablet (20 mg total) by mouth 1 day or 1 dose for 360 doses. 10/08/21 10/03/22  Evelina Dun A, FNP  torsemide (DEMADEX) 20 MG tablet Take 80 mg by mouth daily. 09/15/21   [provider]  Allergies    Codeine and Penicillins    Review of Systems   Review of Systems  Neurological:  Positive for weakness.  All other systems reviewed and are negative.   Physical Exam Updated Vital Signs There were no vitals taken for this visit. Physical Exam Vitals and nursing note reviewed.  Constitutional:      General: He is not in acute distress.    Appearance: He is well-developed.  HENT:     Head: Normocephalic and atraumatic.     Mouth/Throat:     Pharynx: No oropharyngeal exudate.  Eyes:      General: No scleral icterus.       Right eye: No discharge.        Left eye: No discharge.     Conjunctiva/sclera: Conjunctivae normal.     Pupils: Pupils are equal, round, and reactive to light.  Neck:     Thyroid: No thyromegaly.     Vascular: No JVD.  Cardiovascular:     Rate and Rhythm: Normal rate and regular rhythm.     Heart sounds: Normal heart sounds. No murmur heard.    No friction rub. No gallop.  Pulmonary:     Effort: Pulmonary effort is normal. No respiratory distress.     Breath sounds: Normal breath sounds. No wheezing or rales.  Abdominal:     General: Bowel sounds are normal. There is no distension.     Palpations: Abdomen is soft. There is no mass.     Tenderness: There is no abdominal tenderness.  Musculoskeletal:        General: No tenderness. Normal range of motion.     Cervical back: Normal range of motion and neck supple.     Right lower leg: Edema present.     Left lower leg: Edema present.     Comments: 1+ symmetrical lower extremity pitting edema  Lymphadenopathy:     Cervical: No cervical adenopathy.  Skin:    General: Skin is warm and dry.     Findings: No erythema or rash.  Neurological:     Mental Status: He is alert.     Coordination: Coordination normal.  Psychiatric:        Behavior: Behavior normal.     ED Results / Procedures / Treatments   Labs (all labs ordered are listed, but only abnormal results are displayed) Labs Reviewed  COMPREHENSIVE METABOLIC PANEL  CBC WITH DIFFERENTIAL/PLATELET  URINALYSIS, ROUTINE W REFLEX MICROSCOPIC    EKG None  Radiology No results found.  Procedures .Critical Care  Performed by: Noemi Chapel, MD Authorized by: Noemi Chapel, MD   Critical care provider statement:    Critical care time (minutes):  45   Critical care time was exclusive of:  Separately billable procedures and treating other patients   Critical care was necessary to treat or prevent imminent or life-threatening  deterioration of the following conditions:  Renal failure   Critical care was time spent personally by me on the following activities:  Development of treatment plan with patient or surrogate, discussions with consultants, evaluation of patient's response to treatment, examination of patient, obtaining history from patient or surrogate, review of old charts, re-evaluation of patient's condition, pulse oximetry, ordering and review of radiographic studies, ordering and review of laboratory studies and ordering and performing treatments and interventions   I assumed direction of critical care for this patient from another provider in my specialty: no     Care discussed with: admitting provider   Comments:  Medications Ordered in ED Medications  0.9 %  sodium chloride infusion (has no administration in time range)    ED Course/ Medical Decision Making/ A&P                             Medical Decision Making Amount and/or Complexity of Data Reviewed Labs: ordered. Radiology: ordered.  Risk Prescription drug management. Decision regarding hospitalization.   This patient presents to the ED for concern of abnormal lab, creatinine is significantly elevated, this involves an extensive number of treatment options, and is a complaint that carries with it a high risk of complications and morbidity.  The differential diagnosis includes renal failure, acute on chronic, this could be urinary retention, could be urinary tract infection, will need IV fluids and admission to the hospital with investigation into the cause and worsening of his symptoms   Co morbidities that complicate the patient evaluation  Edema on diuretics, renal insufficiency at baseline, hypertension   Additional history obtained:  Additional history obtained from electronic medical record External records from outside source obtained and reviewed including office visits and outside blood work   Lab Tests:  I  Ordered, and personally interpreted labs.  The pertinent results include: Significant elevation in the creatinine to over 4, the BUN is at 90   Imaging Studies ordered:  I ordered imaging studies including CT scan of the abdomen and pelvis was ordered, the results are pending at the time of admission   Cardiac Monitoring: / EKG:  The patient was maintained on a cardiac monitor.  I personally viewed and interpreted the cardiac monitored which showed an underlying rhythm of: Normal sinus rhythm, hypotension treated with IV fluids   Consultations Obtained:  I requested consultation with the hospitalist,  and discussed lab and imaging findings as well as pertinent plan - they recommend: Admission to the hospital and fluid resuscitation   Problem List / ED Course / Critical interventions / Medication management  The patient appears acutely ill because of acute renal failure, he is uremic with a BUN of 90 and weakness, he is hypotensive and requiring IV fluids.  He does not appear to be in shock as he does not have a significant leukocytosis nor is he tachycardic, he has no sources of infection.  I suspect this is due to dehydration and renal failure I ordered medication including IV fluids for hypotension and renal failure Reevaluation of the patient after these medicines showed that the patient critically ill but improving I have reviewed the patients home medicines and have made adjustments as needed   Social Determinants of Health:  Chronic renal insufficiency much worse today   Test / Admission - Considered:  Will admit to the hospital to higher level of care         Final Clinical Impression(s) / ED Diagnoses Final diagnoses:  Acute renal failure, unspecified acute renal failure type ([place])  Hyponatremia     Noemi Chapel, MD 07/15/22 1250

## 2022-07-15 NOTE — Telephone Encounter (Signed)
See result note

## 2022-07-16 DIAGNOSIS — N1832 Chronic kidney disease, stage 3b: Secondary | ICD-10-CM | POA: Diagnosis not present

## 2022-07-16 DIAGNOSIS — N179 Acute kidney failure, unspecified: Secondary | ICD-10-CM | POA: Diagnosis not present

## 2022-07-16 DIAGNOSIS — E669 Obesity, unspecified: Secondary | ICD-10-CM | POA: Diagnosis not present

## 2022-07-16 DIAGNOSIS — E871 Hypo-osmolality and hyponatremia: Secondary | ICD-10-CM | POA: Diagnosis not present

## 2022-07-16 LAB — COMPREHENSIVE METABOLIC PANEL
ALT: 11 U/L (ref 0–44)
AST: 14 U/L — ABNORMAL LOW (ref 15–41)
Albumin: 3.4 g/dL — ABNORMAL LOW (ref 3.5–5.0)
Alkaline Phosphatase: 60 U/L (ref 38–126)
Anion gap: 13 (ref 5–15)
BUN: 86 mg/dL — ABNORMAL HIGH (ref 8–23)
CO2: 26 mmol/L (ref 22–32)
Calcium: 8.4 mg/dL — ABNORMAL LOW (ref 8.9–10.3)
Chloride: 89 mmol/L — ABNORMAL LOW (ref 98–111)
Creatinine, Ser: 3.61 mg/dL — ABNORMAL HIGH (ref 0.61–1.24)
GFR, Estimated: 17 mL/min — ABNORMAL LOW (ref >60)
Glucose, Bld: 92 mg/dL (ref 70–99)
Potassium: 3.6 mmol/L (ref 3.5–5.1)
Sodium: 128 mmol/L — ABNORMAL LOW (ref 135–145)
Total Bilirubin: 1.3 mg/dL — ABNORMAL HIGH (ref 0.3–1.2)
Total Protein: 7.1 g/dL (ref 6.5–8.1)

## 2022-07-16 LAB — MAGNESIUM: Magnesium: 2.1 mg/dL (ref 1.7–2.4)

## 2022-07-16 NOTE — Progress Notes (Signed)
PROGRESS NOTE  Andrew Phelps BZJ:[id number] DOB: [birth year] DOA: 07/15/2022 PCP: Sharion Balloon, FNP  Brief History:  77 year old male with a history of hypertension, hyperlipidemia, CKD stage IIIb, hypothyroidism, opioid dependence, asthma, and tobacco abuse presenting at the instruction of his PCP for acute on chronic renal failure.  The patient had a routine visit with his PCP on 07/13/2022.  Routine blood work was obtained at that time showed his serum creatinine up to 4.03.  The patient was called and instructed to go to the emergency department for further evaluation and treatment.  Notation was made that his valsartan was stopped on the day of that visit secondary to soft blood pressure. Notably, the patient follows Dr. Theador Hawthorne for his renal care.  Review the medical record shows that he had a visit with nephrology on 06/01/2022.  At that time, the patient was instructed to increase his torsemide to 100 mg daily and metolazone 5 mg two days per week was added. Since the change, patient states his weight has dropped from 284 to 276 lbs.  The patient himself has been feeling some generalized weakness and malaise and poor oral intake in the past few weeks prior to admission.  He denies any fevers, chills, headache, chest pain, shortness breath, cough, hemoptysis, nausea, vomiting, direct abdominal pain, dysuria, hematuria.  He did notice some decreased urine output in the past week.  He denies any NSAIDs.  In the ED, the patient was afebrile and hemodynamically stable albeit with soft blood pressures.  Oxygen saturation was 95-96% on room air.  WBC 9.7, hemoglobin 12.5, platelets 1-84,000.  Sodium 125, potassium 3.7, bicarbonate 26, BUN 90, creatinine 4.21.  AST 16, ALT 12, alk phos 66, total bilirubin 1.9, albumin 3.8.  UA was negative for any pyuria or microscopic hematuria.  EKG shows sinus rhythm with prolonged PR interval with nonspecific T wave changes.  The patient was  started on IV fluids.  He was admitted for further evaluation and treatment of his renal failure.   Assessment/Plan: Acute on chronic renal failure--CKD3b -Baseline creatinine 1.7-2.0 -presented with serum creatinine 4.21 -Secondary to hemodynamic changes in the setting of torsemide and metolazone -Holding metolazone and torsemide temporarily -CT abdomen negative for hydronephrosis -Judicious IV fluids for the next 24 hours>>improving -07/15/22 CT abd--mild perinephric stranding without hydronephrosis   Essential hypertension -Previously on valsartan which was discontinued on his visit on 07/13/2022 -Blood pressure has been soft   Opioid dependence -PDMP reviewed-- -patient receives hydrocodone 5/325, #60 monthly, last refill 06/09/2022 -Continue gabapentin at lower dose   Hyperlipidemia -Continue simvastatin -Check CPK--218   Hypothyroidism -Continue Synthroid   Hyponatremia -Secondary to acute kidney injury and metolazone -Monitor serial BMP   Hyperbilirubinemia -Fractionate bili--mostly indirect -LDH 134   Class II obesity -BMI 38.43 -Lifestyle modification   History of alcohol dependence--in remission -quit 3 years ago -drank approx ten x 20 oz beers daily for many years          Family Communication:   no Family at bedside  Consultants:  renal  Code Status:  FULL DVT Prophylaxis:  [place] Heparin    Procedures: As Listed in Progress Note Above  Antibiotics: None      Subjective: Patient denies fevers, chills, headache, chest pain, dyspnea, nausea, vomiting, diarrhea, abdominal pain, dysuria, hematuria, hematochezia, and melena.   Objective: Vitals:   07/15/22 2231 07/16/22 0402 07/16/22 0558 07/16/22 1246  BP: 105/85 (!) 92/54 96/62 (!) 102/58  Pulse: 84 75 71 86  Resp: '17 19  20  '$ Temp: 98.2 F (36.8 C) 98.3 F (36.8 C)  98.2 F (36.8 C)  TempSrc:    Oral  SpO2: 100% 96%  97%  Weight:      Height:        Intake/Output Summary  (Last 24 hours) at 07/16/2022 1858 Last data filed at 07/16/2022 1500 Gross per 24 hour  Intake 1593.75 ml  Output 650 ml  Net 943.75 ml   Weight change:  Exam:  General:  Pt is alert, follows commands appropriately, not in acute distress HEENT: No icterus, No thrush, No neck mass, [place]/AT Cardiovascular: RRR, S1/S2, no rubs, no gallops Respiratory: CTA bilaterally, no wheezing, no crackles, no rhonchi Abdomen: Soft/+BS, non tender, non distended, no guarding Extremities: trace LE edema, No lymphangitis, No petechiae, No rashes, no synovitis   Data Reviewed: I have personally reviewed following labs and imaging studies Basic Metabolic Panel: Recent Labs  Lab 07/13/22 0917 07/15/22 1036 07/16/22 0400  NA 131* 125* 128*  K 4.3 3.7 3.6  CL 84* 84* 89*  CO2 '26 26 26  '$ GLUCOSE 84 97 92  BUN 80* 90* 86*  CREATININE 4.03* 4.21* 3.61*  CALCIUM 9.5 8.6* 8.4*  MG  --   --  2.1   Liver Function Tests: Recent Labs  Lab 07/13/22 0917 07/15/22 1036 07/16/22 0400  AST 14 16 14*  ALT '11 12 11  '$ ALKPHOS 84 66 60  BILITOT 1.1 1.7*  1.9* 1.3*  PROT 7.6 7.9 7.1  ALBUMIN 4.3 3.8 3.4*   No results for input(s): "LIPASE", "AMYLASE" in the last 168 hours. No results for input(s): "AMMONIA" in the last 168 hours. Coagulation Profile: No results for input(s): "INR", "PROTIME" in the last 168 hours. CBC: Recent Labs  Lab 07/15/22 1036  WBC 9.7  NEUTROABS 7.0  HGB 12.5*  HCT 35.9*  MCV 89.3  PLT 184   Cardiac Enzymes: Recent Labs  Lab 07/15/22 1036  CKTOTAL 218   BNP: Invalid input(s): "POCBNP" CBG: No results for input(s): "GLUCAP" in the last 168 hours. HbA1C: No results for input(s): "HGBA1C" in the last 72 hours. Urine analysis:    Component Value Date/Time   COLORURINE STRAW (A) 07/15/2022 1130   APPEARANCEUR CLEAR 07/15/2022 1130   APPEARANCEUR Clear 07/13/2016 1542   LABSPEC 1.006 07/15/2022 1130   PHURINE 7.0 07/15/2022 1130   GLUCOSEU NEGATIVE 07/15/2022  1130   HGBUR NEGATIVE 07/15/2022 1130   BILIRUBINUR NEGATIVE 07/15/2022 1130   BILIRUBINUR Negative 07/13/2016 1542   KETONESUR NEGATIVE 07/15/2022 1130   PROTEINUR NEGATIVE 07/15/2022 1130   UROBILINOGEN negative 03/21/2014 0838   NITRITE NEGATIVE 07/15/2022 1130   LEUKOCYTESUR TRACE (A) 07/15/2022 1130   Sepsis Labs: '@LABRCNTIP'$ (procalcitonin:4,lacticidven:4) )No results found for this or any previous visit (from the past 240 hour(s)).   Scheduled Meds:  allopurinol  100 mg Oral Daily   cholecalciferol  1,000 Units Oral Daily   gabapentin  300 mg Oral BID   heparin  5,000 Units Subcutaneous Q8H   levothyroxine  112 mcg Oral Daily   loratadine  10 mg Oral Daily   montelukast  10 mg Oral QHS   Continuous Infusions:  sodium chloride Stopped (07/15/22 1445)    Procedures/Studies: CT ABDOMEN PELVIS WO CONTRAST  Result Date: 07/15/2022 CLINICAL DATA:  Abdominal pain, acute, nonlocalized. Acute renal failure, weakness. EXAM: CT ABDOMEN AND PELVIS WITHOUT CONTRAST TECHNIQUE: Multidetector CT imaging of the abdomen and pelvis was performed following the standard  protocol without IV contrast. RADIATION DOSE REDUCTION: This exam was performed according to the departmental dose-optimization program which includes automated exposure control, adjustment of the mA and/or kV according to patient size and/or use of iterative reconstruction technique. COMPARISON:  Renal ultrasound 08/28/2019. Right upper quadrant ultrasound 08/23/2016. FINDINGS: Lower chest: Linear atelectasis or scarring in the lung bases. Bibasilar pleural thickening. No pericardial effusion. Atherosclerotic calcifications of the thoracic aorta. Hepatobiliary: No focal liver abnormality is seen. No gallstones, gallbladder wall thickening, or biliary dilatation. Pancreas: By atrophy of the pancreas. No ductal dilatation or surrounding inflammation. Spleen: Normal in size without focal abnormality. Adrenals/Urinary Tract: Unremarkable  adrenals. No renal masses or calculi. Mild bilateral perinephric stranding. No hydronephrosis. Bladder is unremarkable. Stomach/Bowel: Unremarkable stomach and duodenal. No dilated loops of small bowel. Appendix is not visualized. Sigmoid diverticulosis without evidence of acute diverticulitis. Redundant sigmoid colon. Vascular/Lymphatic: Atherosclerotic calcifications of the abdominal aorta and its branches. No suspicious lymphadenopathy. Reproductive: Prostate is unremarkable. Other: No abdominal wall hernia or abnormality. No abdominopelvic ascites. Musculoskeletal: Lower lumbar spine degeneration. No suspicious bone lesions. IMPRESSION: 1. Mild perinephric stranding, nonspecific. No renal calculi or hydronephrosis. 2. Sigmoid diverticulosis without evidence of acute diverticulitis. Aortic Atherosclerosis (ICD10-I70.0). Electronically Signed   By: Emmit Alexanders M.D.   On: 07/15/2022 13:25    Orson Eva, DO  [facility]  If 7PM-7AM, please contact night-coverage [email or web address] Password [facility] 07/16/2022, 6:58 PM   LOS: 1 day

## 2022-07-16 NOTE — Care Management Important Message (Signed)
Important Message  Patient Details  Name: Panayiotis Rainville MRN: [id number] Date of Birth: 08/05/1945   Medicare Important Message Given:  Yes     Tommy Medal 07/16/2022, 11:28 AM

## 2022-07-16 NOTE — Progress Notes (Signed)
Patient is resting in his bed at this time. Patient ambulated to the restroom independently 2 times during this shift. Patient tolerated well. No complaints of pain or discomfort. Most recent vitals signs are T 98.3 oral, P 75, RR 19, B/P 92/54 lying, O2 sat 96% on RA

## 2022-07-16 NOTE — Consult Note (Signed)
[facility] Renal Consultation Note  Requesting MD: Tat Indication for Consultation: A on CRF  HPI:  Andrew Phelps is a 77 y.o. male with past medical history significant for HTN, tobacco abuse and opiod dependence and what appears to be longstanding CKD dating back to 2015-  minimal proteinuria-  CKD I guess felt to be due to HTN but also history of positive ANCA? Was on valsartan and on calcitriol.  Followed by Dr. Theador Hawthorne as OP and last seen in December 2023-  crt 1.85. Apparently his torsemide/metolazone was increased at that visit and he had a good response. He had a routine visit with his PCP on 1/23 found to have crt of 4 so told to come in  BP noted to be soft at that visit- valsartan stopped.  BP in the ER as low as 89/64-  diuretics were stopped and given ivf-  crt briefly up to 4.2 but down to 3.6 this AM-  imaging without hydro-  urine bland.  He feels much better -  asking about going home   Creatinine, Ser  Date/Time Value Ref Range Status  07/16/2022 04:00 AM 3.61 (H) 0.61 - 1.24 mg/dL Final  07/15/2022 10:36 AM 4.21 (H) 0.61 - 1.24 mg/dL Final  07/13/2022 09:17 AM 4.03 (HH) 0.76 - 1.27 mg/dL Final    Comment:                      Client Requested Flag  01/07/2022 11:10 AM 1.91 (H) 0.76 - 1.27 mg/dL Final  07/09/2021 10:19 AM 1.85 (H) 0.76 - 1.27 mg/dL Final  04/07/2021 08:29 AM 1.72 (H) 0.76 - 1.27 mg/dL Final  09/30/2020 11:12 AM 1.71 (H) 0.76 - 1.27 mg/dL Final  07/02/2020 10:51 AM 1.49 (H) 0.76 - 1.27 mg/dL Final  06/06/2020 09:50 AM 1.44 (H) 0.61 - 1.24 mg/dL Final  04/01/2020 10:29 AM 1.46 (H) 0.76 - 1.27 mg/dL Final  10/01/2019 09:00 AM 1.47 (H) 0.76 - 1.27 mg/dL Final  07/02/2019 09:05 AM 1.77 (H) 0.76 - 1.27 mg/dL Final  12/12/2018 09:04 AM 1.64 (H) 0.76 - 1.27 mg/dL Final  09/07/2018 09:12 AM 1.45 (H) 0.76 - 1.27 mg/dL Final  06/06/2018 10:10 AM 1.88 (H) 0.76 - 1.27 mg/dL Final  03/06/2018 08:39 AM 1.53 (H) 0.76 - 1.27 mg/dL Final   12/01/2017 08:55 AM 1.64 (H) 0.76 - 1.27 mg/dL Final  08/25/2017 09:23 AM 1.57 (H) 0.76 - 1.27 mg/dL Final  05/26/2017 09:03 AM 1.50 (H) 0.76 - 1.27 mg/dL Final  02/24/2017 08:51 AM 1.87 (H) 0.76 - 1.27 mg/dL Final  11/18/2016 09:45 AM 1.66 (H) 0.76 - 1.27 mg/dL Final  08/16/2016 09:32 AM 1.48 (H) 0.76 - 1.27 mg/dL Final  06/08/2016 02:42 PM 1.66 (H) 0.76 - 1.27 mg/dL Final  05/12/2016 08:37 AM 1.91 (H) 0.76 - 1.27 mg/dL Final  01/02/2016 11:01 AM 1.76 (H) 0.76 - 1.27 mg/dL Final  07/04/2015 11:32 AM 1.56 (H) 0.76 - 1.27 mg/dL Final  12/11/2014 03:02 PM 1.63 (H) 0.76 - 1.27 mg/dL Final  03/21/2014 08:41 AM 1.90 (H) 0.76 - 1.27 mg/dL Final  06/05/2013 11:19 AM 1.28 (H) 0.76 - 1.27 mg/dL Final     PMHx:   Past Medical History:  Diagnosis Date   Arthritis    Asthma    Back pain    Family history of adverse reaction to anesthesia    Gout    Hyperlipidemia    Hypertension    Hypothyroidism    Insomnia    Joint  pain    Neuropathy    Sleep apnea    not bad enought to do anyting about   Thyroid disease     Past Surgical History:  Procedure Laterality Date   COLONOSCOPY WITH PROPOFOL N/A 11/05/2019   Procedure: COLONOSCOPY WITH PROPOFOL;  Surgeon: Daneil Dolin, MD;  [facility];  Service: Endoscopy;  Laterality: N/A;  12:30pm   none     POLYPECTOMY  11/05/2019   Procedure: POLYPECTOMY;  Surgeon: Daneil Dolin, MD;  Location: [facility];  Service: Endoscopy;;    Family Hx:  Family History  Problem Relation Age of Onset   Heart disease Mother    Heart disease Father    Hypertension Brother    Hyperlipidemia Brother    Heart disease Sister    Heart disease Brother    Heart disease Brother    Cancer Brother    Colon cancer Neg Hx    Liver disease Neg Hx     Social History:  reports that he quit smoking about 27 years ago. His smoking use included cigarettes. He has a 50.00 pack-year smoking history. His smokeless tobacco use includes chew. He reports  that he does not currently use alcohol. He reports that he does not use drugs.  Allergies:  Allergies  Allergen Reactions   Codeine     Abdominal pain   Penicillins Rash    Childhood reaction    Medications: Prior to Admission medications   Medication Sig Start Date End Date Taking? Authorizing Provider  allopurinol (ZYLOPRIM) 300 MG tablet TAKE ONE TABLET BY MOUTH DAILY 04/15/22  Yes Evelina Dun A, FNP  calcitRIOL (ROCALTROL) 0.25 MCG capsule Take by mouth. 06/03/22 06/03/23 Yes [provider]  cholecalciferol (VITAMIN D3) 25 MCG (1000 UNIT) tablet Take 1,000 Units by mouth daily.   Yes [provider]  gabapentin (NEURONTIN) 600 MG tablet TAKE ONE TABLET BY MOUTH THREE TIMES DAILY 07/15/22  Yes Hawks, Christy A, FNP  HYDROcodone-acetaminophen (NORCO) 5-325 MG tablet Take 1 tablet by mouth every 12 (twelve) hours as needed for moderate pain. 07/13/22  Yes Hawks, Alyse Low A, FNP  levothyroxine (SYNTHROID) 112 MCG tablet TAKE ONE TABLET BY MOUTH DAILY 02/15/22  Yes Evelina Dun A, FNP  loratadine (ALLERGY RELIEF) 10 MG tablet TAKE ONE TABLET BY MOUTH EVERY DAY 07/15/22  Yes Hawks, Christy A, FNP  metolazone (ZAROXOLYN) 5 MG tablet Take 5 mg by mouth 3 (three) times a week. On Mon. Wed and Fridays 06/02/22 06/02/23 Yes [provider]  mometasone (NASONEX) 50 MCG/ACT nasal spray INSTILL TWO SPRAYS INTO EACH NOSTRIL DAILY 07/15/22  Yes Hawks, Christy A, FNP  montelukast (SINGULAIR) 10 MG tablet TAKE ONE TABLET BY MOUTH AT BEDTIME 07/15/22  Yes Hawks, Christy A, FNP  Omega-3 Fatty Acids (OMEGA-3 FISH OIL PO) Take 1 capsule by mouth daily.    Yes [provider]  potassium chloride SA (KLOR-CON M) 20 MEQ tablet Take 20 mEq by mouth 3 (three) times a week. 06/10/21 07/15/22 Yes [provider]  simvastatin (ZOCOR) 20 MG tablet Take 1 tablet (20 mg total) by mouth 1 day or 1 dose for 360 doses. Patient taking differently: Take 20 mg by mouth daily.  10/08/21 10/03/22 Yes Hawks, Christy A, FNP  torsemide (DEMADEX) 100 MG tablet Take 100 mg by mouth daily. 06/16/22  Yes [provider]  HYDROcodone-acetaminophen (NORCO) 5-325 MG tablet Take 1 tablet by mouth every 12 (twelve) hours as needed for moderate pain. Patient not taking:  Reported on 07/15/2022 07/13/22   Sharion Balloon, FNP  HYDROcodone-acetaminophen (NORCO/VICODIN) 5-325 MG tablet Take 1 tablet by mouth every 12 (twelve) hours as needed for moderate pain. Patient not taking: Reported on 07/15/2022 07/13/22   Sharion Balloon, FNP    I have reviewed the patient's current medications.  Labs:  Results for orders placed or performed during the hospital encounter of 07/15/22 (from the past 48 hour(s))  Comprehensive metabolic panel     Status: Abnormal   Collection Time: 07/15/22 10:36 AM  Result Value Ref Range   Sodium 125 (L) 135 - 145 mmol/L   Potassium 3.7 3.5 - 5.1 mmol/L   Chloride 84 (L) 98 - 111 mmol/L   CO2 26 22 - 32 mmol/L   Glucose, Bld 97 70 - 99 mg/dL    Comment: Glucose reference range applies only to samples taken after fasting for at least 8 hours.   BUN 90 (H) 8 - 23 mg/dL   Creatinine, Ser 4.21 (H) 0.61 - 1.24 mg/dL   Calcium 8.6 (L) 8.9 - 10.3 mg/dL   Total Protein 7.9 6.5 - 8.1 g/dL   Albumin 3.8 3.5 - 5.0 g/dL   AST 16 15 - 41 U/L   ALT 12 0 - 44 U/L   Alkaline Phosphatase 66 38 - 126 U/L   Total Bilirubin 1.9 (H) 0.3 - 1.2 mg/dL   GFR, Estimated 14 (L) >60 mL/min    Comment: (NOTE) Calculated using the CKD-EPI Creatinine Equation (2021)    Anion gap 15 5 - 15    Comment: Performed at [facility], [address], [place], [place] [address]  CBC with Differential     Status: Abnormal   Collection Time: 07/15/22 10:36 AM  Result Value Ref Range   WBC 9.7 4.0 - 10.5 K/uL   RBC 4.02 (L) 4.22 - 5.81 MIL/uL   Hemoglobin 12.5 (L) 13.0 - 17.0 g/dL   HCT 35.9 (L) 39.0 - 52.0 %   MCV 89.3 80.0 - 100.0 fL   MCH 31.1 26.0 - 34.0 pg   MCHC 34.8 30.0  - 36.0 g/dL   RDW 14.4 11.5 - 15.5 %   Platelets 184 150 - 400 K/uL   nRBC 0.0 0.0 - 0.2 %   Neutrophils Relative % 73 %   Neutro Abs 7.0 1.7 - 7.7 K/uL   Lymphocytes Relative 17 %   Lymphs Abs 1.6 0.7 - 4.0 K/uL   Monocytes Relative 5 %   Monocytes Absolute 0.5 0.1 - 1.0 K/uL   Eosinophils Relative 4 %   Eosinophils Absolute 0.4 0.0 - 0.5 K/uL   Basophils Relative 1 %   Basophils Absolute 0.1 0.0 - 0.1 K/uL   Immature Granulocytes 0 %   Abs Immature Granulocytes 0.04 0.00 - 0.07 K/uL    Comment: Performed at [facility], [address], [place], [place] [address]  Bilirubin, fractionated(tot/dir/indir)     Status: Abnormal   Collection Time: 07/15/22 10:36 AM  Result Value Ref Range   Total Bilirubin 1.7 (H) 0.3 - 1.2 mg/dL   Bilirubin, Direct 0.3 (H) 0.0 - 0.2 mg/dL   Indirect Bilirubin 1.4 (H) 0.3 - 0.9 mg/dL    Comment: Performed at [facility], [address] [place],  [address]  CK     Status: None   Collection Time: 07/15/22 10:36 AM  Result Value Ref Range   Total CK 218 49 - 397 U/L    Comment: Performed at [facility], [place]  [address], [place], [place] [address]  Lactate dehydrogenase     Status: None   Collection Time: 07/15/22 10:36 AM  Result Value Ref Range   LDH 134 98 - 192 U/L    Comment: Performed at [facility], [address] [place], [place] [address]  Urinalysis, Routine w reflex microscopic -Urine, Clean Catch     Status: Abnormal   Collection Time: 07/15/22 11:30 AM  Result Value Ref Range   Color, Urine STRAW (A) YELLOW   APPearance CLEAR CLEAR   Specific Gravity, Urine 1.006 1.005 - 1.030   pH 7.0 5.0 - 8.0   Glucose, UA NEGATIVE NEGATIVE mg/dL   Hgb urine dipstick NEGATIVE NEGATIVE   Bilirubin Urine NEGATIVE NEGATIVE   Ketones, ur NEGATIVE NEGATIVE mg/dL   Protein, ur NEGATIVE NEGATIVE mg/dL   Nitrite NEGATIVE NEGATIVE   Leukocytes,Ua TRACE (A) NEGATIVE   RBC / HPF 0-5 0 - 5 RBC/hpf   WBC, UA 0-5 0 - 5 WBC/hpf   Bacteria, UA NONE  SEEN NONE SEEN   Squamous Epithelial / HPF 0-5 0 - 5 /HPF   Hyaline Casts, UA PRESENT     Comment: Performed at [facility], [address], [place], [place] [address]  Comprehensive metabolic panel     Status: Abnormal   Collection Time: 07/16/22  4:00 AM  Result Value Ref Range   Sodium 128 (L) 135 - 145 mmol/L   Potassium 3.6 3.5 - 5.1 mmol/L   Chloride 89 (L) 98 - 111 mmol/L   CO2 26 22 - 32 mmol/L   Glucose, Bld 92 70 - 99 mg/dL    Comment: Glucose reference range applies only to samples taken after fasting for at least 8 hours.   BUN 86 (H) 8 - 23 mg/dL   Creatinine, Ser 3.61 (H) 0.61 - 1.24 mg/dL   Calcium 8.4 (L) 8.9 - 10.3 mg/dL   Total Protein 7.1 6.5 - 8.1 g/dL   Albumin 3.4 (L) 3.5 - 5.0 g/dL   AST 14 (L) 15 - 41 U/L   ALT 11 0 - 44 U/L   Alkaline Phosphatase 60 38 - 126 U/L   Total Bilirubin 1.3 (H) 0.3 - 1.2 mg/dL   GFR, Estimated 17 (L) >60 mL/min    Comment: (NOTE) Calculated using the CKD-EPI Creatinine Equation (2021)    Anion gap 13 5 - 15    Comment: Performed at [facility], [address] [place], [place] [address]  Magnesium     Status: None   Collection Time: 07/16/22  4:00 AM  Result Value Ref Range   Magnesium 2.1 1.7 - 2.4 mg/dL    Comment: Performed at [facility], [address] [place], [place] [address]     ROS:  A comprehensive review of systems was negative.  Physical Exam: Vitals:   07/16/22 0402 07/16/22 0558  BP: (!) 92/54 96/62  Pulse: 75 71  Resp: 19   Temp: 98.3 F (36.8 C)   SpO2: 96%      General: alert, NAD HEENT: PERRLA, EOMI-  mucous membranes moist Neck: no JVD Heart: RRR Lungs: clear Abdomen: obese, non tender Extremities: none to trace edema Skin: warm and dry Neuro: non focal   Assessment/Plan: 77 year old WM with medical issues including longstanding CKD.  Now with A on CRF 1.Renal- A on CRF.  Seems like a clear cut case of ATN from over control of BP in the setting of increased diuretic and continued ARB.   Trending better with meds  on hold and giving IVF.  Urine is bland and no obstruction so not suspicious of anything else going on here.  Continue current plan-  I like the stop point on the IVF so he does not get overloaded again.  If renal function trends better again in AM I would be ok with discharge- would stay off arb-  maybe resume the demedex at 50 daily in a couple of days-  no scheduled metolazone and get him back to see Theador Hawthorne in a few weeks 2. Hypertension/volume  - as above -  over control of BP-  is still a little low-  will take some time for valsartan to get out of system 3. Hyponatremia-  due to diuretics-  improving 4. Anemia  - not an issue at this time   Pt will not be physically seen over the weekend-  hopefully will be for quick discharge-  call with questions    Louis Meckel 07/16/2022, 10:09 AM

## 2022-07-16 NOTE — TOC Progression Note (Signed)
Transition of Care [facility]) - Progression Note    Patient Details  Name: Andrew Phelps MRN: [id number] Date of Birth: 1946/04/26  [facility] CM/SW Contact  Boneta Lucks, RN Phone Number: 07/16/2022, 2:20 PM  Clinical Narrative:   Patient admitted for acute renal failure, live at home with room mate. TOC left message for daughter to review chart and get safe DC plan. TOC following.      Expected Discharge Plan and Services       Living arrangements for the past 2 months: Single Family Home                    Social Determinants of Health (SDOH) Interventions SDOH Screenings   Food Insecurity: No Food Insecurity (07/15/2022)  Housing: Low Risk  (10/09/2021)  Transportation Needs: No Transportation Needs (07/15/2022)  Utilities: Not At Risk (07/15/2022)  Alcohol Screen: Low Risk  (10/09/2021)  Depression (PHQ2-9): Low Risk  (07/13/2022)  Financial Resource Strain: Low Risk  (10/09/2021)  Physical Activity: Insufficiently Active (10/09/2021)  Social Connections: Socially Isolated (10/09/2021)  Stress: No Stress Concern Present (10/09/2021)  Tobacco Use: High Risk (07/15/2022)    Readmission Risk Interventions     No data to display

## 2022-07-17 DIAGNOSIS — E669 Obesity, unspecified: Secondary | ICD-10-CM | POA: Diagnosis not present

## 2022-07-17 DIAGNOSIS — N179 Acute kidney failure, unspecified: Secondary | ICD-10-CM | POA: Diagnosis not present

## 2022-07-17 DIAGNOSIS — E871 Hypo-osmolality and hyponatremia: Secondary | ICD-10-CM | POA: Diagnosis not present

## 2022-07-17 DIAGNOSIS — N1832 Chronic kidney disease, stage 3b: Secondary | ICD-10-CM | POA: Diagnosis not present

## 2022-07-17 LAB — RENAL FUNCTION PANEL
Albumin: 3.3 g/dL — ABNORMAL LOW (ref 3.5–5.0)
Anion gap: 11 (ref 5–15)
BUN: 72 mg/dL — ABNORMAL HIGH (ref 8–23)
CO2: 24 mmol/L (ref 22–32)
Calcium: 8.8 mg/dL — ABNORMAL LOW (ref 8.9–10.3)
Chloride: 93 mmol/L — ABNORMAL LOW (ref 98–111)
Creatinine, Ser: 2.87 mg/dL — ABNORMAL HIGH (ref 0.61–1.24)
GFR, Estimated: 22 mL/min — ABNORMAL LOW (ref >60)
Glucose, Bld: 98 mg/dL (ref 70–99)
Phosphorus: 3.9 mg/dL (ref 2.5–4.6)
Potassium: 3.5 mmol/L (ref 3.5–5.1)
Sodium: 128 mmol/L — ABNORMAL LOW (ref 135–145)

## 2022-07-17 LAB — HAPTOGLOBIN: Haptoglobin: 253 mg/dL (ref 34–355)

## 2022-07-17 MED ORDER — TORSEMIDE 100 MG PO TABS: 50.0000 mg | ORAL_TABLET | Freq: Every day | ORAL | Status: DC

## 2022-07-17 NOTE — Discharge Summary (Signed)
Physician Discharge Summary   Patient: Andrew Phelps MRN: [id number] DOB: 1945-07-03  Admit date:     07/15/2022  Discharge date: 07/17/22  Discharge Physician: Shanon Brow Denisia Harpole   PCP: Sharion Balloon, FNP   Recommendations at discharge:   Please follow up with primary care provider within 1-2 weeks  Please repeat BMP in one week     Hospital Course: 77 year old male with a history of hypertension, hyperlipidemia, CKD stage IIIb, hypothyroidism, opioid dependence, asthma, and tobacco abuse presenting at the instruction of his PCP for acute on chronic renal failure.  The patient had a routine visit with his PCP on 07/13/2022.  Routine blood work was obtained at that time showed his serum creatinine up to 4.03.  The patient was called and instructed to go to the emergency department for further evaluation and treatment.  Notation was made that his valsartan was stopped on the day of that visit secondary to soft blood pressure. Notably, the patient follows Dr. Theador Hawthorne for his renal care.  Review the medical record shows that he had a visit with nephrology on 06/01/2022.  At that time, the patient was instructed to increase his torsemide to 100 mg daily and metolazone 5 mg two days per week was added. Since the change, patient states his weight has dropped from 284 to 276 lbs.  The patient himself has been feeling some generalized weakness and malaise and poor oral intake in the past few weeks prior to admission.  He denies any fevers, chills, headache, chest pain, shortness breath, cough, hemoptysis, nausea, vomiting, direct abdominal pain, dysuria, hematuria.  He did notice some decreased urine output in the past week.  He denies any NSAIDs.  In the ED, the patient was afebrile and hemodynamically stable albeit with soft blood pressures.  Oxygen saturation was 95-96% on room air.  WBC 9.7, hemoglobin 12.5, platelets 1-84,000.  Sodium 125, potassium 3.7, bicarbonate 26, BUN 90, creatinine 4.21.   AST 16, ALT 12, alk phos 66, total bilirubin 1.9, albumin 3.8.  UA was negative for any pyuria or microscopic hematuria.  EKG shows sinus rhythm with prolonged PR interval with nonspecific T wave changes.  The patient was started on IV fluids.  He was admitted for further evaluation and treatment of his renal failure.  Assessment and Plan: Acute on chronic renal failure--CKD3b -Baseline creatinine 1.7-2.0 -presented with serum creatinine 4.21 -Secondary to hemodynamic changes in the setting of torsemide and metolazone -Holding metolazone and torsemide temporarily -CT abdomen negative for hydronephrosis -Judicious IV fluids for the next 24 hours>>improving -07/15/22 CT abd--mild perinephric stranding without hydronephrosis -serum creatinine 2.87 on day of d/c -instructed pt to stop metolazone -appreciate nephrology consult -restart torsemide at 50 mg on 07/19/22 -follow up with Dr. Theador Hawthorne and/or PCP for repeat BMP in one week   Essential hypertension -Previously on valsartan which was discontinued on his visit on 07/13/2022 -Blood pressure has been soft   Opioid dependence -PDMP reviewed-- -patient receives hydrocodone 5/325, #60 monthly, last refill 06/09/2022 -Continue gabapentin at lower dose   Hyperlipidemia -Continue simvastatin -Check CPK--218   Hypothyroidism -Continue Synthroid   Hyponatremia -Secondary to acute kidney injury and metolazone -Monitor serial BMP   Hyperbilirubinemia -Fractionate bili--mostly indirect -LDH 134   Class II obesity -BMI 38.43 -Lifestyle modification   History of alcohol dependence--in remission -quit 3 years ago -drank approx ten x 20 oz beers daily for many years           Consultants: renal Procedures performed: none  Disposition:  Home Diet recommendation:  Cardiac diet DISCHARGE MEDICATION: Allergies as of 07/17/2022       Reactions   Codeine    Abdominal pain   Penicillins Rash   Childhood reaction         Medication List     STOP taking these medications    metolazone 5 MG tablet Commonly known as: ZAROXOLYN       TAKE these medications    Allergy Relief 10 MG tablet Generic drug: loratadine TAKE ONE TABLET BY MOUTH EVERY DAY   allopurinol 300 MG tablet Commonly known as: ZYLOPRIM TAKE ONE TABLET BY MOUTH DAILY   calcitRIOL 0.25 MCG capsule Commonly known as: ROCALTROL Take by mouth.   cholecalciferol 25 MCG (1000 UNIT) tablet Commonly known as: VITAMIN D3 Take 1,000 Units by mouth daily.   gabapentin 600 MG tablet Commonly known as: NEURONTIN TAKE ONE TABLET BY MOUTH THREE TIMES DAILY   HYDROcodone-acetaminophen 5-325 MG tablet Commonly known as: Norco Take 1 tablet by mouth every 12 (twelve) hours as needed for moderate pain. What changed: Another medication with the same name was removed. Continue taking this medication, and follow the directions you see here.   levothyroxine 112 MCG tablet Commonly known as: SYNTHROID TAKE ONE TABLET BY MOUTH DAILY   mometasone 50 MCG/ACT nasal spray Commonly known as: NASONEX INSTILL TWO SPRAYS INTO EACH NOSTRIL DAILY   montelukast 10 MG tablet Commonly known as: SINGULAIR TAKE ONE TABLET BY MOUTH AT BEDTIME   OMEGA-3 FISH OIL PO Take 1 capsule by mouth daily.   potassium chloride SA 20 MEQ tablet Commonly known as: KLOR-CON M Take 20 mEq by mouth 3 (three) times a week.   simvastatin 20 MG tablet Commonly known as: ZOCOR Take 1 tablet (20 mg total) by mouth 1 day or 1 dose for 360 doses. What changed: when to take this   torsemide 100 MG tablet Commonly known as: DEMADEX Take 0.5 tablets (50 mg total) by mouth daily. Start on 07/19/22 Start taking on: July 19, 2022 What changed:  how much to take additional instructions These instructions start on July 19, 2022. If you are unsure what to do until then, ask your doctor or other care provider.        Discharge Exam: Filed Weights   07/15/22 1035  07/15/22 1507  Weight: 125 kg 125.6 kg   HEENT:  [place]/AT, No thrush, no icterus CV:  RRR, no rub, no S3, no S4 Lung:  CTA, no wheeze, no rhonchi Abd:  soft/+BS, NT Ext:  No edema, no lymphangitis, no synovitis, no rash   Condition at discharge: stable  The results of significant diagnostics from this hospitalization (including imaging, microbiology, ancillary and laboratory) are listed below for reference.   Imaging Studies: CT ABDOMEN PELVIS WO CONTRAST  Result Date: 07/15/2022 CLINICAL DATA:  Abdominal pain, acute, nonlocalized. Acute renal failure, weakness. EXAM: CT ABDOMEN AND PELVIS WITHOUT CONTRAST TECHNIQUE: Multidetector CT imaging of the abdomen and pelvis was performed following the standard protocol without IV contrast. RADIATION DOSE REDUCTION: This exam was performed according to the departmental dose-optimization program which includes automated exposure control, adjustment of the mA and/or kV according to patient size and/or use of iterative reconstruction technique. COMPARISON:  Renal ultrasound 08/28/2019. Right upper quadrant ultrasound 08/23/2016. FINDINGS: Lower chest: Linear atelectasis or scarring in the lung bases. Bibasilar pleural thickening. No pericardial effusion. Atherosclerotic calcifications of the thoracic aorta. Hepatobiliary: No focal liver abnormality is seen. No gallstones, gallbladder wall thickening, or biliary dilatation.  Pancreas: By atrophy of the pancreas. No ductal dilatation or surrounding inflammation. Spleen: Normal in size without focal abnormality. Adrenals/Urinary Tract: Unremarkable adrenals. No renal masses or calculi. Mild bilateral perinephric stranding. No hydronephrosis. Bladder is unremarkable. Stomach/Bowel: Unremarkable stomach and duodenal. No dilated loops of small bowel. Appendix is not visualized. Sigmoid diverticulosis without evidence of acute diverticulitis. Redundant sigmoid colon. Vascular/Lymphatic: Atherosclerotic calcifications of  the abdominal aorta and its branches. No suspicious lymphadenopathy. Reproductive: Prostate is unremarkable. Other: No abdominal wall hernia or abnormality. No abdominopelvic ascites. Musculoskeletal: Lower lumbar spine degeneration. No suspicious bone lesions. IMPRESSION: 1. Mild perinephric stranding, nonspecific. No renal calculi or hydronephrosis. 2. Sigmoid diverticulosis without evidence of acute diverticulitis. Aortic Atherosclerosis (ICD10-I70.0). Electronically Signed   By: Emmit Alexanders M.D.   On: 07/15/2022 13:25    Microbiology: Results for orders placed or performed during the hospital encounter of 11/01/19  SARS CORONAVIRUS 2 ([place] 6-24 HRS) Nasopharyngeal Nasopharyngeal Swab     Status: None   Collection Time: 11/01/19 12:36 PM   Specimen: Nasopharyngeal Swab  Result Value Ref Range Status   SARS Coronavirus 2 NEGATIVE NEGATIVE Final    Comment: (NOTE) SARS-CoV-2 target nucleic acids are NOT DETECTED. The SARS-CoV-2 RNA is generally detectable in upper and lower respiratory specimens during the acute phase of infection. Negative results do not preclude SARS-CoV-2 infection, do not rule out co-infections with other pathogens, and should not be used as the sole basis for treatment or other patient management decisions. Negative results must be combined with clinical observations, patient history, and epidemiological information. The expected result is Negative. Fact Sheet for Patients: SugarRoll.be Fact Sheet for Healthcare Providers: https://www.woods-mathews.com/ This test is not yet approved or cleared by the [place] FDA and  has been authorized for detection and/or diagnosis of SARS-CoV-2 by FDA under an Emergency Use Authorization (EUA). This EUA will remain  in effect (meaning this test can be used) for the duration of the COVID-19 declaration under Section 56 4(b)(1) of the Act, 21 U.S.C. section 360bbb-3(b)(1), unless the  authorization is terminated or revoked sooner. Performed at [facility], [place] [address], [place], [place] [address]     Labs: CBC: Recent Labs  Lab 07/15/22 1036  WBC 9.7  NEUTROABS 7.0  HGB 12.5*  HCT 35.9*  MCV 89.3  PLT 062   Basic Metabolic Panel: Recent Labs  Lab 07/13/22 0917 07/15/22 1036 07/16/22 0400 07/17/22 0314  NA 131* 125* 128* 128*  K 4.3 3.7 3.6 3.5  CL 84* 84* 89* 93*  CO2 '26 26 26 24  '$ GLUCOSE 84 97 92 98  BUN 80* 90* 86* 72*  CREATININE 4.03* 4.21* 3.61* 2.87*  CALCIUM 9.5 8.6* 8.4* 8.8*  MG  --   --  2.1  --   PHOS  --   --   --  3.9   Liver Function Tests: Recent Labs  Lab 07/13/22 0917 07/15/22 1036 07/16/22 0400 07/17/22 0314  AST 14 16 14*  --   ALT '11 12 11  '$ --   ALKPHOS 84 66 60  --   BILITOT 1.1 1.7*  1.9* 1.3*  --   PROT 7.6 7.9 7.1  --   ALBUMIN 4.3 3.8 3.4* 3.3*   CBG: No results for input(s): "GLUCAP" in the last 168 hours.  Discharge time spent: greater than 30 minutes.  Signed: Orson Eva, MD [facility] 07/17/2022

## 2022-07-18 ENCOUNTER — Encounter: Payer: Self-pay | Admitting: Nephrology

## 2022-07-19 ENCOUNTER — Encounter: Payer: Self-pay | Admitting: *Deleted

## 2022-07-19 ENCOUNTER — Telehealth: Payer: Self-pay | Admitting: *Deleted

## 2022-07-19 NOTE — Patient Outreach (Signed)
  Care Coordination TOC Note Transition Care Management Follow-up Telephone Call Date of discharge and from where: [facility] on 07/17/22 How have you been since you were released from the hospital? "Oh I feel good. They got the swelling out of my feet while I was in there." Any questions or concerns? No  Items Reviewed: Did the pt receive and understand the discharge instructions provided? Yes  Medications obtained and verified? Yes . Reviewed medication changes and reconciled Other? Yes . Provided education on importance of daily weights each morning and to call nephrologist with any weight gain of >2 lbs overnight or >5 lbs in one week. Encouraged to check and record blood pressure twice daily and to take record to PCP and nephrology appt for review. Will need to repeat BMP in 1 week. PCP and nephrology office may call to reschedule appts.  Any new allergies since your discharge? No  Dietary orders reviewed? Yes Do you have support at home? Yes   Home Care and Equipment/Supplies: Were home health services ordered? no If so, what is the name of the agency?   Has the agency set up a time to come to the patient's home? not applicable Were any new equipment or medical supplies ordered?  No What is the name of the medical supply agency?  Were you able to get the supplies/equipment? not applicable Do you have any questions related to the use of the equipment or supplies? No  Functional Questionnaire: (I = Independent and D = Dependent) ADLs: I  Bathing/Dressing- I  Meal Prep- I  Eating- I  Maintaining continence- I  Transferring/Ambulation- I  Managing Meds- I  Follow up appointments reviewed:  PCP Hospital f/u appt confirmed? Yes  Scheduled to see Evelina Dun, FNP on 07/27/22 at 11:40. This is a 2 week f/u. Reached out to clinical staff via staff message advising of need for [facility] f/u and to call patient if this appt needs to be changed.  [facility] f/u appt confirmed?  Yes  Scheduled to see Dr Theador Hawthorne on 08/09/22 at 10:00. Outreached to advise of need for 1 week hosp f/u appt and repeat BMP.  Are transportation arrangements needed? No  If their condition worsens, is the pt aware to call PCP or go to the Emergency Dept.? Yes Was the patient provided with contact information for the PCP's office or ED? Yes Was to pt encouraged to call back with questions or concerns? Yes  SDOH assessments and interventions completed:   Yes SDOH Interventions Today    Flowsheet Row Most Recent Value  SDOH Interventions   Transportation Interventions Intervention Not Indicated  Financial Strain Interventions Intervention Not Indicated       Care Coordination Interventions:  Requested adjustments in existing PCP and Nephrology appts if needed. Other interventions outlined above.     Encounter Outcome:  Pt. Visit Completed    Chong Sicilian, BSN, RN-BC RN Care Coordinator [place] Direct Dial: [phone] Main #: [phone]

## 2022-07-21 ENCOUNTER — Other Ambulatory Visit: Payer: Self-pay | Admitting: Family

## 2022-07-21 DIAGNOSIS — I10 Essential (primary) hypertension: Secondary | ICD-10-CM

## 2022-07-21 DIAGNOSIS — N1832 Chronic kidney disease, stage 3b: Secondary | ICD-10-CM

## 2022-07-27 ENCOUNTER — Encounter: Payer: Self-pay | Admitting: Family

## 2022-07-27 ENCOUNTER — Ambulatory Visit (INDEPENDENT_AMBULATORY_CARE_PROVIDER_SITE_OTHER): Payer: 59 | Admitting: Family

## 2022-07-27 VITALS — BP 133/75 | HR 93 | Temp 97.8°F | Ht 71.0 in | Wt 270.0 lb

## 2022-07-27 DIAGNOSIS — N179 Acute kidney failure, unspecified: Secondary | ICD-10-CM

## 2022-07-27 DIAGNOSIS — E876 Hypokalemia: Secondary | ICD-10-CM | POA: Diagnosis not present

## 2022-07-27 DIAGNOSIS — Z09 Encounter for follow-up examination after completed treatment for conditions other than malignant neoplasm: Secondary | ICD-10-CM

## 2022-07-27 DIAGNOSIS — N1832 Chronic kidney disease, stage 3b: Secondary | ICD-10-CM | POA: Diagnosis not present

## 2022-07-27 DIAGNOSIS — I129 Hypertensive chronic kidney disease with stage 1 through stage 4 chronic kidney disease, or unspecified chronic kidney disease: Secondary | ICD-10-CM | POA: Diagnosis not present

## 2022-07-27 DIAGNOSIS — I10 Essential (primary) hypertension: Secondary | ICD-10-CM

## 2022-07-27 NOTE — Patient Instructions (Signed)
Chronic Kidney Disease, Adult Chronic kidney disease (CKD) occurs when the kidneys are slowly and permanently damaged over a long period of time. The kidneys are a pair of organs that do many important jobs in the body, including: Removing waste and extra fluid from the blood to make urine. Making hormones that maintain the amount of fluid in tissues and blood vessels. Maintaining the right amount of fluids and chemicals in the body. A small amount of kidney damage may not cause problems, but a large amount of damage may make it hard or impossible for the kidneys to work right. Steps must be taken to slow kidney damage or to stop it from getting worse. If steps are not taken, the kidneys may stop working permanently (end-stage renal disease, or ESRD). Most of the time, CKD does not go away, but it can often be controlled. People who have CKD are usually able to live full lives. What are the causes? The most common causes of this condition are diabetes and high blood pressure (hypertension). Other causes include: Cardiovascular diseases. These affect the heart and blood vessels. Kidney diseases. These include: Glomerulonephritis, or inflammation of the tiny filters in the kidneys. Interstitial nephritis. This is swelling of the small tubes of the kidneys and of the surrounding structures. Polycystic kidney disease, in which clusters of fluid-filled sacs form within the kidneys. Renal vascular disease. This includes disorders that affect the arteries and veins of the kidneys. Diseases that affect the body's defense system (immune system). A problem with urine flow. This may be caused by: Kidney stones. Cancer. An enlarged prostate, in males. A kidney infection or urinary tract infection (UTI) that keeps coming back. Vasculitis. This is swelling or inflammation of the blood vessels. What increases the risk? Your chances of having kidney disease increase with age. The following factors may make  you more likely to develop this condition: A family history of kidney disease or kidney failure. Kidney failure means the kidneys can no longer work right. Certain genetic diseases. Taking medicines often that are damaging to the kidneys. Being around or being in contact with toxic substances. Obesity. A history of tobacco use. What are the signs or symptoms? Symptoms of this condition include: Feeling very tired (lethargic) and having less energy. Swelling, or edema, of the face, legs, ankles, or feet. Nausea or vomiting, or loss of appetite. Confusion or trouble concentrating. Muscle twitches and cramps, especially in the legs. Dry, itchy skin. A metallic taste in the mouth. Producing less urine, or producing more urine (especially at night). Shortness of breath. Trouble sleeping. CKD may also result in not having enough red blood cells or hemoglobin in the blood (anemia) or having weak bones (bone disease). Symptoms develop slowly and may not be obvious until the kidney damage becomes severe. It is possible to have kidney disease for years without having symptoms. How is this diagnosed? This condition may be diagnosed based on: Blood tests. Urine tests. Imaging tests, such as an ultrasound or a CT scan. A kidney biopsy. This involves removing a sample of kidney tissue to be looked at under a microscope. Results from these tests will help to determine how serious the CKD is. How is this treated? There is no cure for most cases of this condition, but treatment usually relieves symptoms and prevents or slows the worsening of the disease. Treatment may include: Diet changes, which may require you to avoid alcohol and foods that are high in salt, potassium, phosphorous, and protein. Medicines. These may:  Lower blood pressure. Control blood sugar (glucose). Relieve anemia. Relieve swelling. Protect your bones. Improve the balance of salts and minerals in your blood  (electrolytes). Dialysis, which is a type of treatment that removes toxic waste from the body. It may be needed if you have kidney failure. Managing any other conditions that are causing your CKD or making it worse. Follow these instructions at home: Medicines Take over-the-counter and prescription medicines only as told by your health care provider. The amount of some medicines that you take may need to be changed. Do not take any new medicines unless approved by your health care provider. Many medicines can make kidney damage worse. Do not take any vitamin and mineral supplements unless approved by your health care provider. Many nutritional supplements can make kidney damage worse. Lifestyle  Do not use any products that contain nicotine or tobacco, such as cigarettes, e-cigarettes, and chewing tobacco. If you need help quitting, ask your health care provider. If you drink alcohol: Limit how much you use to: 0-1 drink a day for women who are not pregnant. 0-2 drinks a day for men. Know how much alcohol is in your drink. In the [place] one drink equals one 12 oz bottle of beer (355 mL), one 5 oz glass of wine (148 mL), or one 1 oz glass of hard liquor (44 mL). Maintain a healthy weight. If you need help, ask your health care provider. General instructions  Follow instructions from your health care provider about eating or drinking restrictions, including any prescribed diet. Track your blood pressure at home. Report changes in your blood pressure as told. If you are being treated for diabetes, track your blood glucose levels as told. Start or continue an exercise plan. Exercise at least 30 minutes a day, 5 days a week. Keep your immunizations up to date as told. Keep all follow-up visits. This is important. Where to find more information American Association of Kidney Patients: BombTimer.gl National Kidney Foundation: www.kidney.[place]: https://mathis.com/ Life Options:  www.lifeoptions.org Kidney School: www.kidneyschool.org Contact a health care provider if: Your symptoms get worse. You develop new symptoms. Get help right away if: You develop symptoms of ESRD. These include: Headaches. Numbness in your hands or feet. Easy bruising. Frequent hiccups. Chest pain. Shortness of breath. Lack of menstrual periods, in women. You have a fever. You are producing less urine than usual. You have pain or bleeding when you urinate or when you have a bowel movement. These symptoms may represent a serious problem that is an emergency. Do not wait to see if the symptoms will go away. Get medical help right away. Call your local emergency services (911 in the [place] Do not drive yourself to the hospital. Summary Chronic kidney disease (CKD) occurs when the kidneys become damaged slowly over a long period of time. The most common causes of this condition are diabetes and high blood pressure (hypertension). There is no cure for most cases of CKD, but treatment usually relieves symptoms and prevents or slows the worsening of the disease. Treatment may include a combination of lifestyle changes, medicines, and dialysis. This information is not intended to replace advice given to you by your health care provider. Make sure you discuss any questions you have with your health care provider. Document Revised: 09/12/2019 Document Reviewed: 09/12/2019 Elsevier Patient Education  [place].

## 2022-07-27 NOTE — Progress Notes (Addendum)
Subjective:    Patient ID: Andrew Phelps, male    DOB: April 27, 1946, 77 y.o.   MRN: [id number]  Chief Complaint  Patient presents with   Hospitalization Follow-up   Today's visit was for Transitional Care Management.  The patient was discharged from [facility] on 07/17/22 with a primary diagnosis of Acute renal failure.   Contact with the patient and/or caregiver, by a clinical staff member, was made on 07/19/22 and was documented as a telephone encounter within the EMR.  Through chart review and discussion with the patient I have determined that management of their condition is of moderate complexity.    Pt presents to the office today for hospital follow up. He was seen in office on 07/13/22 and found to have a creatinine of 4.03. He was sent to ED. They held his metolazone 5 mg. His demedex was decreased to 50 mg from 100 mg. He has lost 6 lbs since hospitalization.      07/27/2022   11:47 AM 07/15/2022    3:07 PM 07/15/2022   10:35 AM  Last 3 Weights  Weight (lbs) 270 lb 276 lb 14.4 oz 275 lb 9.2 oz  Weight (kg) 122.471 kg 125.6 kg 125 kg     Hypertension This is a chronic problem. The current episode started more than 1 year ago. The problem has been resolved since onset. The problem is controlled. Associated symptoms include peripheral edema. Pertinent negatives include no malaise/fatigue or shortness of breath. Risk factors for coronary artery disease include dyslipidemia, obesity, male gender and sedentary lifestyle. Past treatments include diuretics. The current treatment provides moderate improvement.      Review of Systems  Constitutional:  Negative for malaise/fatigue.  Respiratory:  Negative for shortness of breath.   All other systems reviewed and are negative.      Objective:   Physical Exam Vitals reviewed.  Constitutional:      General: He is not in acute distress.    Appearance: He is well-developed. He is obese.  HENT:     Head: Normocephalic.   Eyes:     General:        Right eye: No discharge.        Left eye: No discharge.     Pupils: Pupils are equal, round, and reactive to light.  Neck:     Thyroid: No thyromegaly.  Cardiovascular:     Rate and Rhythm: Normal rate and regular rhythm.     Heart sounds: Normal heart sounds. No murmur heard. Pulmonary:     Effort: Pulmonary effort is normal. No respiratory distress.     Breath sounds: Normal breath sounds. No wheezing.  Abdominal:     General: Bowel sounds are normal. There is no distension.     Palpations: Abdomen is soft.     Tenderness: There is no abdominal tenderness.  Musculoskeletal:        General: No tenderness. Normal range of motion.     Cervical back: Normal range of motion and neck supple.     Right lower leg: Edema (2+ ankles) present.     Left lower leg: Edema (2+ ankles) present.  Skin:    General: Skin is warm and dry.     Findings: No erythema or rash.  Neurological:     Mental Status: He is alert and oriented to person, place, and time.     Cranial Nerves: No cranial nerve deficit.     Deep Tendon Reflexes: Reflexes are normal and symmetric.  Psychiatric:        Behavior: Behavior normal.        Thought Content: Thought content normal.        Judgment: Judgment normal.       BP 133/75   Pulse 93   Temp 97.8 F (36.6 C) (Temporal)   Ht 5' 11"$  (1.803 m)   Wt 270 lb (122.5 kg)   SpO2 97%   BMI 37.66 kg/m      Assessment & Plan:  Andrew Phelps comes in today with chief complaint of Hospitalization Follow-up   Diagnosis and orders addressed:  1. Acute renal failure superimposed on stage 3b chronic kidney disease, unspecified acute renal failure type ([place]) - CMP14+EGFR  2. Hospital discharge follow-up - CMP14+EGFR  3. Hypokalemia - CMP14+EGFR  4. Essential hypertension - CMP14+EGFR   Labs pending Continue demadex 50 mg  Weigh daily Low salt diet Keep Nephrologists appt  Health Maintenance reviewed Diet and  exercise encouraged  Follow up plan: 3 months    Evelina Dun, FNP

## 2022-07-28 LAB — CMP14+EGFR
ALT: 18 IU/L (ref 0–44)
AST: 18 IU/L (ref 0–40)
Albumin/Globulin Ratio: 1.2 (ref 1.2–2.2)
Albumin: 4.1 g/dL (ref 3.8–4.8)
Alkaline Phosphatase: 87 IU/L (ref 44–121)
BUN/Creatinine Ratio: 14 (ref 10–24)
BUN: 28 mg/dL — ABNORMAL HIGH (ref 8–27)
Bilirubin Total: 1.5 mg/dL — ABNORMAL HIGH (ref 0.0–1.2)
CO2: 26 mmol/L (ref 20–29)
Calcium: 9.3 mg/dL (ref 8.6–10.2)
Chloride: 92 mmol/L — ABNORMAL LOW (ref 96–106)
Creatinine, Ser: 2.05 mg/dL — ABNORMAL HIGH (ref 0.76–1.27)
Globulin, Total: 3.4 g/dL (ref 1.5–4.5)
Glucose: 98 mg/dL (ref 70–99)
Potassium: 4 mmol/L (ref 3.5–5.2)
Sodium: 136 mmol/L (ref 134–144)
Total Protein: 7.5 g/dL (ref 6.0–8.5)
eGFR: 33 mL/min/{1.73_m2} — ABNORMAL LOW (ref >59)

## 2022-08-03 DIAGNOSIS — R600 Localized edema: Secondary | ICD-10-CM | POA: Diagnosis not present

## 2022-08-03 DIAGNOSIS — I129 Hypertensive chronic kidney disease with stage 1 through stage 4 chronic kidney disease, or unspecified chronic kidney disease: Secondary | ICD-10-CM | POA: Diagnosis not present

## 2022-08-03 DIAGNOSIS — N1832 Chronic kidney disease, stage 3b: Secondary | ICD-10-CM | POA: Diagnosis not present

## 2022-08-03 DIAGNOSIS — E211 Secondary hyperparathyroidism, not elsewhere classified: Secondary | ICD-10-CM | POA: Diagnosis not present

## 2022-08-06 NOTE — Addendum Note (Signed)
Addended by: Evelina Dun A on: 08/06/2022 12:21 PM   Modules accepted: Level of Service

## 2022-08-09 DIAGNOSIS — E871 Hypo-osmolality and hyponatremia: Secondary | ICD-10-CM | POA: Diagnosis not present

## 2022-08-09 DIAGNOSIS — I129 Hypertensive chronic kidney disease with stage 1 through stage 4 chronic kidney disease, or unspecified chronic kidney disease: Secondary | ICD-10-CM | POA: Diagnosis not present

## 2022-08-09 DIAGNOSIS — R600 Localized edema: Secondary | ICD-10-CM | POA: Diagnosis not present

## 2022-08-09 DIAGNOSIS — N170 Acute kidney failure with tubular necrosis: Secondary | ICD-10-CM | POA: Diagnosis not present

## 2022-08-09 DIAGNOSIS — E211 Secondary hyperparathyroidism, not elsewhere classified: Secondary | ICD-10-CM | POA: Diagnosis not present

## 2022-08-09 DIAGNOSIS — N1832 Chronic kidney disease, stage 3b: Secondary | ICD-10-CM | POA: Diagnosis not present

## 2022-08-24 LAB — HM DIABETES EYE EXAM

## 2022-09-03 DIAGNOSIS — H905 Unspecified sensorineural hearing loss: Secondary | ICD-10-CM | POA: Diagnosis not present

## 2022-09-16 ENCOUNTER — Other Ambulatory Visit: Payer: Self-pay | Admitting: Family

## 2022-09-16 DIAGNOSIS — E785 Hyperlipidemia, unspecified: Secondary | ICD-10-CM

## 2022-09-29 DIAGNOSIS — R600 Localized edema: Secondary | ICD-10-CM | POA: Diagnosis not present

## 2022-09-29 DIAGNOSIS — D519 Vitamin B12 deficiency anemia, unspecified: Secondary | ICD-10-CM | POA: Diagnosis not present

## 2022-09-29 DIAGNOSIS — N1832 Chronic kidney disease, stage 3b: Secondary | ICD-10-CM | POA: Diagnosis not present

## 2022-09-29 DIAGNOSIS — I129 Hypertensive chronic kidney disease with stage 1 through stage 4 chronic kidney disease, or unspecified chronic kidney disease: Secondary | ICD-10-CM | POA: Diagnosis not present

## 2022-09-29 DIAGNOSIS — E211 Secondary hyperparathyroidism, not elsewhere classified: Secondary | ICD-10-CM | POA: Diagnosis not present

## 2022-09-30 ENCOUNTER — Encounter: Payer: Self-pay | Admitting: *Deleted

## 2022-10-06 DIAGNOSIS — E211 Secondary hyperparathyroidism, not elsewhere classified: Secondary | ICD-10-CM | POA: Diagnosis not present

## 2022-10-06 DIAGNOSIS — R600 Localized edema: Secondary | ICD-10-CM | POA: Diagnosis not present

## 2022-10-06 DIAGNOSIS — N1832 Chronic kidney disease, stage 3b: Secondary | ICD-10-CM | POA: Diagnosis not present

## 2022-10-06 DIAGNOSIS — I129 Hypertensive chronic kidney disease with stage 1 through stage 4 chronic kidney disease, or unspecified chronic kidney disease: Secondary | ICD-10-CM | POA: Diagnosis not present

## 2022-10-11 ENCOUNTER — Ambulatory Visit (INDEPENDENT_AMBULATORY_CARE_PROVIDER_SITE_OTHER): Payer: 59 | Admitting: Family

## 2022-10-11 ENCOUNTER — Encounter: Payer: Self-pay | Admitting: Family

## 2022-10-11 VITALS — BP 148/76 | HR 79 | Temp 97.5°F | Ht 71.0 in | Wt 287.0 lb

## 2022-10-11 DIAGNOSIS — J45909 Unspecified asthma, uncomplicated: Secondary | ICD-10-CM

## 2022-10-11 DIAGNOSIS — F1120 Opioid dependence, uncomplicated: Secondary | ICD-10-CM

## 2022-10-11 DIAGNOSIS — Z0000 Encounter for general adult medical examination without abnormal findings: Secondary | ICD-10-CM

## 2022-10-11 DIAGNOSIS — M1990 Unspecified osteoarthritis, unspecified site: Secondary | ICD-10-CM

## 2022-10-11 DIAGNOSIS — M545 Low back pain, unspecified: Secondary | ICD-10-CM | POA: Diagnosis not present

## 2022-10-11 DIAGNOSIS — M1711 Unilateral primary osteoarthritis, right knee: Secondary | ICD-10-CM

## 2022-10-11 DIAGNOSIS — I10 Essential (primary) hypertension: Secondary | ICD-10-CM

## 2022-10-11 DIAGNOSIS — E871 Hypo-osmolality and hyponatremia: Secondary | ICD-10-CM

## 2022-10-11 DIAGNOSIS — E039 Hypothyroidism, unspecified: Secondary | ICD-10-CM

## 2022-10-11 DIAGNOSIS — D649 Anemia, unspecified: Secondary | ICD-10-CM

## 2022-10-11 DIAGNOSIS — Z0289 Encounter for other administrative examinations: Secondary | ICD-10-CM

## 2022-10-11 DIAGNOSIS — G4700 Insomnia, unspecified: Secondary | ICD-10-CM

## 2022-10-11 DIAGNOSIS — Z0001 Encounter for general adult medical examination with abnormal findings: Secondary | ICD-10-CM

## 2022-10-11 DIAGNOSIS — G6289 Other specified polyneuropathies: Secondary | ICD-10-CM

## 2022-10-11 DIAGNOSIS — G8929 Other chronic pain: Secondary | ICD-10-CM | POA: Diagnosis not present

## 2022-10-11 DIAGNOSIS — E785 Hyperlipidemia, unspecified: Secondary | ICD-10-CM

## 2022-10-11 DIAGNOSIS — Z6841 Body Mass Index (BMI) 40.0 and over, adult: Secondary | ICD-10-CM

## 2022-10-11 DIAGNOSIS — N1832 Chronic kidney disease, stage 3b: Secondary | ICD-10-CM | POA: Diagnosis not present

## 2022-10-11 MED ORDER — HYDROCODONE-ACETAMINOPHEN 5-325 MG PO TABS: 1.0000 | ORAL_TABLET | Freq: Two times a day (BID) | ORAL | 0 refills | Status: DC | PRN

## 2022-10-11 NOTE — Patient Instructions (Addendum)
Peripheral Edema  Peripheral edema is swelling that is caused by a buildup of fluid. Peripheral edema most often affects the lower legs, ankles, and feet. It can also develop in the arms, hands, and face. The area of the body that has peripheral edema will look swollen. It may also feel heavy or warm. Your clothes may start to feel tight. Pressing on the area may make a temporary dent in your skin (pitting edema). You may not be able to move your swollen arm or leg as much as usual. There are many causes of peripheral edema. It can happen because of a complication of other conditions such as heart failure, kidney disease, or a problem with your circulation. It also can be a side effect of certain medicines or happen because of an infection. It often happens to women during pregnancy. Sometimes, the cause is not known. Follow these instructions at home: Managing pain, stiffness, and swelling  Raise (elevate) your legs while you are sitting or lying down. Move around often to prevent stiffness and to reduce swelling. Do not sit or stand for long periods of time. Do not wear tight clothing. Do not wear garters on your upper legs. Exercise your legs to get your circulation going. This helps to move the fluid back into your blood vessels, and it may help the swelling go down. Wear compression stockings as told by your health care provider. These stockings help to prevent blood clots and reduce swelling in your legs. It is important that these are the correct size. These stockings should be prescribed by your doctor to prevent possible injuries. If elastic bandages or wraps are recommended, use them as told by your health care provider. Medicines Take over-the-counter and prescription medicines only as told by your health care provider. Your health care provider may prescribe medicine to help your body get rid of excess water (diuretic). Take this medicine if you are told to take it. General  instructions Eat a low-salt (low-sodium) diet as told by your health care provider. Sometimes, eating less salt may reduce swelling. Pay attention to any changes in your symptoms. Moisturize your skin daily to help prevent skin from cracking and draining. Keep all follow-up visits. This is important. Contact a health care provider if: You have a fever. You have swelling in only one leg. You have increased swelling, redness, or pain in one or both of your legs. You have drainage or sores at the area where you have edema. Get help right away if: You have edema that starts suddenly or is getting worse, especially if you are pregnant or have a medical condition. You develop shortness of breath, especially when you are lying down. You have pain in your chest or abdomen. You feel weak. You feel like you will faint. These symptoms may be an emergency. Get help right away. Call 911. Do not wait to see if the symptoms will go away. Do not drive yourself to the hospital. Summary Peripheral edema is swelling that is caused by a buildup of fluid. Peripheral edema most often affects the lower legs, ankles, and feet. Move around often to prevent stiffness and to reduce swelling. Do not sit or stand for long periods of time. Pay attention to any changes in your symptoms. Contact a health care provider if you have edema that starts suddenly or is getting worse, especially if you are pregnant or have a medical condition. Get help right away if you develop shortness of breath, especially when lying down.   This information is not intended to replace advice given to you by your health care provider. Make sure you discuss any questions you have with your health care provider. Document Revised: 02/09/2021 Document Reviewed: 02/09/2021 Elsevier Patient Education  2023 Elsevier Inc.  

## 2022-10-11 NOTE — Progress Notes (Signed)
Subjective:    Patient ID: Andrew Phelps, male    DOB: [birth year], 77 y.o.   MRN: [id number]  Chief Complaint  Patient presents with   Medical Management of Chronic Issues    Swelling in lower legs and feet. Went to kidney doctor last week   PT presents the office today for CPE and  chronic follow up and pain medication refill. PT is followed by Nephrologists every 2 months for CKD.  He has peripheral edema. He was seen on 09/26/22  and her demadex 100 mg one day  and 50 mg the other. This was increased to 100 mg daily. He has increased peripheral edema and mild SOB.    He is considered morbid obese with a BMI of 40 and co morbidity of HTN and CKD.   Hypertension This is a chronic problem. The current episode started more than 1 year ago. The problem has been waxing and waning since onset. The problem is uncontrolled. Associated symptoms include malaise/fatigue, peripheral edema and shortness of breath (with distance). Risk factors for coronary artery disease include dyslipidemia, diabetes mellitus, obesity and male gender. The current treatment provides moderate improvement. Hypertensive end-organ damage includes heart failure. Identifiable causes of hypertension include a thyroid problem.  Asthma He complains of shortness of breath (with distance). There is no cough, hoarse voice or wheezing. This is a chronic problem. The current episode started more than 1 year ago. The problem occurs intermittently. The problem has been waxing and waning. Associated symptoms include malaise/fatigue. He reports moderate improvement on treatment. His past medical history is significant for asthma.  Thyroid Problem Presents for follow-up visit. Symptoms include fatigue. Patient reports no anxiety, depressed mood or hoarse voice. The symptoms have been stable. His past medical history is significant for heart failure and hyperlipidemia.  Arthritis Presents for follow-up visit. He complains of pain and  stiffness. The symptoms have been stable. Affected locations include the left knee and right knee (back). His pain is at a severity of 9/10. Associated symptoms include fatigue.  Insomnia Primary symptoms: difficulty falling asleep, frequent awakening, malaise/fatigue.   The current episode started more than one year. The onset quality is gradual. The problem occurs intermittently.  Hyperlipidemia This is a chronic problem. The current episode started more than 1 year ago. The problem is controlled. Recent lipid tests were reviewed and are normal. Exacerbating diseases include obesity. Associated symptoms include shortness of breath (with distance). Current antihyperlipidemic treatment includes statins. The current treatment provides moderate improvement of lipids. Risk factors for coronary artery disease include diabetes mellitus, dyslipidemia, hypertension, male sex and a sedentary lifestyle.  Back Pain This is a chronic problem. The current episode started more than 1 year ago. The problem occurs intermittently. The problem has been waxing and waning since onset. The pain is present in the lumbar spine. The quality of the pain is described as aching. The pain is at a severity of 9/10. The pain is moderate. Risk factors include obesity. He has tried analgesics for the symptoms. The treatment provided moderate relief.  Anemia Presents for follow-up visit. Symptoms include malaise/fatigue. Past medical history includes heart failure.      Review of Systems  Constitutional:  Positive for fatigue and malaise/fatigue.  HENT:  Negative for hoarse voice.   Respiratory:  Positive for shortness of breath (with distance). Negative for cough and wheezing.   Musculoskeletal:  Positive for arthritis, back pain and stiffness.  Psychiatric/Behavioral:  The patient has insomnia. The patient is not  nervous/anxious.   All other systems reviewed and are negative.  Family History  Problem Relation Age of Onset    Heart disease Mother    Heart disease Father    Hypertension Brother    Hyperlipidemia Brother    Heart disease Sister    Heart disease Brother    Heart disease Brother    Cancer Brother    Colon cancer Neg Hx    Liver disease Neg Hx    Social History   Socioeconomic History   Marital status: Divorced    Spouse name: Not on file   Number of children: 3   Years of education: 8    Highest education level: 8th grade  Occupational History   Occupation: Retired  Tobacco Use   Smoking status: Former    Packs/day: 2.00    Years: 25.00    Additional pack years: 0.00    Total pack years: 50.00    Types: Cigarettes    Quit date: 07/06/1995    Years since quitting: 27.2   Smokeless tobacco: Current    Types: Chew  Vaping Use   Vaping Use: Never used  Substance and Sexual Activity   Alcohol use: Not Currently    Comment: stopped 3 months ago   Drug use: No   Sexual activity: Yes  Other Topics Concern   Not on file  Social History Narrative   Roommate 85 years old   Spends lots of time with brother and children   Social Determinants of Health   Financial Resource Strain: Low Risk  (07/19/2022)   Overall Financial Resource Strain (CARDIA)    Difficulty of Paying Living Expenses: Not hard at all  Food Insecurity: No Food Insecurity (07/15/2022)   Hunger Vital Sign    Worried About Running Out of Food in the Last Year: Never true    Ran Out of Food in the Last Year: Never true  Transportation Needs: No Transportation Needs (07/19/2022)   PRAPARE - Administrator, Civil Service (Medical): No    Lack of Transportation (Non-Medical): No  Physical Activity: Insufficiently Active (10/09/2021)   Exercise Vital Sign    Days of Exercise per Week: 7 days    Minutes of Exercise per Session: 20 min  Stress: No Stress Concern Present (10/09/2021)   [employer] of Occupational Health - Occupational Stress Questionnaire    Feeling of Stress : Only a little  Social  Connections: Socially Isolated (10/09/2021)   Social Connection and Isolation Panel [NHANES]    Frequency of Communication with Friends and Family: More than three times a week    Frequency of Social Gatherings with Friends and Family: More than three times a week    Attends Religious Services: Never    Database administrator or Organizations: No    Attends Banker Meetings: Never    Marital Status: Divorced       Objective:   Physical Exam Vitals reviewed.  Constitutional:      General: He is not in acute distress.    Appearance: He is well-developed. He is obese.  HENT:     Head: Normocephalic.     Right Ear: Tympanic membrane normal.     Left Ear: Tympanic membrane normal.  Eyes:     General:        Right eye: No discharge.        Left eye: No discharge.     Pupils: Pupils are equal, round, and reactive to light.  Neck:     Thyroid: No thyromegaly.  Cardiovascular:     Rate and Rhythm: Normal rate and regular rhythm.     Heart sounds: Normal heart sounds. No murmur heard. Pulmonary:     Effort: Pulmonary effort is normal. No respiratory distress.     Breath sounds: Normal breath sounds. No wheezing.  Abdominal:     General: Bowel sounds are normal. There is no distension.     Palpations: Abdomen is soft.     Tenderness: There is no abdominal tenderness.  Musculoskeletal:        General: No tenderness. Normal range of motion.     Cervical back: Normal range of motion and neck supple.     Right lower leg: Edema (4+) present.     Left lower leg: Edema (4+) present.  Skin:    General: Skin is warm and dry.     Findings: No erythema or rash.  Neurological:     Mental Status: He is alert and oriented to person, place, and time.     Cranial Nerves: No cranial nerve deficit.     Deep Tendon Reflexes: Reflexes are normal and symmetric.  Psychiatric:        Behavior: Behavior normal.        Thought Content: Thought content normal.        Judgment: Judgment  normal.      BP (!) 148/76   Pulse 79   Temp (!) 97.5 F (36.4 C) (Temporal)   Ht  (1.803 m)   Wt 287 lb (130.2 kg)   SpO2 98%   BMI 40.03 kg/m       Assessment & Plan:  Andrew Phelps comes in today with chief complaint of Medical Management of Chronic Issues (Swelling in lower legs and feet. Went to kidney doctor last week)   Diagnosis and orders addressed:  1. Arthritis - HYDROcodone-acetaminophen (NORCO) 5-325 MG tablet; Take 1 tablet by mouth every 12 (twelve) hours as needed for moderate pain.  Dispense: 60 tablet; Refill: 0 - ToxASSURE Select 13 (MW), Urine - HYDROcodone-acetaminophen (NORCO) 5-325 MG tablet; Take 1 tablet by mouth every 12 (twelve) hours as needed for moderate pain.  Dispense: 60 tablet; Refill: 0 - HYDROcodone-acetaminophen (NORCO) 5-325 MG tablet; Take 1 tablet by mouth every 12 (twelve) hours as needed for moderate pain.  Dispense: 60 tablet; Refill: 0 - CMP14+EGFR - CBC with Differential/Platelet  2. Pain medication agreement signed - HYDROcodone-acetaminophen (NORCO) 5-325 MG tablet; Take 1 tablet by mouth every 12 (twelve) hours as needed for moderate pain.  Dispense: 60 tablet; Refill: 0 - ToxASSURE Select 13 (MW), Urine - HYDROcodone-acetaminophen (NORCO) 5-325 MG tablet; Take 1 tablet by mouth every 12 (twelve) hours as needed for moderate pain.  Dispense: 60 tablet; Refill: 0 - HYDROcodone-acetaminophen (NORCO) 5-325 MG tablet; Take 1 tablet by mouth every 12 (twelve) hours as needed for moderate pain.  Dispense: 60 tablet; Refill: 0 - CMP14+EGFR - CBC with Differential/Platelet  3. Uncomplicated opioid dependence - HYDROcodone-acetaminophen (NORCO) 5-325 MG tablet; Take 1 tablet by mouth every 12 (twelve) hours as needed for moderate pain.  Dispense: 60 tablet; Refill: 0 - ToxASSURE Select 13 (MW), Urine - HYDROcodone-acetaminophen (NORCO) 5-325 MG tablet; Take 1 tablet by mouth every 12 (twelve) hours as needed for moderate  pain.  Dispense: 60 tablet; Refill: 0 - HYDROcodone-acetaminophen (NORCO) 5-325 MG tablet; Take 1 tablet by mouth every 12 (twelve) hours as needed for moderate pain.  Dispense: 60 tablet; Refill:  0 - CMP14+EGFR - CBC with Differential/Platelet  4. Chronic bilateral low back pain without sciatica - HYDROcodone-acetaminophen (NORCO) 5-325 MG tablet; Take 1 tablet by mouth every 12 (twelve) hours as needed for moderate pain.  Dispense: 60 tablet; Refill: 0 - ToxASSURE Select 13 (MW), Urine - HYDROcodone-acetaminophen (NORCO) 5-325 MG tablet; Take 1 tablet by mouth every 12 (twelve) hours as needed for moderate pain.  Dispense: 60 tablet; Refill: 0 - HYDROcodone-acetaminophen (NORCO) 5-325 MG tablet; Take 1 tablet by mouth every 12 (twelve) hours as needed for moderate pain.  Dispense: 60 tablet; Refill: 0 - CMP14+EGFR - CBC with Differential/Platelet  5. Anemia, unspecified type - CMP14+EGFR - CBC with Differential/Platelet  6. Chronic asthma without complication, unspecified asthma severity, unspecified whether persistent - CMP14+EGFR - CBC with Differential/Platelet  7. Stage 3b chronic kidney disease - CMP14+EGFR - CBC with Differential/Platelet  8. Essential hypertension - CMP14+EGFR - CBC with Differential/Platelet  9. Hyponatremia - CMP14+EGFR - CBC with Differential/Platelet  10. Hyperlipidemia, unspecified hyperlipidemia type - CMP14+EGFR - CBC with Differential/Platelet - Lipid panel  11. Unilateral primary osteoarthritis, right knee - CMP14+EGFR - CBC with Differential/Platelet  12. Other polyneuropathy - CMP14+EGFR - CBC with Differential/Platelet  13. Morbid obesity - CMP14+EGFR - CBC with Differential/Platelet  14. Insomnia, unspecified type - CMP14+EGFR - CBC with Differential/Platelet  15. Hypothyroidism, unspecified type - CMP14+EGFR - CBC with Differential/Platelet - TSH  16. Annual physical exam - CMP14+EGFR - CBC with  Differential/Platelet - Lipid panel - TSH   Labs pending Patient reviewed in [place] controlled database, no flags noted. Contract and drug screen are up to date.  Health Maintenance reviewed Diet and exercise encouraged  Follow up plan: 3 months    Jannifer Rodney, FNP

## 2022-10-12 LAB — CBC WITH DIFFERENTIAL/PLATELET
Basophils Absolute: 0.1 10*3/uL (ref 0.0–0.2)
Basos: 1 %
EOS (ABSOLUTE): 0.3 10*3/uL (ref 0.0–0.4)
Eos: 3 %
Hematocrit: 42.5 % (ref 37.5–51.0)
Hemoglobin: 14.2 g/dL (ref 13.0–17.7)
Immature Grans (Abs): 0.1 10*3/uL (ref 0.0–0.1)
Immature Granulocytes: 1 %
Lymphocytes Absolute: 2.1 10*3/uL (ref 0.7–3.1)
Lymphs: 20 %
MCH: 30.5 pg (ref 26.6–33.0)
MCHC: 33.4 g/dL (ref 31.5–35.7)
MCV: 91 fL (ref 79–97)
Monocytes Absolute: 0.6 10*3/uL (ref 0.1–0.9)
Monocytes: 5 %
Neutrophils Absolute: 7.4 10*3/uL — ABNORMAL HIGH (ref 1.4–7.0)
Neutrophils: 70 %
Platelets: 197 10*3/uL (ref 150–450)
RBC: 4.65 x10E6/uL (ref 4.14–5.80)
RDW: 13.6 % (ref 11.6–15.4)
WBC: 10.5 10*3/uL (ref 3.4–10.8)

## 2022-10-12 LAB — LIPID PANEL
Chol/HDL Ratio: 2.8 ratio (ref 0.0–5.0)
Cholesterol, Total: 122 mg/dL (ref 100–199)
HDL: 43 mg/dL (ref >39)
LDL Chol Calc (NIH): 60 mg/dL (ref 0–99)
Triglycerides: 104 mg/dL (ref 0–149)
VLDL Cholesterol Cal: 19 mg/dL (ref 5–40)

## 2022-10-12 LAB — CMP14+EGFR
ALT: 11 IU/L (ref 0–44)
AST: 20 IU/L (ref 0–40)
Albumin/Globulin Ratio: 1.3 (ref 1.2–2.2)
Albumin: 4.1 g/dL (ref 3.8–4.8)
Alkaline Phosphatase: 111 IU/L (ref 44–121)
BUN/Creatinine Ratio: 6 — ABNORMAL LOW (ref 10–24)
BUN: 12 mg/dL (ref 8–27)
Bilirubin Total: 1.2 mg/dL (ref 0.0–1.2)
CO2: 25 mmol/L (ref 20–29)
Calcium: 8.9 mg/dL (ref 8.6–10.2)
Chloride: 96 mmol/L (ref 96–106)
Creatinine, Ser: 1.86 mg/dL — ABNORMAL HIGH (ref 0.76–1.27)
Globulin, Total: 3.1 g/dL (ref 1.5–4.5)
Glucose: 89 mg/dL (ref 70–99)
Potassium: 4.1 mmol/L (ref 3.5–5.2)
Sodium: 141 mmol/L (ref 134–144)
Total Protein: 7.2 g/dL (ref 6.0–8.5)
eGFR: 38 mL/min/{1.73_m2} — ABNORMAL LOW (ref >59)

## 2022-10-12 LAB — TSH: TSH: 1.34 u[IU]/mL (ref 0.450–4.500)

## 2022-10-14 ENCOUNTER — Telehealth: Payer: Self-pay | Admitting: Family

## 2022-10-14 LAB — TOXASSURE SELECT 13 (MW), URINE

## 2022-10-14 NOTE — Telephone Encounter (Signed)
Contacted Madelyn Flavors Fjelstad to schedule their annual wellness visit. Appointment made for 10/18/2022.  Thank you,  Judeth Cornfield,  AMB Clinical Support [facility] AWV Program Direct Dial ??[phone]

## 2022-10-16 ENCOUNTER — Other Ambulatory Visit: Payer: Self-pay | Admitting: Family

## 2022-10-16 DIAGNOSIS — G6289 Other specified polyneuropathies: Secondary | ICD-10-CM

## 2022-10-16 DIAGNOSIS — I10 Essential (primary) hypertension: Secondary | ICD-10-CM

## 2022-10-18 ENCOUNTER — Ambulatory Visit (INDEPENDENT_AMBULATORY_CARE_PROVIDER_SITE_OTHER): Payer: 59

## 2022-10-18 VITALS — Ht 71.0 in | Wt 287.0 lb

## 2022-10-18 DIAGNOSIS — Z0000 Encounter for general adult medical examination without abnormal findings: Secondary | ICD-10-CM

## 2022-10-18 NOTE — Patient Instructions (Signed)
Andrew Phelps , Thank you for taking time to come for your Medicare Wellness Visit. I appreciate your ongoing commitment to your health goals. Please review the following plan we discussed and let me know if I can assist you in the future.   These are the goals we discussed:  Goals      Remain active and independent        This is a list of the screening recommended for you and due dates:  Health Maintenance  Topic Date Due   COVID-19 Vaccine (4 - 2023-24 season) 10/27/2022*   Colon Cancer Screening  11/05/2022   Flu Shot  01/20/2023   DTaP/Tdap/Td vaccine (2 - Td or Tdap) 10/12/2023   Medicare Annual Wellness Visit  10/18/2023   Pneumonia Vaccine  Completed   Hepatitis C Screening: USPSTF Recommendation to screen - Ages 18-79 yo.  Completed   Zoster (Shingles) Vaccine  Completed   HPV Vaccine  Aged Out  *Topic was postponed. The date shown is not the original due date.    Advanced directives: Advance directive discussed with you today. I have provided a copy for you to complete at home and have notarized. Once this is complete please bring a copy in to our office so we can scan it into your chart.   Conditions/risks identified: Aim for 30 minutes of exercise or brisk walking, 6-8 glasses of water, and 5 servings of fruits and vegetables each day.   Next appointment: Follow up in one year for your annual wellness visit.   Preventive Care 75 Years and Older, Male  Preventive care refers to lifestyle choices and visits with your health care provider that can promote health and wellness. What does preventive care include? A yearly physical exam. This is also called an annual well check. Dental exams once or twice a year. Routine eye exams. Ask your health care provider how often you should have your eyes checked. Personal lifestyle choices, including: Daily care of your teeth and gums. Regular physical activity. Eating a healthy diet. Avoiding tobacco and drug use. Limiting  alcohol use. Practicing safe sex. Taking low doses of aspirin every day. Taking vitamin and mineral supplements as recommended by your health care provider. What happens during an annual well check? The services and screenings done by your health care provider during your annual well check will depend on your age, overall health, lifestyle risk factors, and family history of disease. Counseling  Your health care provider may ask you questions about your: Alcohol use. Tobacco use. Drug use. Emotional well-being. Home and relationship well-being. Sexual activity. Eating habits. History of falls. Memory and ability to understand (cognition). Work and work Astronomer. Screening  You may have the following tests or measurements: Height, weight, and BMI. Blood pressure. Lipid and cholesterol levels. These may be checked every 5 years, or more frequently if you are over 75 years old. Skin check. Lung cancer screening. You may have this screening every year starting at age 16 if you have a 30-pack-year history of smoking and currently smoke or have quit within the past 15 years. Fecal occult blood test (FOBT) of the stool. You may have this test every year starting at age 75. Flexible sigmoidoscopy or colonoscopy. You may have a sigmoidoscopy every 5 years or a colonoscopy every 10 years starting at age 75. Prostate cancer screening. Recommendations will vary depending on your family history and other risks. Hepatitis C blood test. Hepatitis B blood test. Sexually transmitted disease (STD) testing. Diabetes screening.  This is done by checking your blood sugar (glucose) after you have not eaten for a while (fasting). You may have this done every 1-3 years. Abdominal aortic aneurysm (AAA) screening. You may need this if you are a current or former smoker. Osteoporosis. You may be screened starting at age 31 if you are at high risk. Talk with your health care provider about your test results,  treatment options, and if necessary, the need for more tests. Vaccines  Your health care provider may recommend certain vaccines, such as: Influenza vaccine. This is recommended every year. Tetanus, diphtheria, and acellular pertussis (Tdap, Td) vaccine. You may need a Td booster every 10 years. Zoster vaccine. You may need this after age 5. Pneumococcal 13-valent conjugate (PCV13) vaccine. One dose is recommended after age 1. Pneumococcal polysaccharide (PPSV23) vaccine. One dose is recommended after age 49. Talk to your health care provider about which screenings and vaccines you need and how often you need them. This information is not intended to replace advice given to you by your health care provider. Make sure you discuss any questions you have with your health care provider. Document Released: 07/04/2015 Document Revised: 02/25/2016 Document Reviewed: 04/08/2015 Elsevier Interactive Patient Education  2017 [place] Prevention in the Home Falls can cause injuries. They can happen to people of all ages. There are many things you can do to make your home safe and to help prevent falls. What can I do on the outside of my home? Regularly fix the edges of walkways and driveways and fix any cracks. Remove anything that might make you trip as you walk through a door, such as a raised step or threshold. Trim any bushes or trees on the path to your home. Use bright outdoor lighting. Clear any walking paths of anything that might make someone trip, such as rocks or tools. Regularly check to see if handrails are loose or broken. Make sure that both sides of any steps have handrails. Any raised decks and porches should have guardrails on the edges. Have any leaves, snow, or ice cleared regularly. Use sand or salt on walking paths during winter. Clean up any spills in your garage right away. This includes oil or grease spills. What can I do in the bathroom? Use night  lights. Install grab bars by the toilet and in the tub and shower. Do not use towel bars as grab bars. Use non-skid mats or decals in the tub or shower. If you need to sit down in the shower, use a plastic, non-slip stool. Keep the floor dry. Clean up any water that spills on the floor as soon as it happens. Remove soap buildup in the tub or shower regularly. Attach bath mats securely with double-sided non-slip rug tape. Do not have throw rugs and other things on the floor that can make you trip. What can I do in the bedroom? Use night lights. Make sure that you have a light by your bed that is easy to reach. Do not use any sheets or blankets that are too big for your bed. They should not hang down onto the floor. Have a firm chair that has side arms. You can use this for support while you get dressed. Do not have throw rugs and other things on the floor that can make you trip. What can I do in the kitchen? Clean up any spills right away. Avoid walking on wet floors. Keep items that you use a lot in easy-to-reach places. If  you need to reach something above you, use a strong step stool that has a grab bar. Keep electrical cords out of the way. Do not use floor polish or wax that makes floors slippery. If you must use wax, use non-skid floor wax. Do not have throw rugs and other things on the floor that can make you trip. What can I do with my stairs? Do not leave any items on the stairs. Make sure that there are handrails on both sides of the stairs and use them. Fix handrails that are broken or loose. Make sure that handrails are as long as the stairways. Check any carpeting to make sure that it is firmly attached to the stairs. Fix any carpet that is loose or worn. Avoid having throw rugs at the top or bottom of the stairs. If you do have throw rugs, attach them to the floor with carpet tape. Make sure that you have a light switch at the top of the stairs and the bottom of the stairs. If  you do not have them, ask someone to add them for you. What else can I do to help prevent falls? Wear shoes that: Do not have high heels. Have rubber bottoms. Are comfortable and fit you well. Are closed at the toe. Do not wear sandals. If you use a stepladder: Make sure that it is fully opened. Do not climb a closed stepladder. Make sure that both sides of the stepladder are locked into place. Ask someone to hold it for you, if possible. Clearly [place] and make sure that you can see: Any grab bars or handrails. First and last steps. Where the edge of each step is. Use tools that help you move around (mobility aids) if they are needed. These include: Canes. Walkers. Scooters. Crutches. Turn on the lights when you go into a dark area. Replace any light bulbs as soon as they burn out. Set up your furniture so you have a clear path. Avoid moving your furniture around. If any of your floors are uneven, fix them. If there are any pets around you, be aware of where they are. Review your medicines with your doctor. Some medicines can make you feel dizzy. This can increase your chance of falling. Ask your doctor what other things that you can do to help prevent falls. This information is not intended to replace advice given to you by your health care provider. Make sure you discuss any questions you have with your health care provider. Document Released: 04/03/2009 Document Revised: 11/13/2015 Document Reviewed: 07/12/2014 Elsevier Interactive Patient Education  2017 [employer].

## 2022-10-18 NOTE — Progress Notes (Signed)
Subjective:   Andrew Phelps is a 77 y.o. male who presents for Medicare Annual/Subsequent preventive examination.  I connected with  Andrew Phelps on 10/18/22 by a audio enabled telemedicine application and verified that I am speaking with the correct person using two identifiers.  Patient Location: Home  Provider Location: Home Office  I discussed the limitations of evaluation and management by telemedicine. The patient expressed understanding and agreed to proceed.  Review of Systems     Cardiac Risk Factors include: advanced age (>63men, >79 women);dyslipidemia;hypertension;male gender;smoking/ tobacco exposure     Objective:    Today's Vitals   10/18/22 1317  Weight: 287 lb (130.2 kg)  Height: 5\' 11"  (1.803 m)   Body mass index is 40.03 kg/m.     10/18/2022    1:24 PM 07/15/2022    3:14 PM 07/15/2022   10:36 AM 10/09/2021    3:37 PM 10/07/2020   10:07 AM 11/01/2019   12:43 PM 10/05/2019   10:43 AM  Advanced Directives  Does Patient Have a Medical Advance Directive? No  No No No No No  Would patient like information on creating a medical advance directive? Yes (MAU/Ambulatory/Procedural Areas - Information given) No - Patient declined  No - Patient declined No - Patient declined No - Patient declined Yes (MAU/Ambulatory/Procedural Areas - Information given)    Current Medications (verified) Outpatient Encounter Medications as of 10/18/2022  Medication Sig   allopurinol (ZYLOPRIM) 300 MG tablet TAKE ONE TABLET BY MOUTH DAILY   calcitRIOL (ROCALTROL) 0.25 MCG capsule Take by mouth.   cholecalciferol (VITAMIN D3) 25 MCG (1000 UNIT) tablet Take 1,000 Units by mouth daily.   gabapentin (NEURONTIN) 600 MG tablet TAKE ONE TABLET BY MOUTH THREE TIMES DAILY   HYDROcodone-acetaminophen (NORCO) 5-325 MG tablet Take 1 tablet by mouth every 12 (twelve) hours as needed for moderate pain.   [START ON 11/08/2022] HYDROcodone-acetaminophen (NORCO) 5-325 MG tablet Take  1 tablet by mouth every 12 (twelve) hours as needed for moderate pain.   [START ON 12/09/2022] HYDROcodone-acetaminophen (NORCO) 5-325 MG tablet Take 1 tablet by mouth every 12 (twelve) hours as needed for moderate pain.   levothyroxine (SYNTHROID) 112 MCG tablet TAKE ONE TABLET BY MOUTH DAILY   loratadine (ALLERGY RELIEF) 10 MG tablet TAKE ONE TABLET BY MOUTH EVERY DAY   mometasone (NASONEX) 50 MCG/ACT nasal spray INSTILL TWO SPRAYS INTO EACH NOSTRIL DAILY   montelukast (SINGULAIR) 10 MG tablet TAKE ONE TABLET BY MOUTH AT BEDTIME   Omega-3 Fatty Acids (OMEGA-3 FISH OIL PO) Take 1 capsule by mouth daily.    simvastatin (ZOCOR) 20 MG tablet TAKE ONE TABLET BY MOUTH DAILY   torsemide (DEMADEX) 100 MG tablet Take 0.5 tablets (50 mg total) by mouth daily. Start on 07/19/22   potassium chloride SA (KLOR-CON M) 20 MEQ tablet Take 20 mEq by mouth 3 (three) times a week.   No facility-administered encounter medications on file as of 10/18/2022.    Allergies (verified) Codeine and Penicillins   History: Past Medical History:  Diagnosis Date   Arthritis    Asthma    Back pain    Family history of adverse reaction to anesthesia    Gout    Hyperlipidemia    Hypertension    Hypothyroidism    Insomnia    Joint pain    Neuropathy    Sleep apnea    not bad enought to do anyting about   Thyroid disease    Past Surgical History:  Procedure  Laterality Date   COLONOSCOPY WITH PROPOFOL N/A 11/05/2019   Procedure: COLONOSCOPY WITH PROPOFOL;  Surgeon: Corbin Ade, MD;  Location: [facility];  Service: Endoscopy;  Laterality: N/A;  12:30pm   none     POLYPECTOMY  11/05/2019   Procedure: POLYPECTOMY;  Surgeon: Corbin Ade, MD;  Location: [facility];  Service: Endoscopy;;   Family History  Problem Relation Age of Onset   Heart disease Mother    Heart disease Father    Hypertension Brother    Hyperlipidemia Brother    Heart disease Sister    Heart disease Brother    Heart disease  Brother    Cancer Brother    Colon cancer Neg Hx    Liver disease Neg Hx    Social History   Socioeconomic History   Marital status: Divorced    Spouse name: Not on file   Number of children: 3   Years of education: 8    Highest education level: 8th grade  Occupational History   Occupation: Retired  Tobacco Use   Smoking status: Former    Packs/day: 2.00    Years: 25.00    Additional pack years: 0.00    Total pack years: 50.00    Types: Cigarettes    Quit date: 07/06/1995    Years since quitting: 27.3   Smokeless tobacco: Current    Types: Chew  Vaping Use   Vaping Use: Never used  Substance and Sexual Activity   Alcohol use: Not Currently    Comment: stopped 3 months ago   Drug use: No   Sexual activity: Yes  Other Topics Concern   Not on file  Social History Narrative   Roommate 50 years old   Spends lots of time with brother and children   Social Determinants of Health   Financial Resource Strain: Low Risk  (10/18/2022)   Overall Financial Resource Strain (CARDIA)    Difficulty of Paying Living Expenses: Not hard at all  Food Insecurity: No Food Insecurity (10/18/2022)   Hunger Vital Sign    Worried About Running Out of Food in the Last Year: Never true    Ran Out of Food in the Last Year: Never true  Transportation Needs: No Transportation Needs (10/18/2022)   PRAPARE - Administrator, Civil Service (Medical): No    Lack of Transportation (Non-Medical): No  Physical Activity: Sufficiently Active (10/18/2022)   Exercise Vital Sign    Days of Exercise per Week: 5 days    Minutes of Exercise per Session: 30 min  Stress: No Stress Concern Present (10/18/2022)   [employer] of Occupational Health - Occupational Stress Questionnaire    Feeling of Stress : Not at all  Social Connections: Socially Isolated (10/18/2022)   Social Connection and Isolation Panel [NHANES]    Frequency of Communication with Friends and Family: More than three times a week     Frequency of Social Gatherings with Friends and Family: More than three times a week    Attends Religious Services: Never    Database administrator or Organizations: No    Attends Engineer, structural: Never    Marital Status: Divorced    Tobacco Counseling Ready to quit: Not Answered Counseling given: Not Answered   Clinical Intake:  Pre-visit preparation completed: Yes  Pain : No/denies pain  Diabetes: No  How often do you need to have someone help you when you read instructions, pamphlets, or other written materials from  your doctor or pharmacy?: 1 - Never  Diabetic?No   Interpreter Needed?: No  Information entered by :: Kandis Fantasia LPN   Activities of Daily Living    10/18/2022    1:24 PM 07/15/2022    3:08 PM  In your present state of health, do you have any difficulty performing the following activities:  Hearing? 0 1  Vision? 0 1  Difficulty concentrating or making decisions? 0 0  Walking or climbing stairs? 0 1  Dressing or bathing? 0 0  Doing errands, shopping? 0 1  Preparing Food and eating ? N   Using the Toilet? N   In the past six months, have you accidently leaked urine? N   Do you have problems with loss of bowel control? N   Managing your Medications? N   Managing your Finances? N   Housekeeping or managing your Housekeeping? N     Patient Care Team: Junie Spencer, FNP as PCP - General (Family Medicine) Jena Gauss Gerrit Friends, MD as Consulting Physician (Gastroenterology) Randa Lynn, MD as Consulting Physician (Nephrology) Delora Fuel, OD (Optometry)  Indicate any recent Medical Services you may have received from other than [facility] providers in the past year (date may be approximate).     Assessment:   This is a routine wellness examination for [place].  Hearing/Vision screen Hearing Screening - Comments:: HOH; wears hearing aids  Vision Screening - Comments:: Wears rx glasses - up to date with routine eye exams with  Dr. Desiree Lucy    Dietary issues and exercise activities discussed: Current Exercise Habits: Home exercise routine, Type of exercise: stretching;walking, Time (Minutes): 30, Frequency (Times/Week): 5, Weekly Exercise (Minutes/Week): 150, Intensity: Mild   Goals Addressed             This Visit's Progress    COMPLETED: Exercise 3x per week (30 min per time)       10/09/2021 AWV Goal: Exercise for General Health  Patient will verbalize understanding of the benefits of increased physical activity: Exercising regularly is important. It will improve your overall fitness, flexibility, and endurance. Regular exercise also will improve your overall health. It can help you control your weight, reduce stress, and improve your bone density. Over the next year, patient will increase physical activity as tolerated with a goal of at least 150 minutes of moderate physical activity per week.  You can tell that you are exercising at a moderate intensity if your heart starts beating faster and you start breathing faster but can still hold a conversation. Moderate-intensity exercise ideas include: Walking 1 mile (1.6 km) in about 15 minutes Biking Hiking Golfing Dancing Water aerobics Patient will verbalize understanding of everyday activities that increase physical activity by providing examples like the following: Yard work, such as: Insurance underwriter Gardening Washing windows or floors Patient will be able to explain general safety guidelines for exercising:  Before you start a new exercise program, talk with your health care provider. Do not exercise so much that you hurt yourself, feel dizzy, or get very short of breath. Wear comfortable clothes and wear shoes with good support. Drink plenty of water while you exercise to prevent dehydration or heat stroke. Work out until your breathing and your heartbeat  get faster.      COMPLETED: Have 3 meals a day       10/05/2019 AWV Goal: Improved Nutrition/Diet  Patient will verbalize understanding that diet  plays an important role in overall health and that a poor diet is a risk factor for many chronic medical conditions.  Over the next year, patient will improve self management of their diet by incorporating better variety, improved meal pattern, decreased fat intake, more consistent meal timing, fewer sweetened foods & beverages, increased physical activity, better food choices, decreased sodium intake, will take vitamin/mineral supplement, choose non-carbonated beverages, adequate fluid intake (at least 6 cups of fluid per day), and watch portion sizes/amount of food eaten at one time. Patient will utilize available community resources to help with food acquisition if needed (ex: food pantries, [address], etc) Patient will work with nutrition specialist if a referral was made      Remain active and independent        Depression Screen    10/18/2022    1:22 PM 10/11/2022    9:39 AM 07/13/2022    8:35 AM 04/09/2022    8:22 AM 01/07/2022   10:40 AM 10/09/2021    3:39 PM 10/08/2021   10:35 AM  PHQ 2/9 Scores  PHQ - 2 Score 0 0 0 0 0 0 0  PHQ- 9 Score 2 2 0   1 1    Fall Risk    10/18/2022    1:19 PM 10/11/2022    9:34 AM 07/27/2022   11:48 AM 07/13/2022    8:34 AM 04/09/2022    8:22 AM  Fall Risk   Falls in the past year? 0 0 0 0 0  Number falls in past yr: 0 0     Injury with Fall? 0 0     Risk for fall due to : No Fall Risks No Fall Risks     Follow up Falls prevention discussed;Education provided;Falls evaluation completed Falls evaluation completed       FALL RISK PREVENTION PERTAINING TO THE HOME:  Any stairs in or around the home? No  If so, are there any without handrails? No  Home free of loose throw rugs in walkways, pet beds, electrical cords, etc? Yes  Adequate lighting in your home to reduce risk of falls? Yes   ASSISTIVE  DEVICES UTILIZED TO PREVENT FALLS:  Life alert? No  Use of a cane, walker or w/c? No  Grab bars in the bathroom? Yes  Shower chair or bench in shower? No  Elevated toilet seat or a handicapped toilet? Yes   TIMED UP AND GO:  Was the test performed? No . Telephonic visit   Cognitive Function:    07/14/2017    8:51 AM  MMSE - Mini Mental State Exam  Orientation to time 5  Orientation to Place 5  Registration 3  Attention/ Calculation 5  Recall 2  Language- name 2 objects 2  Language- repeat 1  Language- follow 3 step command 3  Language- read & follow direction 1  Write a sentence 1  Copy design 1  Total score 29        10/18/2022    1:24 PM 10/09/2021    3:38 PM 10/07/2020   10:07 AM 10/05/2019   10:45 AM  6CIT Screen  What Year? 0 points 0 points 0 points 0 points  What month? 0 points 0 points 0 points 0 points  What time? 0 points 0 points 0 points 0 points  Count back from 20 0 points 2 points 4 points 0 points  Months in reverse 2 points 2 points 0 points 0 points  Repeat phrase 2 points 4  points 8 points 2 points  Total Score 4 points 8 points 12 points 2 points    Immunizations Immunization History  Administered Date(s) Administered   Fluad Quad(high Dose 65+) 07/02/2019, 04/01/2020, 04/07/2021, 04/09/2022   Influenza, High Dose Seasonal PF 06/06/2018   Influenza,inj,Quad PF,6+ Mos 07/05/2013, 05/07/2015, 03/16/2017   Moderna Sars-Covid-2 Vaccination 09/06/2019, 10/04/2019, 06/03/2020   Pneumococcal Conjugate-13 07/05/2013, 12/11/2014   Pneumococcal Polysaccharide-23 11/18/2016   Tdap 10/11/2013   Zoster Recombinat (Shingrix) 06/04/2020, 01/01/2021    TDAP status: Up to date  Flu Vaccine status: Up to date  Pneumococcal vaccine status: Up to date  Covid-19 vaccine status: Information provided on how to obtain vaccines.   Qualifies for Shingles Vaccine? Yes   Zostavax completed No   Shingrix Completed?: Yes  Screening Tests Health Maintenance   Topic Date Due   COVID-19 Vaccine (4 - 2023-24 season) 10/27/2022 (Originally 02/19/2022)   COLONOSCOPY (Pts 45-48yrs Insurance coverage will need to be confirmed)  11/05/2022   INFLUENZA VACCINE  01/20/2023   DTaP/Tdap/Td (2 - Td or Tdap) 10/12/2023   Medicare Annual Wellness (AWV)  10/18/2023   Pneumonia Vaccine 65+ Years old  Completed   Hepatitis C Screening  Completed   Zoster Vaccines- Shingrix  Completed   HPV VACCINES  Aged Out    Health Maintenance  There are no preventive care reminders to display for this patient.   Colorectal cancer screening: Type of screening: Colonoscopy. Completed 11/05/19. Repeat every 3 years  Lung Cancer Screening: (Low Dose CT Chest recommended if Age 37-80 years, 30 pack-year currently smoking OR have quit w/in 15years.) does not qualify.   Lung Cancer Screening Referral: n/a  Additional Screening:  Hepatitis C Screening: does qualify; Completed 07/04/15  Vision Screening: Recommended annual ophthalmology exams for early detection of glaucoma and other disorders of the eye. Is the patient up to date with their annual eye exam?  Yes  Who is the provider or what is the name of the office in which the patient attends annual eye exams? Dr. Desiree Lucy  If pt is not established with a provider, would they like to be referred to a provider to establish care? No .   Dental Screening: Recommended annual dental exams for proper oral hygiene  Community Resource Referral / Chronic Care Management: CRR required this visit?  No   CCM required this visit?  No      Plan:     I have personally reviewed and noted the following in the patient's chart:   Medical and social history Use of alcohol, tobacco or illicit drugs  Current medications and supplements including opioid prescriptions. Patient is currently taking opioid prescriptions. Information provided to patient regarding non-opioid alternatives. Patient advised to discuss non-opioid  treatment plan with their provider. Functional ability and status Nutritional status Physical activity Advanced directives List of other physicians Hospitalizations, surgeries, and ER visits in previous 12 months Vitals Screenings to include cognitive, depression, and falls Referrals and appointments  In addition, I have reviewed and discussed with patient certain preventive protocols, quality metrics, and best practice recommendations. A written personalized care plan for preventive services as well as general preventive health recommendations were provided to patient.     Durwin Nora, [place]   09/27/8117   Due to this being a virtual visit, the after visit summary with patients personalized plan was offered to patient via mail or my-chart. per request, patient was mailed a copy of AVS  Nurse Notes: No concerns

## 2022-10-29 DIAGNOSIS — Z79899 Other long term (current) drug therapy: Secondary | ICD-10-CM | POA: Diagnosis not present

## 2022-10-29 DIAGNOSIS — R809 Proteinuria, unspecified: Secondary | ICD-10-CM | POA: Diagnosis not present

## 2022-10-29 DIAGNOSIS — D631 Anemia in chronic kidney disease: Secondary | ICD-10-CM | POA: Diagnosis not present

## 2022-10-29 DIAGNOSIS — N1832 Chronic kidney disease, stage 3b: Secondary | ICD-10-CM | POA: Diagnosis not present

## 2022-11-05 DIAGNOSIS — M109 Gout, unspecified: Secondary | ICD-10-CM | POA: Diagnosis not present

## 2022-11-05 DIAGNOSIS — R600 Localized edema: Secondary | ICD-10-CM | POA: Diagnosis not present

## 2022-11-05 DIAGNOSIS — I129 Hypertensive chronic kidney disease with stage 1 through stage 4 chronic kidney disease, or unspecified chronic kidney disease: Secondary | ICD-10-CM | POA: Diagnosis not present

## 2022-11-05 DIAGNOSIS — N1832 Chronic kidney disease, stage 3b: Secondary | ICD-10-CM | POA: Diagnosis not present

## 2022-11-19 ENCOUNTER — Telehealth: Payer: Self-pay | Admitting: Family

## 2022-11-19 DIAGNOSIS — G8929 Other chronic pain: Secondary | ICD-10-CM

## 2022-11-19 DIAGNOSIS — F1120 Opioid dependence, uncomplicated: Secondary | ICD-10-CM

## 2022-11-19 DIAGNOSIS — M1990 Unspecified osteoarthritis, unspecified site: Secondary | ICD-10-CM

## 2022-11-19 DIAGNOSIS — Z0289 Encounter for other administrative examinations: Secondary | ICD-10-CM

## 2022-11-19 NOTE — Telephone Encounter (Signed)
[facility] called stating that patient has switched pharmacies and is now getting his meds through them. Says he needs his pain medication and vitamin D transferred to them.

## 2022-11-22 ENCOUNTER — Telehealth: Payer: Self-pay | Admitting: Family

## 2022-11-22 DIAGNOSIS — F1120 Opioid dependence, uncomplicated: Secondary | ICD-10-CM

## 2022-11-22 DIAGNOSIS — M545 Low back pain, unspecified: Secondary | ICD-10-CM

## 2022-11-22 DIAGNOSIS — M1990 Unspecified osteoarthritis, unspecified site: Secondary | ICD-10-CM

## 2022-11-22 DIAGNOSIS — Z0289 Encounter for other administrative examinations: Secondary | ICD-10-CM

## 2022-11-22 MED ORDER — VITAMIN D 25 MCG (1000 UNIT) PO TABS: 1000.0000 [IU] | ORAL_TABLET | Freq: Every day | ORAL | 1 refills | Status: DC

## 2022-11-22 MED ORDER — HYDROCODONE-ACETAMINOPHEN 5-325 MG PO TABS: 1.0000 | ORAL_TABLET | Freq: Two times a day (BID) | ORAL | 0 refills | Status: DC | PRN

## 2022-11-22 NOTE — Telephone Encounter (Signed)
Prescription sent to pharmacy.

## 2022-11-29 ENCOUNTER — Encounter: Payer: Self-pay | Admitting: *Deleted

## 2023-01-05 DIAGNOSIS — I129 Hypertensive chronic kidney disease with stage 1 through stage 4 chronic kidney disease, or unspecified chronic kidney disease: Secondary | ICD-10-CM | POA: Diagnosis not present

## 2023-01-05 DIAGNOSIS — M109 Gout, unspecified: Secondary | ICD-10-CM | POA: Diagnosis not present

## 2023-01-05 DIAGNOSIS — R600 Localized edema: Secondary | ICD-10-CM | POA: Diagnosis not present

## 2023-01-05 DIAGNOSIS — N1832 Chronic kidney disease, stage 3b: Secondary | ICD-10-CM | POA: Diagnosis not present

## 2023-01-05 DIAGNOSIS — N189 Chronic kidney disease, unspecified: Secondary | ICD-10-CM | POA: Diagnosis not present

## 2023-01-07 ENCOUNTER — Encounter: Payer: Self-pay | Admitting: Family

## 2023-01-07 ENCOUNTER — Ambulatory Visit (INDEPENDENT_AMBULATORY_CARE_PROVIDER_SITE_OTHER): Payer: 59 | Admitting: Family

## 2023-01-07 ENCOUNTER — Other Ambulatory Visit: Payer: Self-pay | Admitting: Family

## 2023-01-07 VITALS — BP 108/62 | HR 64 | Temp 97.6°F | Ht 71.0 in | Wt 287.0 lb

## 2023-01-07 DIAGNOSIS — G4700 Insomnia, unspecified: Secondary | ICD-10-CM | POA: Diagnosis not present

## 2023-01-07 DIAGNOSIS — Z0289 Encounter for other administrative examinations: Secondary | ICD-10-CM

## 2023-01-07 DIAGNOSIS — F1120 Opioid dependence, uncomplicated: Secondary | ICD-10-CM | POA: Diagnosis not present

## 2023-01-07 DIAGNOSIS — G6289 Other specified polyneuropathies: Secondary | ICD-10-CM

## 2023-01-07 DIAGNOSIS — E785 Hyperlipidemia, unspecified: Secondary | ICD-10-CM | POA: Diagnosis not present

## 2023-01-07 DIAGNOSIS — N1832 Chronic kidney disease, stage 3b: Secondary | ICD-10-CM

## 2023-01-07 DIAGNOSIS — I10 Essential (primary) hypertension: Secondary | ICD-10-CM | POA: Diagnosis not present

## 2023-01-07 DIAGNOSIS — G8929 Other chronic pain: Secondary | ICD-10-CM

## 2023-01-07 DIAGNOSIS — M545 Low back pain, unspecified: Secondary | ICD-10-CM | POA: Diagnosis not present

## 2023-01-07 DIAGNOSIS — J45909 Unspecified asthma, uncomplicated: Secondary | ICD-10-CM

## 2023-01-07 DIAGNOSIS — M1990 Unspecified osteoarthritis, unspecified site: Secondary | ICD-10-CM | POA: Diagnosis not present

## 2023-01-07 DIAGNOSIS — E039 Hypothyroidism, unspecified: Secondary | ICD-10-CM | POA: Diagnosis not present

## 2023-01-07 LAB — CMP14+EGFR
ALT: 14 IU/L (ref 0–44)
AST: 15 IU/L (ref 0–40)
Albumin: 3.7 g/dL — ABNORMAL LOW (ref 3.8–4.8)
Alkaline Phosphatase: 97 IU/L (ref 44–121)
BUN/Creatinine Ratio: 7 — ABNORMAL LOW (ref 10–24)
BUN: 14 mg/dL (ref 8–27)
Bilirubin Total: 1.3 mg/dL — ABNORMAL HIGH (ref 0.0–1.2)
CO2: 25 mmol/L (ref 20–29)
Calcium: 8.4 mg/dL — ABNORMAL LOW (ref 8.6–10.2)
Chloride: 98 mmol/L (ref 96–106)
Creatinine, Ser: 1.98 mg/dL — ABNORMAL HIGH (ref 0.76–1.27)
Globulin, Total: 3 g/dL (ref 1.5–4.5)
Glucose: 115 mg/dL — ABNORMAL HIGH (ref 70–99)
Potassium: 3.3 mmol/L — ABNORMAL LOW (ref 3.5–5.2)
Sodium: 138 mmol/L (ref 134–144)
Total Protein: 6.7 g/dL (ref 6.0–8.5)
eGFR: 35 mL/min/{1.73_m2} — ABNORMAL LOW (ref >59)

## 2023-01-07 MED ORDER — HYDROCODONE-ACETAMINOPHEN 5-325 MG PO TABS: 1.0000 | ORAL_TABLET | Freq: Two times a day (BID) | ORAL | 0 refills | Status: DC | PRN

## 2023-01-07 NOTE — Patient Instructions (Signed)
Health Maintenance After Age 75 After age 75, you are at a higher risk for certain long-term diseases and infections as well as injuries from falls. Falls are a major cause of broken bones and head injuries in people who are older than age 75. Getting regular preventive care can help to keep you healthy and well. Preventive care includes getting regular testing and making lifestyle changes as recommended by your health care provider. Talk with your health care provider about: Which screenings and tests you should have. A screening is a test that checks for a disease when you have no symptoms. A diet and exercise plan that is right for you. What should I know about screenings and tests to prevent falls? Screening and testing are the best ways to find a health problem early. Early diagnosis and treatment give you the best chance of managing medical conditions that are common after age 75. Certain conditions and lifestyle choices may make you more likely to have a fall. Your health care provider may recommend: Regular vision checks. Poor vision and conditions such as cataracts can make you more likely to have a fall. If you wear glasses, make sure to get your prescription updated if your vision changes. Medicine review. Work with your health care provider to regularly review all of the medicines you are taking, including over-the-counter medicines. Ask your health care provider about any side effects that may make you more likely to have a fall. Tell your health care provider if any medicines that you take make you feel dizzy or sleepy. Strength and balance checks. Your health care provider may recommend certain tests to check your strength and balance while standing, walking, or changing positions. Foot health exam. Foot pain and numbness, as well as not wearing proper footwear, can make you more likely to have a fall. Screenings, including: Osteoporosis screening. Osteoporosis is a condition that causes  the bones to get weaker and break more easily. Blood pressure screening. Blood pressure changes and medicines to control blood pressure can make you feel dizzy. Depression screening. You may be more likely to have a fall if you have a fear of falling, feel depressed, or feel unable to do activities that you used to do. Alcohol use screening. Using too much alcohol can affect your balance and may make you more likely to have a fall. Follow these instructions at home: Lifestyle Do not drink alcohol if: Your health care provider tells you not to drink. If you drink alcohol: Limit how much you have to: 0-1 drink a day for women. 0-2 drinks a day for men. Know how much alcohol is in your drink. In the [place] one drink equals one 12 oz bottle of beer (355 mL), one 5 oz glass of wine (148 mL), or one 1 oz glass of hard liquor (44 mL). Do not use any products that contain nicotine or tobacco. These products include cigarettes, chewing tobacco, and vaping devices, such as e-cigarettes. If you need help quitting, ask your health care provider. Activity  Follow a regular exercise program to stay fit. This will help you maintain your balance. Ask your health care provider what types of exercise are appropriate for you. If you need a cane or walker, use it as recommended by your health care provider. Wear supportive shoes that have nonskid soles. Safety  Remove any tripping hazards, such as rugs, cords, and clutter. Install safety equipment such as grab bars in bathrooms and safety rails on stairs. Keep rooms and walkways   well-lit. General instructions Talk with your health care provider about your risks for falling. Tell your health care provider if: You fall. Be sure to tell your health care provider about all falls, even ones that seem minor. You feel dizzy, tiredness (fatigue), or off-balance. Take over-the-counter and prescription medicines only as told by your health care provider. These include  supplements. Eat a healthy diet and maintain a healthy weight. A healthy diet includes low-fat dairy products, low-fat (lean) meats, and fiber from whole grains, beans, and lots of fruits and vegetables. Stay current with your vaccines. Schedule regular health, dental, and eye exams. Summary Having a healthy lifestyle and getting preventive care can help to protect your health and wellness after age 75. Screening and testing are the best way to find a health problem early and help you avoid having a fall. Early diagnosis and treatment give you the best chance for managing medical conditions that are more common for people who are older than age 75. Falls are a major cause of broken bones and head injuries in people who are older than age 75. Take precautions to prevent a fall at home. Work with your health care provider to learn what changes you can make to improve your health and wellness and to prevent falls. This information is not intended to replace advice given to you by your health care provider. Make sure you discuss any questions you have with your health care provider. Document Revised: 10/27/2020 Document Reviewed: 10/27/2020 Elsevier Patient Education  2024 [employer]  

## 2023-01-07 NOTE — Progress Notes (Signed)
Subjective:    Patient ID: Andrew Phelps, male    DOB: July 09, 1945, 77 y.o.   MRN: [id number]  Chief Complaint  Patient presents with   Medical Management of Chronic Issues   PT presents the office today for chronic follow up and pain medication refill. PT is followed by Nephrologists every 2 months for CKD.  He has peripheral edema. He was seen on 09/26/22  and her demadex 100 mg one day  and 50 mg the other. This was increased to 100 mg daily. He has increased peripheral edema and mild SOB.    He is considered morbid obese with a BMI of 40 and co morbidity of HTN and CKD.  Hypertension This is a chronic problem. The current episode started more than 1 year ago. The problem has been waxing and waning since onset. The problem is uncontrolled. Associated symptoms include peripheral edema and shortness of breath. Pertinent negatives include no malaise/fatigue. Risk factors for coronary artery disease include dyslipidemia, diabetes mellitus, obesity, male gender and sedentary lifestyle. The current treatment provides moderate improvement. Identifiable causes of hypertension include a thyroid problem.  Asthma He complains of shortness of breath. There is no cough or wheezing. This is a chronic problem. The current episode started more than 1 year ago. The problem occurs intermittently. Pertinent negatives include no malaise/fatigue. His symptoms are alleviated by rest. He reports moderate improvement on treatment. His past medical history is significant for asthma.  Thyroid Problem Presents for follow-up visit. Symptoms include constipation. Patient reports no anxiety, diarrhea or fatigue. The symptoms have been stable. His past medical history is significant for hyperlipidemia.  Arthritis Presents for follow-up visit. He complains of pain and stiffness. Affected locations include the right foot, left ankle, left foot, right ankle, left MCP and right MCP. His pain is at a severity of 9/10.  Pertinent negatives include no diarrhea or fatigue.  Insomnia Primary symptoms: difficulty falling asleep, frequent awakening, no malaise/fatigue.   The current episode started more than one year. The onset quality is gradual. The problem occurs intermittently. Past treatments include meditation. The treatment provided moderate relief.  Hyperlipidemia This is a chronic problem. The current episode started more than 1 year ago. The problem is controlled. Recent lipid tests were reviewed and are normal. Exacerbating diseases include obesity. Associated symptoms include shortness of breath. Current antihyperlipidemic treatment includes statins. The current treatment provides moderate improvement of lipids. Risk factors for coronary artery disease include dyslipidemia, hypertension, male sex and a sedentary lifestyle.  Back Pain This is a chronic problem. The current episode started more than 1 year ago. The problem occurs intermittently. The quality of the pain is described as aching. The pain is at a severity of 9/10. The pain is moderate. Risk factors include obesity. The treatment provided moderate relief.    Current opioids rx- Norco 5-325 mg # meds rx- 60 Effectiveness of current meds-stable Adverse reactions from pain meds-none Morphine equivalent- 10   Pill count performed-No Last drug screen - today ( high risk q24m, moderate risk q21m, low risk yearly ) Urine drug screen today- Yes Was the [facility] reviewed- yes             If yes were their any concerning findings? - none   Pain contract signed on: 10/13/22  Review of Systems  Constitutional:  Negative for fatigue and malaise/fatigue.  Respiratory:  Positive for shortness of breath. Negative for cough and wheezing.   Gastrointestinal:  Positive for constipation. Negative for diarrhea.  Musculoskeletal:  Positive for arthritis, back pain and stiffness.  Psychiatric/Behavioral:  The patient has insomnia. The patient is not  nervous/anxious.   All other systems reviewed and are negative.      Objective:   Physical Exam Vitals reviewed.  Constitutional:      General: He is not in acute distress.    Appearance: He is well-developed. He is obese.  HENT:     Head: Normocephalic.     Right Ear: Tympanic membrane normal.     Left Ear: Tympanic membrane normal.  Eyes:     General:        Right eye: No discharge.        Left eye: No discharge.     Pupils: Pupils are equal, round, and reactive to light.  Neck:     Thyroid: No thyromegaly.  Cardiovascular:     Rate and Rhythm: Normal rate and regular rhythm.     Heart sounds: Normal heart sounds. No murmur heard. Pulmonary:     Effort: Pulmonary effort is normal. No respiratory distress.     Breath sounds: Normal breath sounds. No wheezing.  Abdominal:     General: Bowel sounds are normal. There is no distension.     Palpations: Abdomen is soft.     Tenderness: There is no abdominal tenderness.  Musculoskeletal:        General: No tenderness. Normal range of motion.     Cervical back: Normal range of motion and neck supple.     Comments: Low back pain with flexion and extension   Skin:    General: Skin is warm and dry.     Findings: No erythema or rash.  Neurological:     Mental Status: He is alert and oriented to person, place, and time.     Cranial Nerves: No cranial nerve deficit.     Deep Tendon Reflexes: Reflexes are normal and symmetric.  Psychiatric:        Behavior: Behavior normal.        Thought Content: Thought content normal.        Judgment: Judgment normal.      BP 108/62   Pulse 64   Temp 97.6 F (36.4 C) (Temporal)   Ht 5\' 11"  (1.803 m)   Wt 287 lb (130.2 kg)   SpO2 95%   BMI 40.03 kg/m      Assessment & Plan:  Andrew Phelps comes in today with chief complaint of Medical Management of Chronic Issues   Diagnosis and orders addressed:  1. Arthritis - HYDROcodone-acetaminophen (NORCO) 5-325 MG tablet; Take  1 tablet by mouth every 12 (twelve) hours as needed for moderate pain.  Dispense: 60 tablet; Refill: 0 - HYDROcodone-acetaminophen (NORCO) 5-325 MG tablet; Take 1 tablet by mouth every 12 (twelve) hours as needed for moderate pain.  Dispense: 60 tablet; Refill: 0 - HYDROcodone-acetaminophen (NORCO) 5-325 MG tablet; Take 1 tablet by mouth every 12 (twelve) hours as needed for moderate pain.  Dispense: 60 tablet; Refill: 0 - CMP14+EGFR  2. Pain medication agreement signed - HYDROcodone-acetaminophen (NORCO) 5-325 MG tablet; Take 1 tablet by mouth every 12 (twelve) hours as needed for moderate pain.  Dispense: 60 tablet; Refill: 0 - HYDROcodone-acetaminophen (NORCO) 5-325 MG tablet; Take 1 tablet by mouth every 12 (twelve) hours as needed for moderate pain.  Dispense: 60 tablet; Refill: 0 - HYDROcodone-acetaminophen (NORCO) 5-325 MG tablet; Take 1 tablet by mouth every 12 (twelve) hours as needed for moderate pain.  Dispense: 60  tablet; Refill: 0 - CMP14+EGFR  3. Uncomplicated opioid dependence (HCC)  - HYDROcodone-acetaminophen (NORCO) 5-325 MG tablet; Take 1 tablet by mouth every 12 (twelve) hours as needed for moderate pain.  Dispense: 60 tablet; Refill: 0 - HYDROcodone-acetaminophen (NORCO) 5-325 MG tablet; Take 1 tablet by mouth every 12 (twelve) hours as needed for moderate pain.  Dispense: 60 tablet; Refill: 0 - HYDROcodone-acetaminophen (NORCO) 5-325 MG tablet; Take 1 tablet by mouth every 12 (twelve) hours as needed for moderate pain.  Dispense: 60 tablet; Refill: 0 - CMP14+EGFR  4. Chronic bilateral low back pain without sciatica - HYDROcodone-acetaminophen (NORCO) 5-325 MG tablet; Take 1 tablet by mouth every 12 (twelve) hours as needed for moderate pain.  Dispense: 60 tablet; Refill: 0 - HYDROcodone-acetaminophen (NORCO) 5-325 MG tablet; Take 1 tablet by mouth every 12 (twelve) hours as needed for moderate pain.  Dispense: 60 tablet; Refill: 0 - HYDROcodone-acetaminophen (NORCO) 5-325  MG tablet; Take 1 tablet by mouth every 12 (twelve) hours as needed for moderate pain.  Dispense: 60 tablet; Refill: 0 - CMP14+EGFR  5. Essential hypertension - CMP14+EGFR  6. Hypothyroidism, unspecified type - CMP14+EGFR  7. Insomnia, unspecified type - CMP14+EGFR  8. Hyperlipidemia, unspecified hyperlipidemia type - CMP14+EGFR  9. Chronic asthma without complication, unspecified asthma severity, unspecified whether persistent - CMP14+EGFR  10. Morbid obesity (HCC) - CMP14+EGFR  11. Other polyneuropathy - CMP14+EGFR  12. Stage 3b chronic kidney disease (HCC) - CMP14+EGFR   Labs pending Patient reviewed in [place] controlled database, no flags noted. Contract and drug screen are up to date.  Health Maintenance reviewed Diet and exercise encouraged  Follow up plan: 3 months    Jannifer Rodney, FNP

## 2023-01-11 ENCOUNTER — Other Ambulatory Visit: Payer: Self-pay | Admitting: Family

## 2023-01-11 DIAGNOSIS — N1832 Chronic kidney disease, stage 3b: Secondary | ICD-10-CM

## 2023-01-11 DIAGNOSIS — E785 Hyperlipidemia, unspecified: Secondary | ICD-10-CM

## 2023-01-12 DIAGNOSIS — I129 Hypertensive chronic kidney disease with stage 1 through stage 4 chronic kidney disease, or unspecified chronic kidney disease: Secondary | ICD-10-CM | POA: Diagnosis not present

## 2023-01-12 DIAGNOSIS — N1832 Chronic kidney disease, stage 3b: Secondary | ICD-10-CM | POA: Diagnosis not present

## 2023-01-12 DIAGNOSIS — R600 Localized edema: Secondary | ICD-10-CM | POA: Diagnosis not present

## 2023-01-12 DIAGNOSIS — N2581 Secondary hyperparathyroidism of renal origin: Secondary | ICD-10-CM | POA: Diagnosis not present

## 2023-01-13 ENCOUNTER — Ambulatory Visit: Payer: 59 | Admitting: Family

## 2023-01-14 ENCOUNTER — Other Ambulatory Visit: Payer: Self-pay | Admitting: Family

## 2023-01-19 ENCOUNTER — Encounter: Payer: Self-pay | Admitting: Family Medicine

## 2023-02-03 ENCOUNTER — Other Ambulatory Visit: Payer: Self-pay | Admitting: Family

## 2023-02-03 DIAGNOSIS — J45909 Unspecified asthma, uncomplicated: Secondary | ICD-10-CM

## 2023-02-03 DIAGNOSIS — E039 Hypothyroidism, unspecified: Secondary | ICD-10-CM

## 2023-02-03 DIAGNOSIS — J302 Other seasonal allergic rhinitis: Secondary | ICD-10-CM

## 2023-02-26 ENCOUNTER — Inpatient Hospital Stay (HOSPITAL_COMMUNITY)
Admission: EM | Admit: 2023-02-26 | Discharge: 2023-03-01 | DRG: 683 | Disposition: A | Discharge status: Home / Self Care | Payer: 59 | Attending: Internal Medicine | Admitting: Internal Medicine

## 2023-02-26 ENCOUNTER — Other Ambulatory Visit: Payer: Self-pay

## 2023-02-26 ENCOUNTER — Encounter (HOSPITAL_COMMUNITY): Payer: Self-pay | Admitting: Emergency Medicine

## 2023-02-26 DIAGNOSIS — Z880 Allergy status to penicillin: Secondary | ICD-10-CM | POA: Diagnosis not present

## 2023-02-26 DIAGNOSIS — Z1152 Encounter for screening for COVID-19: Secondary | ICD-10-CM

## 2023-02-26 DIAGNOSIS — G4730 Sleep apnea, unspecified: Secondary | ICD-10-CM | POA: Diagnosis present

## 2023-02-26 DIAGNOSIS — N1832 Chronic kidney disease, stage 3b: Secondary | ICD-10-CM | POA: Diagnosis present

## 2023-02-26 DIAGNOSIS — Z83438 Family history of other disorder of lipoprotein metabolism and other lipidemia: Secondary | ICD-10-CM

## 2023-02-26 DIAGNOSIS — R5381 Other malaise: Secondary | ICD-10-CM | POA: Diagnosis present

## 2023-02-26 DIAGNOSIS — F1120 Opioid dependence, uncomplicated: Secondary | ICD-10-CM | POA: Diagnosis present

## 2023-02-26 DIAGNOSIS — E039 Hypothyroidism, unspecified: Secondary | ICD-10-CM | POA: Diagnosis present

## 2023-02-26 DIAGNOSIS — E876 Hypokalemia: Secondary | ICD-10-CM | POA: Diagnosis present

## 2023-02-26 DIAGNOSIS — J45909 Unspecified asthma, uncomplicated: Secondary | ICD-10-CM | POA: Diagnosis present

## 2023-02-26 DIAGNOSIS — Z8249 Family history of ischemic heart disease and other diseases of the circulatory system: Secondary | ICD-10-CM

## 2023-02-26 DIAGNOSIS — Z79899 Other long term (current) drug therapy: Secondary | ICD-10-CM

## 2023-02-26 DIAGNOSIS — E669 Obesity, unspecified: Secondary | ICD-10-CM | POA: Diagnosis present

## 2023-02-26 DIAGNOSIS — G8929 Other chronic pain: Secondary | ICD-10-CM | POA: Diagnosis present

## 2023-02-26 DIAGNOSIS — Z6837 Body mass index (BMI) 37.0-37.9, adult: Secondary | ICD-10-CM

## 2023-02-26 DIAGNOSIS — N179 Acute kidney failure, unspecified: Secondary | ICD-10-CM | POA: Diagnosis present

## 2023-02-26 DIAGNOSIS — E860 Dehydration: Secondary | ICD-10-CM | POA: Diagnosis present

## 2023-02-26 DIAGNOSIS — M109 Gout, unspecified: Secondary | ICD-10-CM | POA: Diagnosis present

## 2023-02-26 DIAGNOSIS — Y92009 Unspecified place in unspecified non-institutional (private) residence as the place of occurrence of the external cause: Secondary | ICD-10-CM | POA: Diagnosis not present

## 2023-02-26 DIAGNOSIS — I130 Hypertensive heart and chronic kidney disease with heart failure and stage 1 through stage 4 chronic kidney disease, or unspecified chronic kidney disease: Secondary | ICD-10-CM | POA: Diagnosis present

## 2023-02-26 DIAGNOSIS — E785 Hyperlipidemia, unspecified: Secondary | ICD-10-CM | POA: Diagnosis present

## 2023-02-26 DIAGNOSIS — I10 Essential (primary) hypertension: Secondary | ICD-10-CM | POA: Diagnosis not present

## 2023-02-26 DIAGNOSIS — E871 Hypo-osmolality and hyponatremia: Secondary | ICD-10-CM | POA: Diagnosis present

## 2023-02-26 DIAGNOSIS — Z23 Encounter for immunization: Secondary | ICD-10-CM

## 2023-02-26 DIAGNOSIS — F1021 Alcohol dependence, in remission: Secondary | ICD-10-CM | POA: Diagnosis present

## 2023-02-26 DIAGNOSIS — I5032 Chronic diastolic (congestive) heart failure: Secondary | ICD-10-CM | POA: Diagnosis present

## 2023-02-26 DIAGNOSIS — Z79890 Hormone replacement therapy (postmenopausal): Secondary | ICD-10-CM

## 2023-02-26 DIAGNOSIS — T502X5A Adverse effect of carbonic-anhydrase inhibitors, benzothiadiazides and other diuretics, initial encounter: Secondary | ICD-10-CM | POA: Diagnosis present

## 2023-02-26 DIAGNOSIS — G629 Polyneuropathy, unspecified: Secondary | ICD-10-CM | POA: Diagnosis present

## 2023-02-26 DIAGNOSIS — Z91030 Bee allergy status: Secondary | ICD-10-CM

## 2023-02-26 DIAGNOSIS — E66812 Obesity, class 2: Secondary | ICD-10-CM | POA: Diagnosis present

## 2023-02-26 DIAGNOSIS — F17220 Nicotine dependence, chewing tobacco, uncomplicated: Secondary | ICD-10-CM | POA: Diagnosis present

## 2023-02-26 DIAGNOSIS — M545 Low back pain, unspecified: Secondary | ICD-10-CM | POA: Diagnosis present

## 2023-02-26 DIAGNOSIS — D72829 Elevated white blood cell count, unspecified: Secondary | ICD-10-CM | POA: Diagnosis present

## 2023-02-26 LAB — BASIC METABOLIC PANEL
Anion gap: 15 (ref 5–15)
BUN: 81 mg/dL — ABNORMAL HIGH (ref 8–23)
CO2: 29 mmol/L (ref 22–32)
Calcium: 8.4 mg/dL — ABNORMAL LOW (ref 8.9–10.3)
Chloride: 78 mmol/L — ABNORMAL LOW (ref 98–111)
Creatinine, Ser: 2.95 mg/dL — ABNORMAL HIGH (ref 0.61–1.24)
GFR, Estimated: 21 mL/min — ABNORMAL LOW (ref >60)
Glucose, Bld: 92 mg/dL (ref 70–99)
Potassium: 2.1 mmol/L — CL (ref 3.5–5.1)
Sodium: 122 mmol/L — ABNORMAL LOW (ref 135–145)

## 2023-02-26 LAB — URINALYSIS, ROUTINE W REFLEX MICROSCOPIC
Bilirubin Urine: NEGATIVE
Glucose, UA: NEGATIVE mg/dL
Hgb urine dipstick: NEGATIVE
Ketones, ur: NEGATIVE mg/dL
Leukocytes,Ua: NEGATIVE
Nitrite: NEGATIVE
Protein, ur: NEGATIVE mg/dL
Specific Gravity, Urine: 1.005 (ref 1.005–1.030)
pH: 7 (ref 5.0–8.0)

## 2023-02-26 LAB — CBC WITH DIFFERENTIAL/PLATELET
Abs Immature Granulocytes: 0.1 10*3/uL — ABNORMAL HIGH (ref 0.00–0.07)
Basophils Absolute: 0.1 10*3/uL (ref 0.0–0.1)
Basophils Relative: 0 %
Eosinophils Absolute: 0.2 10*3/uL (ref 0.0–0.5)
Eosinophils Relative: 1 %
HCT: 37.1 % — ABNORMAL LOW (ref 39.0–52.0)
Hemoglobin: 13 g/dL (ref 13.0–17.0)
Immature Granulocytes: 1 %
Lymphocytes Relative: 13 %
Lymphs Abs: 1.8 10*3/uL (ref 0.7–4.0)
MCH: 31 pg (ref 26.0–34.0)
MCHC: 35 g/dL (ref 30.0–36.0)
MCV: 88.3 fL (ref 80.0–100.0)
Monocytes Absolute: 0.8 10*3/uL (ref 0.1–1.0)
Monocytes Relative: 6 %
Neutro Abs: 10.8 10*3/uL — ABNORMAL HIGH (ref 1.7–7.7)
Neutrophils Relative %: 79 %
Platelets: 211 10*3/uL (ref 150–400)
RBC: 4.2 MIL/uL — ABNORMAL LOW (ref 4.22–5.81)
RDW: 14.7 % (ref 11.5–15.5)
WBC: 13.8 10*3/uL — ABNORMAL HIGH (ref 4.0–10.5)
nRBC: 0 % (ref 0.0–0.2)

## 2023-02-26 LAB — COMPREHENSIVE METABOLIC PANEL
ALT: 64 U/L — ABNORMAL HIGH (ref 0–44)
AST: 49 U/L — ABNORMAL HIGH (ref 15–41)
Albumin: 3.2 g/dL — ABNORMAL LOW (ref 3.5–5.0)
Alkaline Phosphatase: 71 U/L (ref 38–126)
Anion gap: 15 (ref 5–15)
BUN: 81 mg/dL — ABNORMAL HIGH (ref 8–23)
CO2: 30 mmol/L (ref 22–32)
Calcium: 8.6 mg/dL — ABNORMAL LOW (ref 8.9–10.3)
Chloride: 77 mmol/L — ABNORMAL LOW (ref 98–111)
Creatinine, Ser: 3.08 mg/dL — ABNORMAL HIGH (ref 0.61–1.24)
GFR, Estimated: 20 mL/min — ABNORMAL LOW (ref >60)
Glucose, Bld: 139 mg/dL — ABNORMAL HIGH (ref 70–99)
Potassium: 2.1 mmol/L — CL (ref 3.5–5.1)
Sodium: 122 mmol/L — ABNORMAL LOW (ref 135–145)
Total Bilirubin: 1.1 mg/dL (ref 0.3–1.2)
Total Protein: 7.6 g/dL (ref 6.5–8.1)

## 2023-02-26 LAB — SARS CORONAVIRUS 2 BY RT PCR: SARS Coronavirus 2 by RT PCR: NEGATIVE

## 2023-02-26 LAB — TROPONIN I (HIGH SENSITIVITY): Troponin I (High Sensitivity): 28 ng/L — ABNORMAL HIGH (ref <18)

## 2023-02-26 MED ORDER — POTASSIUM CHLORIDE 10 MEQ/100ML IV SOLN
10.0000 meq | Freq: Once | INTRAVENOUS | Status: DC
  Filled 2023-02-26: qty 100

## 2023-02-26 MED ORDER — HYDROCODONE-ACETAMINOPHEN 5-325 MG PO TABS: 1.0000 | ORAL_TABLET | Freq: Two times a day (BID) | ORAL | Status: DC | PRN

## 2023-02-26 MED ORDER — MAGNESIUM SULFATE 2 GM/50ML IV SOLN
2.0000 g | INTRAVENOUS | Status: AC
  Administered 2023-02-26: 2 g via INTRAVENOUS
  Filled 2023-02-26: qty 50

## 2023-02-26 MED ORDER — MONTELUKAST SODIUM 10 MG PO TABS
10.0000 mg | ORAL_TABLET | Freq: Every day | ORAL | Status: DC
  Administered 2023-02-26 – 2023-02-28 (×3): 10 mg via ORAL
  Filled 2023-02-26 (×3): qty 1

## 2023-02-26 MED ORDER — POTASSIUM CHLORIDE 10 MEQ/100ML IV SOLN
10.0000 meq | INTRAVENOUS | Status: AC
  Administered 2023-02-26 – 2023-02-27 (×6): 10 meq via INTRAVENOUS
  Filled 2023-02-26 (×5): qty 100

## 2023-02-26 MED ORDER — ONDANSETRON HCL 4 MG/2ML IJ SOLN: 4.0000 mg | Freq: Four times a day (QID) | INTRAMUSCULAR | Status: DC | PRN

## 2023-02-26 MED ORDER — ENOXAPARIN SODIUM 30 MG/0.3ML IJ SOSY
30.0000 mg | PREFILLED_SYRINGE | INTRAMUSCULAR | Status: DC
  Administered 2023-02-27 – 2023-02-28 (×2): 30 mg via SUBCUTANEOUS
  Filled 2023-02-26 (×2): qty 0.3

## 2023-02-26 MED ORDER — SODIUM CHLORIDE 0.9 % IV BOLUS: 1000.0000 mL | Freq: Once | INTRAVENOUS | Status: DC

## 2023-02-26 MED ORDER — GABAPENTIN 300 MG PO CAPS
600.0000 mg | ORAL_CAPSULE | Freq: Three times a day (TID) | ORAL | Status: DC
  Administered 2023-02-26 – 2023-03-01 (×8): 600 mg via ORAL
  Filled 2023-02-26 (×8): qty 2

## 2023-02-26 MED ORDER — ALLOPURINOL 300 MG PO TABS
300.0000 mg | ORAL_TABLET | Freq: Every day | ORAL | Status: DC
  Administered 2023-02-27 – 2023-03-01 (×3): 300 mg via ORAL
  Filled 2023-02-26 (×3): qty 1

## 2023-02-26 MED ORDER — POTASSIUM CHLORIDE IN NACL 40-0.9 MEQ/L-% IV SOLN
INTRAVENOUS | Status: DC
  Filled 2023-02-26: qty 1000

## 2023-02-26 MED ORDER — LEVOTHYROXINE SODIUM 112 MCG PO TABS
112.0000 ug | ORAL_TABLET | Freq: Every day | ORAL | Status: DC
  Administered 2023-02-27 – 2023-03-01 (×3): 112 ug via ORAL
  Filled 2023-02-26 (×3): qty 1

## 2023-02-26 MED ORDER — FLUTICASONE PROPIONATE 50 MCG/ACT NA SUSP
2.0000 | Freq: Every day | NASAL | Status: DC
  Administered 2023-02-27 – 2023-03-01 (×3): 2 via NASAL
  Filled 2023-02-26: qty 16

## 2023-02-26 MED ORDER — SIMVASTATIN 20 MG PO TABS
20.0000 mg | ORAL_TABLET | Freq: Every day | ORAL | Status: DC
  Administered 2023-02-27 – 2023-03-01 (×3): 20 mg via ORAL
  Filled 2023-02-26 (×3): qty 1

## 2023-02-26 MED ORDER — INFLUENZA VAC A&B SURF ANT ADJ 0.5 ML IM SUSY
0.5000 mL | PREFILLED_SYRINGE | INTRAMUSCULAR | Status: AC
  Administered 2023-02-27: 0.5 mL via INTRAMUSCULAR
  Filled 2023-02-26: qty 0.5

## 2023-02-26 MED ORDER — ONDANSETRON HCL 4 MG PO TABS: 4.0000 mg | ORAL_TABLET | Freq: Four times a day (QID) | ORAL | Status: DC | PRN

## 2023-02-26 MED ORDER — POTASSIUM CHLORIDE CRYS ER 20 MEQ PO TBCR
40.0000 meq | EXTENDED_RELEASE_TABLET | Freq: Three times a day (TID) | ORAL | Status: AC
  Administered 2023-02-26 – 2023-02-27 (×4): 40 meq via ORAL
  Filled 2023-02-26 (×3): qty 2

## 2023-02-26 MED ORDER — SODIUM CHLORIDE 0.9 % IV SOLN: INTRAVENOUS | Status: DC

## 2023-02-26 MED ORDER — CALCITRIOL 0.25 MCG PO CAPS: 0.2500 ug | ORAL_CAPSULE | Freq: Every day | ORAL | Status: DC

## 2023-02-26 MED ORDER — POTASSIUM CHLORIDE 10 MEQ/100ML IV SOLN: 10.0000 meq | INTRAVENOUS | Status: DC

## 2023-02-26 MED ORDER — POTASSIUM CHLORIDE CRYS ER 20 MEQ PO TBCR
40.0000 meq | EXTENDED_RELEASE_TABLET | Freq: Once | ORAL | Status: DC
  Filled 2023-02-26: qty 2

## 2023-02-26 NOTE — ED Triage Notes (Signed)
Pt states he thinks he's dehydrated. States he has lost 30lbs in 3 weeks. States he is very weak and has "lost all his fluid" from taking "2 fluid pills".

## 2023-02-26 NOTE — ED Provider Notes (Cosign Needed Addendum)
[place] EMERGENCY DEPARTMENT AT [facility] Provider Note   CSN: [id number] Arrival date & time: 02/26/23  1841     History  Chief Complaint  Patient presents with   Weakness    Andrew Phelps is a 77 y.o. male with a history including hypertension, gout, hypothyroidism, neuropathy in his lower extremities, hyperlipidemia and sleep apnea, also history of stage III kidney disease with prior history of acute renal failure presenting for evaluation of generalized weakness, generalized malaise, sitting when he gets up to walk he feels like his legs are going to buckle on him.  He states after he sits down he feels lightheaded, he denies syncope.  He also denies chest pain, increased shortness of breath, cough or palpitations.  He is currently on torsemide, states his urine has been less then normal over the past several days.  The history is provided by the patient.       Home Medications Prior to Admission medications   Medication Sig Start Date End Date Taking? Authorizing Provider  allopurinol (ZYLOPRIM) 300 MG tablet TAKE 1 TABLET BY MOUTH DAILY 01/11/23   Jannifer Rodney A, FNP  calcitRIOL (ROCALTROL) 0.25 MCG capsule Take by mouth. 06/03/22 06/03/23  [provider]  cholecalciferol (VITAMIN D3) 25 MCG (1000 UNIT) tablet Take 1 tablet (1,000 Units total) by mouth daily. 11/22/22   Junie Spencer, FNP  gabapentin (NEURONTIN) 600 MG tablet TAKE 1 TABLET BY MOUTH THREE TIMES DAILY 01/07/23   Jannifer Rodney A, FNP  HYDROcodone-acetaminophen (NORCO) 5-325 MG tablet Take 1 tablet by mouth every 12 (twelve) hours as needed for moderate pain. 01/07/23   Junie Spencer, FNP  HYDROcodone-acetaminophen (NORCO) 5-325 MG tablet Take 1 tablet by mouth every 12 (twelve) hours as needed for moderate pain. 02/03/23   Junie Spencer, FNP  HYDROcodone-acetaminophen (NORCO) 5-325 MG tablet Take 1 tablet by mouth every 12 (twelve) hours as needed for moderate pain. 03/03/23    Junie Spencer, FNP  levothyroxine (SYNTHROID) 112 MCG tablet TAKE 1 TABLET BY MOUTH DAILY 02/03/23   Jannifer Rodney A, FNP  loratadine (CLARITIN) 10 MG tablet TAKE 1 TABLET BY MOUTH EVERY DAY 02/03/23   Jannifer Rodney A, FNP  mometasone (NASONEX) 50 MCG/ACT nasal spray instill 2 SPRAYS into each nostril DAILY 02/03/23   Jannifer Rodney A, FNP  montelukast (SINGULAIR) 10 MG tablet TAKE 1 TABLET BY MOUTH AT BEDTIME 02/03/23   Hawks, [place] A, FNP  Omega-3 Fatty Acids (OMEGA-3 FISH OIL PO) Take 1 capsule by mouth daily.     [provider]  potassium chloride SA (KLOR-CON M) 20 MEQ tablet TAKE 1 TABLET BY MOUTH three times WEEKLY ON monday, wednesday, AND friday, DO not crush OR chew 01/17/23   Jannifer Rodney A, FNP  simvastatin (ZOCOR) 20 MG tablet TAKE 1 TABLET BY MOUTH DAILY 01/11/23   Jannifer Rodney A, FNP  torsemide (DEMADEX) 100 MG tablet Take 0.5 tablets (50 mg total) by mouth daily. Start on 07/19/22 07/19/22   Catarina Hartshorn, MD      Allergies    Codeine and Penicillins    Review of Systems   Review of Systems  Constitutional:  Positive for fatigue. Negative for fever.  HENT:  Negative for congestion.   Eyes: Negative.   Respiratory:  Negative for chest tightness and shortness of breath.   Cardiovascular:  Negative for chest pain.  Gastrointestinal:  Negative for abdominal pain, nausea and vomiting.  Genitourinary: Negative.   Musculoskeletal:  Negative for  arthralgias, joint swelling and neck pain.  Skin: Negative.  Negative for rash and wound.  Neurological:  Positive for weakness and light-headedness. Negative for dizziness and numbness.  Psychiatric/Behavioral: Negative.    All other systems reviewed and are negative.   Physical Exam Updated Vital Signs BP 120/65 (BP Location: Right Arm)   Pulse 87   Temp 99.3 F (37.4 C) (Oral)   Resp 16   Ht 5\' 11"  (1.803 m)   Wt 121.6 kg   SpO2 95%   BMI 37.38 kg/m  Physical Exam Vitals and nursing note reviewed.   Constitutional:      Appearance: He is well-developed.  HENT:     Head: Normocephalic and atraumatic.  Eyes:     Conjunctiva/sclera: Conjunctivae normal.  Cardiovascular:     Rate and Rhythm: Normal rate and regular rhythm.     Heart sounds: Normal heart sounds.  Pulmonary:     Effort: Pulmonary effort is normal.     Breath sounds: Normal breath sounds. No wheezing.  Abdominal:     General: Bowel sounds are normal.     Palpations: Abdomen is soft.     Tenderness: There is no abdominal tenderness.  Musculoskeletal:        General: Normal range of motion.     Cervical back: Normal range of motion.  Skin:    General: Skin is warm and dry.  Neurological:     General: No focal deficit present.     Mental Status: He is alert and oriented to person, place, and time.     Cranial Nerves: Cranial nerves 2-12 are intact.     Motor: Motor function is intact.     Comments: Equal grip strength     ED Results / Procedures / Treatments   Labs (all labs ordered are listed, but only abnormal results are displayed) Labs Reviewed  CBC WITH DIFFERENTIAL/PLATELET - Abnormal; Notable for the following components:      Result Value   WBC 13.8 (*)    RBC 4.20 (*)    HCT 37.1 (*)    Neutro Abs 10.8 (*)    Abs Immature Granulocytes 0.10 (*)    All other components within normal limits  COMPREHENSIVE METABOLIC PANEL - Abnormal; Notable for the following components:   Sodium 122 (*)    Potassium 2.1 (*)    Chloride 77 (*)    Glucose, Bld 139 (*)    BUN 81 (*)    Creatinine, Ser 3.08 (*)    Calcium 8.6 (*)    Albumin 3.2 (*)    AST 49 (*)    ALT 64 (*)    GFR, Estimated 20 (*)    All other components within normal limits  TROPONIN I (HIGH SENSITIVITY) - Abnormal; Notable for the following components:   Troponin I (High Sensitivity) 28 (*)    All other components within normal limits  SARS CORONAVIRUS 2 BY RT PCR  URINALYSIS, ROUTINE W REFLEX MICROSCOPIC    EKG EKG  Interpretation Date/Time:  Saturday February 26 2023 20:05:56 EDT Ventricular Rate:  78 PR Interval:  188 QRS Duration:  100 QT Interval:  451 QTC Calculation: 514 R Axis:   -3  Text Interpretation: Sinus or ectopic atrial rhythm Low voltage, precordial leads Prolonged QT interval since last tracing no significant change Confirmed by Eber Hong ([id number]) on 02/26/2023 8:09:04 PM  Radiology No results found.  Procedures Procedures    Medications Ordered in ED Medications  sodium chloride 0.9 % bolus  1,000 mL (1,000 mLs Intravenous Not Given 02/26/23 2107)  potassium chloride SA (KLOR-CON M) CR tablet 40 mEq (has no administration in time range)  magnesium sulfate IVPB 2 g 50 mL (2 g Intravenous New Bag/Given 02/26/23 2114)  potassium chloride 10 mEq in 100 mL IVPB (has no administration in time range)  0.9 %  sodium chloride infusion (has no administration in time range)  potassium chloride 10 mEq in 100 mL IVPB (has no administration in time range)  potassium chloride SA (KLOR-CON M) CR tablet 40 mEq (has no administration in time range)    ED Course/ Medical Decision Making/ A&P                                 Medical Decision Making Patient presenting with a 2-week-week history of slowly progressive generalized weakness, severe over the past 3 days stating he feels like he is going to fall and that his legs will not support him with weightbearing over the past 3 days.  He had similar symptoms when he was admitted back in January which time he was having worsening of his renal function.  He denies fevers or chills, denies chest pain, baseline shortness of breath in a patient with COPD.  He has nonfocal on neuroexam, I doubt this represents CVA, could represent ACS, again denies chest pain.  Additional differential including electrolyte derangement, dehydration.  Pertinent labs per below, potassium 5 runs IV along with 40 p.o. ordered, normal saline 1 L ordered.  Amount and/or  Complexity of Data Reviewed Labs: ordered.    Details: Labs are significant for profound potassium at 2.1, a sodium of 122, he is also clinically dehydrated with a BUN of 81 and a creatinine of 3.08, GFR 20.  He also has a mild elevation in his LFTs with an AST of 49 and an ALT of 64, WBC count is 13.8, hemoglobin is normal at 13.0.  Pending COVID testing.  Troponin is slightly elevated at 28, delta troponin has been ordered. ECG/medicine tests: ordered.    Details: Rate 78, low voltage, prolonged QT, no significant changes. Discussion of management or test interpretation with external provider(s): Call placed to hospitalist service, spoke with Dr. Gust Rung who accepts patient for admission.  Risk Prescription drug management. Decision regarding hospitalization.   CRITICAL CARE Performed by: Burgess Amor Total critical care time: 40 minutes Critical care time was exclusive of separately billable procedures and treating other patients. Critical care was necessary to treat or prevent imminent or life-threatening deterioration. Critical care was time spent personally by me on the following activities: development of treatment plan with patient and/or surrogate as well as nursing, discussions with consultants, evaluation of patient's response to treatment, examination of patient, obtaining history from patient or surrogate, ordering and performing treatments and interventions, ordering and review of laboratory studies, ordering and review of radiographic studies, pulse oximetry and re-evaluation of patient's condition.         Final Clinical Impression(s) / ED Diagnoses Final diagnoses:  Hypokalemia  Hyponatremia  Acute kidney injury [facility])    Rx / DC Orders ED Discharge Orders     None         Victoriano Lain 02/26/23 2112    Burgess Amor, PA-C 02/26/23 2117    Eber Hong, MD 02/27/23 1231

## 2023-02-26 NOTE — ED Notes (Signed)
Date and time results received: 02/26/23 2050   Test: Potassium Critical Value: 2.1  Name of Provider Notified: Hyacinth Meeker  Orders Received? Or Actions Taken?: NA

## 2023-02-26 NOTE — H&P (Signed)
History and Physical    Patient: Andrew Phelps ONG:[id number] DOB: 05/14/1946 DOA: 02/26/2023 DOS: the patient was seen and examined on 02/26/2023 PCP: Junie Spencer, FNP  Patient coming from: Home  Chief Complaint:  Chief Complaint  Patient presents with   Weakness   HPI: Andrew Phelps is a 77 y.o. male with medical history significant of heart failure preserved EF, stage III chronic kidney disease, hypothyroidism, hypertension, gout.  Patient seen for 3 weeks of increasing fatigue and weakness.  Over the past day, he has noted that his leg has been buckling whenever he tries to walk.  He is unable to walk very far due to weakness and fatigue.  His symptoms have been worsening.  He has been on Demadex.  His nephrologist has been managing this.  Sometime over the last month or 2, the nephrologist increased his Demadex from alternating 50 mg and 100 mg to taking 100 mg daily.  He was also on another diuretic will which he thinks was metolazone, but he stopped that recently.  Denies fevers, chills, nausea, vomiting.  No abdominal pain.  No arrhythmias or palpitations that he has noticed.  ED course: Labs obtained.  CMP shows potassium of 2.1 and a sodium of 122.  His creatinine is elevated at 3.08, which is up from the baseline of 1.98.  His LFTs are slightly elevated.  In addition he has a slight leukocytosis  Review of Systems: As mentioned in the history of present illness. All other systems reviewed and are negative. Past Medical History:  Diagnosis Date   Arthritis    Asthma    Back pain    Family history of adverse reaction to anesthesia    Gout    Hyperlipidemia    Hypertension    Hypothyroidism    Insomnia    Joint pain    Neuropathy    Sleep apnea    not bad enought to do anyting about   Thyroid disease    Past Surgical History:  Procedure Laterality Date   COLONOSCOPY WITH PROPOFOL N/A 11/05/2019   Procedure: COLONOSCOPY WITH PROPOFOL;  Surgeon:  Corbin Ade, MD;  [facility];  Service: Endoscopy;  Laterality: N/A;  12:30pm   none     POLYPECTOMY  11/05/2019   Procedure: POLYPECTOMY;  Surgeon: Corbin Ade, MD;  Location: [facility];  Service: Endoscopy;;   Social History:  reports that he quit smoking about 27 years ago. His smoking use included cigarettes. He started smoking about 52 years ago. He has a 50 pack-year smoking history. His smokeless tobacco use includes chew. He reports that he does not currently use alcohol. He reports that he does not use drugs.  Allergies  Allergen Reactions   Codeine     Abdominal pain   Penicillins Rash    Childhood reaction    Family History  Problem Relation Age of Onset   Heart disease Mother    Heart disease Father    Hypertension Brother    Hyperlipidemia Brother    Heart disease Sister    Heart disease Brother    Heart disease Brother    Cancer Brother    Colon cancer Neg Hx    Liver disease Neg Hx     Prior to Admission medications   Medication Sig Start Date End Date Taking? Authorizing Provider  allopurinol (ZYLOPRIM) 300 MG tablet TAKE 1 TABLET BY MOUTH DAILY 01/11/23   Jannifer Rodney A, FNP  calcitRIOL (ROCALTROL) 0.25  MCG capsule Take by mouth. 06/03/22 06/03/23  [provider]  cholecalciferol (VITAMIN D3) 25 MCG (1000 UNIT) tablet Take 1 tablet (1,000 Units total) by mouth daily. 11/22/22   Junie Spencer, FNP  gabapentin (NEURONTIN) 600 MG tablet TAKE 1 TABLET BY MOUTH THREE TIMES DAILY 01/07/23   Jannifer Rodney A, FNP  HYDROcodone-acetaminophen (NORCO) 5-325 MG tablet Take 1 tablet by mouth every 12 (twelve) hours as needed for moderate pain. 01/07/23   Junie Spencer, FNP  HYDROcodone-acetaminophen (NORCO) 5-325 MG tablet Take 1 tablet by mouth every 12 (twelve) hours as needed for moderate pain. 02/03/23   Junie Spencer, FNP  HYDROcodone-acetaminophen (NORCO) 5-325 MG tablet Take 1 tablet by mouth every 12 (twelve) hours as needed for  moderate pain. 03/03/23   Junie Spencer, FNP  levothyroxine (SYNTHROID) 112 MCG tablet TAKE 1 TABLET BY MOUTH DAILY 02/03/23   Jannifer Rodney A, FNP  loratadine (CLARITIN) 10 MG tablet TAKE 1 TABLET BY MOUTH EVERY DAY 02/03/23   Jannifer Rodney A, FNP  mometasone (NASONEX) 50 MCG/ACT nasal spray instill 2 SPRAYS into each nostril DAILY 02/03/23   Jannifer Rodney A, FNP  montelukast (SINGULAIR) 10 MG tablet TAKE 1 TABLET BY MOUTH AT BEDTIME 02/03/23   Hawks, [place] A, FNP  Omega-3 Fatty Acids (OMEGA-3 FISH OIL PO) Take 1 capsule by mouth daily.     [provider]  potassium chloride SA (KLOR-CON M) 20 MEQ tablet TAKE 1 TABLET BY MOUTH three times WEEKLY ON monday, wednesday, AND friday, DO not crush OR chew 01/17/23   Jannifer Rodney A, FNP  simvastatin (ZOCOR) 20 MG tablet TAKE 1 TABLET BY MOUTH DAILY 01/11/23   Jannifer Rodney A, FNP  torsemide (DEMADEX) 100 MG tablet Take 0.5 tablets (50 mg total) by mouth daily. Start on 07/19/22 07/19/22   Catarina Hartshorn, MD    Physical Exam: Vitals:   02/26/23 1924 02/26/23 1925 02/26/23 1926  BP:  120/65   Pulse:  87   Resp:  16   Temp:  99.3 F (37.4 C)   TempSrc:  Oral   SpO2:  95%   Weight: 116.6 kg  121.6 kg  Height: 5\' 11"  (1.803 m)     General: Older male. Awake and alert and oriented x3. No acute cardiopulmonary distress.  HEENT: Normocephalic atraumatic.  Right and left ears normal in appearance.  Pupils equal, round, reactive to light. Extraocular muscles are intact. Sclerae anicteric and noninjected.  Moist mucosal membranes. No mucosal lesions.  Neck: Neck supple without lymphadenopathy. No carotid bruits. No masses palpated.  Cardiovascular: Regular rate with normal S1-S2 sounds. No murmurs, rubs, gallops auscultated. No JVD.  Respiratory: Good respiratory effort with no wheezes, rales, rhonchi. Lungs clear to auscultation bilaterally.  No accessory muscle use. Abdomen: Soft, nontender, nondistended. Active bowel sounds. No masses or  hepatosplenomegaly  Skin: No rashes, lesions, or ulcerations.  Dry, warm to touch. 2+ dorsalis pedis and radial pulses. Musculoskeletal: No calf or leg pain. All major joints not erythematous nontender.  No upper or lower joint deformation.  Good ROM.  No contractures  Psychiatric: Intact judgment and insight. Pleasant and cooperative. Neurologic: No focal neurological deficits. Strength is 5/5 and symmetric in upper and lower extremities.  Cranial nerves II through XII are grossly intact.  Data Reviewed: Results for orders placed or performed during the hospital encounter of 02/26/23 (from the past 24 hour(s))  CBC with Differential     Status: Abnormal   Collection Time: 02/26/23  8:00  PM  Result Value Ref Range   WBC 13.8 (H) 4.0 - 10.5 K/uL   RBC 4.20 (L) 4.22 - 5.81 MIL/uL   Hemoglobin 13.0 13.0 - 17.0 g/dL   HCT 14.7 (L) 82.9 - 56.2 %   MCV 88.3 80.0 - 100.0 fL   MCH 31.0 26.0 - 34.0 pg   MCHC 35.0 30.0 - 36.0 g/dL   RDW 13.0 86.5 - 78.4 %   Platelets 211 150 - 400 K/uL   nRBC 0.0 0.0 - 0.2 %   Neutrophils Relative % 79 %   Neutro Abs 10.8 (H) 1.7 - 7.7 K/uL   Lymphocytes Relative 13 %   Lymphs Abs 1.8 0.7 - 4.0 K/uL   Monocytes Relative 6 %   Monocytes Absolute 0.8 0.1 - 1.0 K/uL   Eosinophils Relative 1 %   Eosinophils Absolute 0.2 0.0 - 0.5 K/uL   Basophils Relative 0 %   Basophils Absolute 0.1 0.0 - 0.1 K/uL   Immature Granulocytes 1 %   Abs Immature Granulocytes 0.10 (H) 0.00 - 0.07 K/uL  Comprehensive metabolic panel     Status: Abnormal   Collection Time: 02/26/23  8:00 PM  Result Value Ref Range   Sodium 122 (L) 135 - 145 mmol/L   Potassium 2.1 (LL) 3.5 - 5.1 mmol/L   Chloride 77 (L) 98 - 111 mmol/L   CO2 30 22 - 32 mmol/L   Glucose, Bld 139 (H) 70 - 99 mg/dL   BUN 81 (H) 8 - 23 mg/dL   Creatinine, Ser 6.96 (H) 0.61 - 1.24 mg/dL   Calcium 8.6 (L) 8.9 - 10.3 mg/dL   Total Protein 7.6 6.5 - 8.1 g/dL   Albumin 3.2 (L) 3.5 - 5.0 g/dL   AST 49 (H) 15 - 41 U/L    ALT 64 (H) 0 - 44 U/L   Alkaline Phosphatase 71 38 - 126 U/L   Total Bilirubin 1.1 0.3 - 1.2 mg/dL   GFR, Estimated 20 (L) >60 mL/min   Anion gap 15 5 - 15  Troponin I (High Sensitivity)     Status: Abnormal   Collection Time: 02/26/23  8:00 PM  Result Value Ref Range   Troponin I (High Sensitivity) 28 (H) <18 ng/L  SARS Coronavirus 2 by RT PCR (hospital order, performed in [facility] hospital lab) *cepheid single result test* Anterior Nasal Swab     Status: None   Collection Time: 02/26/23  8:05 PM   Specimen: Anterior Nasal Swab  Result Value Ref Range   SARS Coronavirus 2 by RT PCR NEGATIVE NEGATIVE  Urinalysis, Routine w reflex microscopic -Urine, Clean Catch     Status: Abnormal   Collection Time: 02/26/23  9:25 PM  Result Value Ref Range   Color, Urine STRAW (A) YELLOW   APPearance CLEAR CLEAR   Specific Gravity, Urine 1.005 1.005 - 1.030   pH 7.0 5.0 - 8.0   Glucose, UA NEGATIVE NEGATIVE mg/dL   Hgb urine dipstick NEGATIVE NEGATIVE   Bilirubin Urine NEGATIVE NEGATIVE   Ketones, ur NEGATIVE NEGATIVE mg/dL   Protein, ur NEGATIVE NEGATIVE mg/dL   Nitrite NEGATIVE NEGATIVE   Leukocytes,Ua NEGATIVE NEGATIVE    No results found.    Assessment and Plan: No notes have been filed under this hospital service. Service: Hospitalist  Principal Problem:   Acute renal failure superimposed on stage 3b chronic kidney disease, unspecified acute renal failure type (HCC) Active Problems:   Essential hypertension   Hypothyroidism   Chronic back pain  Hyponatremia   Hypokalemia  Acute renal failure superimposed on stage III chronic kidney disease IVF Check SCr tomorrow Hold Demadex Hyponatremia Secondary to dehydration IVF Recheck sodium every 4 hours Hypokalemia Due to severe hypokalemia, patient will need to be in stepdown Secondary to medications IV potassium runs P.o. potassium Check potassium in the morning. Leukocytosis This is likely secondary to dehydration.   No signs or symptoms of infection currently.  Will monitor closely and repeat CBC in the morning. If he develops fever or symptoms of infection, will obtain new set of labs and blood cultures. Elevated LFTs secondary to dehydration Secondary to dehydration. Hypertension Chronic pain Continue chronic pain medicine   Advance Care Planning:   Code Status: Full Code confirmed by patient  Consults: None  Family Communication: Daughter present during interview and exam  Severity of Illness: The appropriate patient status for this patient is INPATIENT. Inpatient status is judged to be reasonable and necessary in order to provide the required intensity of service to ensure the patient's safety. The patient's presenting symptoms, physical exam findings, and initial radiographic and laboratory data in the context of their chronic comorbidities is felt to place them at high risk for further clinical deterioration. Furthermore, it is not anticipated that the patient will be medically stable for discharge from the hospital within 2 midnights of admission.   * I certify that at the point of admission it is my clinical judgment that the patient will require inpatient hospital care spanning beyond 2 midnights from the point of admission due to high intensity of service, high risk for further deterioration and high frequency of surveillance required.*  Author: Levie Heritage, DO 02/26/2023 9:53 PM  For on call review [email or web address].

## 2023-02-27 DIAGNOSIS — E669 Obesity, unspecified: Secondary | ICD-10-CM

## 2023-02-27 DIAGNOSIS — E876 Hypokalemia: Secondary | ICD-10-CM | POA: Diagnosis not present

## 2023-02-27 DIAGNOSIS — N179 Acute kidney failure, unspecified: Secondary | ICD-10-CM

## 2023-02-27 DIAGNOSIS — E871 Hypo-osmolality and hyponatremia: Secondary | ICD-10-CM | POA: Diagnosis not present

## 2023-02-27 DIAGNOSIS — N1832 Chronic kidney disease, stage 3b: Secondary | ICD-10-CM

## 2023-02-27 DIAGNOSIS — I10 Essential (primary) hypertension: Secondary | ICD-10-CM

## 2023-02-27 LAB — BASIC METABOLIC PANEL
Anion gap: 10 (ref 5–15)
Anion gap: 13 (ref 5–15)
Anion gap: 10 (ref 5–15)
BUN: 76 mg/dL — ABNORMAL HIGH (ref 8–23)
BUN: 77 mg/dL — ABNORMAL HIGH (ref 8–23)
BUN: 70 mg/dL — ABNORMAL HIGH (ref 8–23)
CO2: 30 mmol/L (ref 22–32)
CO2: 29 mmol/L (ref 22–32)
CO2: 29 mmol/L (ref 22–32)
Calcium: 8.2 mg/dL — ABNORMAL LOW (ref 8.9–10.3)
Calcium: 8.4 mg/dL — ABNORMAL LOW (ref 8.9–10.3)
Calcium: 8.6 mg/dL — ABNORMAL LOW (ref 8.9–10.3)
Chloride: 83 mmol/L — ABNORMAL LOW (ref 98–111)
Chloride: 81 mmol/L — ABNORMAL LOW (ref 98–111)
Chloride: 84 mmol/L — ABNORMAL LOW (ref 98–111)
Creatinine, Ser: 2.8 mg/dL — ABNORMAL HIGH (ref 0.61–1.24)
Creatinine, Ser: 2.61 mg/dL — ABNORMAL HIGH (ref 0.61–1.24)
Creatinine, Ser: 2.39 mg/dL — ABNORMAL HIGH (ref 0.61–1.24)
GFR, Estimated: 23 mL/min — ABNORMAL LOW (ref >60)
GFR, Estimated: 25 mL/min — ABNORMAL LOW (ref >60)
GFR, Estimated: 28 mL/min — ABNORMAL LOW (ref >60)
Glucose, Bld: 116 mg/dL — ABNORMAL HIGH (ref 70–99)
Glucose, Bld: 115 mg/dL — ABNORMAL HIGH (ref 70–99)
Glucose, Bld: 88 mg/dL (ref 70–99)
Potassium: 2.4 mmol/L — CL (ref 3.5–5.1)
Potassium: 2.5 mmol/L — CL (ref 3.5–5.1)
Potassium: 2.7 mmol/L — CL (ref 3.5–5.1)
Sodium: 123 mmol/L — ABNORMAL LOW (ref 135–145)
Sodium: 123 mmol/L — ABNORMAL LOW (ref 135–145)
Sodium: 123 mmol/L — ABNORMAL LOW (ref 135–145)

## 2023-02-27 LAB — CBC
HCT: 35.2 % — ABNORMAL LOW (ref 39.0–52.0)
Hemoglobin: 12 g/dL — ABNORMAL LOW (ref 13.0–17.0)
MCH: 30.7 pg (ref 26.0–34.0)
MCHC: 34.1 g/dL (ref 30.0–36.0)
MCV: 90 fL (ref 80.0–100.0)
Platelets: 179 10*3/uL (ref 150–400)
RBC: 3.91 MIL/uL — ABNORMAL LOW (ref 4.22–5.81)
RDW: 14.6 % (ref 11.5–15.5)
WBC: 11.3 10*3/uL — ABNORMAL HIGH (ref 4.0–10.5)
nRBC: 0 % (ref 0.0–0.2)

## 2023-02-27 LAB — GLUCOSE, CAPILLARY: Glucose-Capillary: 102 mg/dL — ABNORMAL HIGH (ref 70–99)

## 2023-02-27 LAB — MAGNESIUM: Magnesium: 2.5 mg/dL — ABNORMAL HIGH (ref 1.7–2.4)

## 2023-02-27 LAB — MRSA NEXT GEN BY PCR, NASAL: MRSA by PCR Next Gen: NOT DETECTED

## 2023-02-27 MED ORDER — CALCITRIOL 0.25 MCG PO CAPS
0.2500 ug | ORAL_CAPSULE | ORAL | Status: DC
  Administered 2023-02-28 – 2023-03-01 (×2): 0.25 ug via ORAL
  Filled 2023-02-27 (×2): qty 1

## 2023-02-27 MED ORDER — POTASSIUM CHLORIDE IN NACL 20-0.9 MEQ/L-% IV SOLN: INTRAVENOUS | Status: DC

## 2023-02-27 MED ORDER — CHLORHEXIDINE GLUCONATE CLOTH 2 % EX PADS
6.0000 | MEDICATED_PAD | Freq: Every day | CUTANEOUS | Status: DC
  Administered 2023-02-27 – 2023-03-01 (×2): 6 via TOPICAL

## 2023-02-27 NOTE — Progress Notes (Signed)
Date and time results received: 02/27/23 0830 (use smartphrase ".now" to insert current time)  Test: Potassium  Critical Value: 2.5  Name of Provider Notified: Dr Tat  Orders Received? Or Actions Taken?:  Awaiting new orders, pt just got a PO dose also

## 2023-02-27 NOTE — Progress Notes (Signed)
Verbal report to Dr. Arbutus Leas of potassium of 2.7 called by lab.

## 2023-02-27 NOTE — Plan of Care (Signed)
  Problem: Education: Goal: Knowledge of General Education information will improve Description: Including pain rating scale, medication(s)/side effects and non-pharmacologic comfort measures 02/27/2023 1142 by Langley Adie, RN Outcome: Progressing 02/27/2023 1142 by Langley Adie, RN Outcome: Not Progressing   Problem: Health Behavior/Discharge Planning: Goal: Ability to manage health-related needs will improve 02/27/2023 1142 by Langley Adie, RN Outcome: Progressing 02/27/2023 1142 by Langley Adie, RN Outcome: Not Progressing   Problem: Clinical Measurements: Goal: Ability to maintain clinical measurements within normal limits will improve 02/27/2023 1142 by Langley Adie, RN Outcome: Progressing 02/27/2023 1142 by Langley Adie, RN Outcome: Not Progressing Goal: Will remain free from infection 02/27/2023 1142 by Langley Adie, RN Outcome: Progressing 02/27/2023 1142 by Langley Adie, RN Outcome: Not Progressing Goal: Diagnostic test results will improve 02/27/2023 1142 by Langley Adie, RN Outcome: Progressing 02/27/2023 1142 by Langley Adie, RN Outcome: Not Progressing Goal: Respiratory complications will improve 02/27/2023 1142 by Langley Adie, RN Outcome: Progressing 02/27/2023 1142 by Langley Adie, RN Outcome: Not Progressing Goal: Cardiovascular complication will be avoided 02/27/2023 1142 by Langley Adie, RN Outcome: Progressing 02/27/2023 1142 by Langley Adie, RN Outcome: Not Progressing   Problem: Activity: Goal: Risk for activity intolerance will decrease 02/27/2023 1142 by Langley Adie, RN Outcome: Progressing 02/27/2023 1142 by Langley Adie, RN Outcome: Not Progressing   Problem: Nutrition: Goal: Adequate nutrition will be maintained 02/27/2023 1142 by Langley Adie, RN Outcome: Progressing 02/27/2023 1142 by Langley Adie, RN Outcome: Not Progressing   Problem: Coping: Goal: Level of anxiety will decrease 02/27/2023  1142 by Langley Adie, RN Outcome: Progressing 02/27/2023 1142 by Langley Adie, RN Outcome: Not Progressing   Problem: Elimination: Goal: Will not experience complications related to bowel motility 02/27/2023 1142 by Langley Adie, RN Outcome: Progressing 02/27/2023 1142 by Langley Adie, RN Outcome: Not Progressing Goal: Will not experience complications related to urinary retention 02/27/2023 1142 by Langley Adie, RN Outcome: Progressing 02/27/2023 1142 by Langley Adie, RN Outcome: Not Progressing   Problem: Pain Managment: Goal: General experience of comfort will improve 02/27/2023 1142 by Langley Adie, RN Outcome: Progressing 02/27/2023 1142 by Langley Adie, RN Outcome: Not Progressing   Problem: Safety: Goal: Ability to remain free from injury will improve 02/27/2023 1142 by Langley Adie, RN Outcome: Progressing 02/27/2023 1142 by Langley Adie, RN Outcome: Not Progressing   Problem: Skin Integrity: Goal: Risk for impaired skin integrity will decrease 02/27/2023 1142 by Langley Adie, RN Outcome: Progressing 02/27/2023 1142 by Langley Adie, RN Outcome: Not Progressing

## 2023-02-27 NOTE — Progress Notes (Addendum)
PROGRESS NOTE  Andrew Phelps NFA:[id number] DOB: 1945/12/21 DOA: 02/26/2023 PCP: Junie Spencer, FNP  Brief History:  77 year old male with a history of hypertension, hyperlipidemia, CKD stage IIIb, asthma, chronic low back pain with opioid dependence, hypothyroidism, and alcohol dependence in remission presenting with 3-week history of generalized weakness and malaise/general gotten worse.  He states that over the past 1 to 2 days he has had difficulty making transfers because his legs feeling weak.  He was not able to walk very far because of generalized weakness and fatigue.  The patient had a similar hospital admission from 07/15/2022 to 07/17/2022.  At at that discharge in January, the patient's torsemide was decreased to 50 mg daily.  At the time of that discharge, his weight was 273 pounds he followed up with his nephrologist, Dr. Wolfgang Phoenix, on 08/09/22.  At that time, the patient's torsemide was changed to 100 mg on alternating days with 50 mg dosing.Marland Kitchen  He subsequently followed up in the office again with nephrology on 10/06/2022.  At that time, the patient was instructed to increase his torsemide to 100 mg every day and as his office weight had increased from 273 pounds to 288 pounds. At some point thereafter, the patient stated that he was instructed to restart his metolazone.  Patient states that because he was continuing to feel weak, he stopped taking his metolazone about 4 days prior to this admission. Patient denies fevers, chills, headache, chest pain, dyspnea, nausea, vomiting, diarrhea, abdominal pain, dysuria, hematuria, hematochezia, and melena. The patient denies any NSAIDs.  He denies any other new medicines. In the ED, the patient was afebrile and hemodynamically stable albeit with soft blood pressures.  Oxygen saturation was 98% room air.  WBC 11.3, hemoglobin 12.0, platelets 179,000.  Sodium 122, potassium 2.1, bicarbonate 30, serum creatinine 3.08.  The patient was  started on IV fluids and admitted for further evaluation and treatment.   Assessment/Plan: Acute on chronic renal failure--CKD3b -Baseline creatinine 1.7-2.0 -presented with serum creatinine 3.08 -Secondary to hemodynamic changes in the setting of torsemide and metolazone -Holding metolazone and torsemide temporarily -Judicious IV fluids for the next 24 hours -instructed pt to stop metolazone -02/27/2023 standing weight--266.7 pounds -Will ultimately need close follow-up with Dr. Wolfgang Phoenix  Hypokalemia -Replete -Check magnesium--2.5   Essential hypertension -Previously on valsartan which was discontinued on his visit on 07/13/2022 -Blood pressure has been soft   Opioid dependence -PDMP reviewed-- -patient receives hydrocodone 5/325, #60 monthly, last refill 02/20/2023 -Continue gabapentin at lower dose   Hyperlipidemia -Continue simvastatin   Hypothyroidism -Continue Synthroid   Hyponatremia -Secondary to acute kidney injury and metolazone -Monitor serial BMP   Class II obesity -BMI 37.21 -Lifestyle modification   History of alcohol dependence--in remission -quit nearly 4 years ago -drank approx ten x 20 oz beers daily for many years         Family Communication:   Family at bedside  Consultants:  none  Code Status:  FULL   DVT Prophylaxis:  [place] Lovenox   Procedures: As Listed in Progress Note Above  Antibiotics: None      Subjective: Patient denies fevers, chills, headache, chest pain, dyspnea, nausea, vomiting, diarrhea, abdominal pain, dysuria, hematuria, hematochezia, and melena.   Objective: Vitals:   02/27/23 1000 02/27/23 1100 02/27/23 1148 02/27/23 1150  BP: (!) 114/58 104/65    Pulse: 61 (!) 58    Resp:      Temp:   97.8  F (36.6 C)   TempSrc:   Axillary   SpO2: 97% 97%    Weight:    121 kg  Height:        Intake/Output Summary (Last 24 hours) at 02/27/2023 1205 Last data filed at 02/27/2023 1136 Gross per 24 hour  Intake 1987.81  ml  Output 3025 ml  Net -1037.19 ml   Weight change:  Exam:  General:  Pt is alert, follows commands appropriately, not in acute distress HEENT: No icterus, No thrush, No neck mass, [place]/AT Cardiovascular: RRR, S1/S2, no rubs, no gallops Respiratory: CTA bilaterally, no wheezing, no crackles, no rhonchi Abdomen: Soft/+BS, non tender, non distended, no guarding Extremities: No edema, No lymphangitis, No petechiae, No rashes, no synovitis   Data Reviewed: I have personally reviewed following labs and imaging studies Basic Metabolic Panel: Recent Labs  Lab 02/26/23 2000 02/26/23 2249 02/27/23 0415 02/27/23 0758  NA 122* 122* 123* 123*  K 2.1* 2.1* 2.4* 2.5*  CL 77* 78* 83* 81*  CO2 30 29 30 29   GLUCOSE 139* 92 116* 115*  BUN 81* 81* 76* 77*  CREATININE 3.08* 2.95* 2.80* 2.61*  CALCIUM 8.6* 8.4* 8.2* 8.4*  MG  --   --  2.5*  --    Liver Function Tests: Recent Labs  Lab 02/26/23 2000  AST 49*  ALT 64*  ALKPHOS 71  BILITOT 1.1  PROT 7.6  ALBUMIN 3.2*   No results for input(s): "LIPASE", "AMYLASE" in the last 168 hours. No results for input(s): "AMMONIA" in the last 168 hours. Coagulation Profile: No results for input(s): "INR", "PROTIME" in the last 168 hours. CBC: Recent Labs  Lab 02/26/23 2000 02/27/23 0415  WBC 13.8* 11.3*  NEUTROABS 10.8*  --   HGB 13.0 12.0*  HCT 37.1* 35.2*  MCV 88.3 90.0  PLT 211 179   Cardiac Enzymes: No results for input(s): "CKTOTAL", "CKMB", "CKMBINDEX", "TROPONINI" in the last 168 hours. BNP: Invalid input(s): "POCBNP" CBG: Recent Labs  Lab 02/27/23 0716  GLUCAP 102*   HbA1C: No results for input(s): "HGBA1C" in the last 72 hours. Urine analysis:    Component Value Date/Time   COLORURINE STRAW (A) 02/26/2023 2125   APPEARANCEUR CLEAR 02/26/2023 2125   APPEARANCEUR Clear 07/13/2016 1542   LABSPEC 1.005 02/26/2023 2125   PHURINE 7.0 02/26/2023 2125   GLUCOSEU NEGATIVE 02/26/2023 2125   HGBUR NEGATIVE 02/26/2023 2125    BILIRUBINUR NEGATIVE 02/26/2023 2125   BILIRUBINUR Negative 07/13/2016 1542   KETONESUR NEGATIVE 02/26/2023 2125   PROTEINUR NEGATIVE 02/26/2023 2125   UROBILINOGEN negative 03/21/2014 0838   NITRITE NEGATIVE 02/26/2023 2125   LEUKOCYTESUR NEGATIVE 02/26/2023 2125   Sepsis Labs: @LABRCNTIP (procalcitonin:4,lacticidven:4) ) Recent Results (from the past 240 hour(s))  SARS Coronavirus 2 by RT PCR (hospital order, performed in [facility] hospital lab) *cepheid single result test* Anterior Nasal Swab     Status: None   Collection Time: 02/26/23  8:05 PM   Specimen: Anterior Nasal Swab  Result Value Ref Range Status   SARS Coronavirus 2 by RT PCR NEGATIVE NEGATIVE Final    Comment: (NOTE) SARS-CoV-2 target nucleic acids are NOT DETECTED.  The SARS-CoV-2 RNA is generally detectable in upper and lower respiratory specimens during the acute phase of infection. The lowest concentration of SARS-CoV-2 viral copies this assay can detect is 250 copies / mL. A negative result does not preclude SARS-CoV-2 infection and should not be used as the sole basis for treatment or other patient management decisions.  A negative result may  occur with improper specimen collection / handling, submission of specimen other than nasopharyngeal swab, presence of viral mutation(s) within the areas targeted by this assay, and inadequate number of viral copies (<250 copies / mL). A negative result must be combined with clinical observations, patient history, and epidemiological information.  Fact Sheet for Patients:   RoadLapTop.co.za  Fact Sheet for Healthcare Providers: http://kim-miller.com/  This test is not yet approved or  cleared by the [place] FDA and has been authorized for detection and/or diagnosis of SARS-CoV-2 by FDA under an Emergency Use Authorization (EUA).  This EUA will remain in effect (meaning this test can be used) for the duration of  the COVID-19 declaration under Section 564(b)(1) of the Act, 21 U.S.C. section 360bbb-3(b)(1), unless the authorization is terminated or revoked sooner.  Performed at [facility], [address], [place], [place] [address]   MRSA Next Gen by PCR, Nasal     Status: None   Collection Time: 02/26/23 11:06 PM   Specimen: Nasal Mucosa; Nasal Swab  Result Value Ref Range Status   MRSA by PCR Next Gen NOT DETECTED NOT DETECTED Final    Comment: (NOTE) The GeneXpert MRSA Assay (FDA approved for NASAL specimens only), is one component of a comprehensive MRSA colonization surveillance program. It is not intended to diagnose MRSA infection nor to guide or monitor treatment for MRSA infections. Test performance is not FDA approved in patients less than 46 years old. Performed at [facility], [address], [place], [place] [address]      Scheduled Meds:  allopurinol  300 mg Oral Daily   calcitRIOL  0.25 mcg Oral Daily   Chlorhexidine Gluconate Cloth  6 each Topical Daily   enoxaparin (LOVENOX) injection  30 mg Subcutaneous Q24H   fluticasone  2 spray Each Nare Daily   gabapentin  600 mg Oral TID   levothyroxine  112 mcg Oral Q0600   montelukast  10 mg Oral QHS   potassium chloride  40 mEq Oral TID   simvastatin  20 mg Oral Daily   Continuous Infusions:  sodium chloride 125 mL/hr at 02/27/23 0520   sodium chloride      Procedures/Studies: No results found.  Catarina Hartshorn, DO  [facility]  If 7PM-7AM, please contact night-coverage [email or web address] Password [email or web address] 02/27/2023, 12:05 PM   LOS: 1 day

## 2023-02-27 NOTE — Hospital Course (Addendum)
77 year old male with a history of hypertension, hyperlipidemia, CKD stage IIIb, asthma, chronic low back pain with opioid dependence, hypothyroidism, and alcohol dependence in remission presenting with 3-week history of generalized weakness and malaise/general gotten worse.  He states that over the past 1 to 2 days he has had difficulty making transfers because his legs feeling weak.  He was not able to walk very far because of generalized weakness and fatigue.  The patient had a similar hospital admission from 07/15/2022 to 07/17/2022.  At at that discharge in January, the patient's torsemide was decreased to 50 mg daily.  At the time of that discharge, his weight was 273 pounds he followed up with his nephrologist, Dr. Wolfgang Phoenix, on 08/09/22.  At that time, the patient's torsemide was changed to 100 mg on alternating days with 50 mg dosing.Marland Kitchen  He subsequently followed up in the office again with nephrology on 10/06/2022.  At that time, the patient was instructed to increase his torsemide to 100 mg every day and as his office weight had increased from 273 pounds to 288 pounds. At some point thereafter, the patient stated that he was instructed to restart his metolazone.  Patient states that because he was continuing to feel weak, he stopped taking his metolazone about 4 days prior to this admission. Patient denies fevers, chills, headache, chest pain, dyspnea, nausea, vomiting, diarrhea, abdominal pain, dysuria, hematuria, hematochezia, and melena. The patient denies any NSAIDs.  He denies any other new medicines. In the ED, the patient was afebrile and hemodynamically stable albeit with soft blood pressures.  Oxygen saturation was 98% room air.  WBC 11.3, hemoglobin 12.0, platelets 179,000.  Sodium 122, potassium 2.1, bicarbonate 30, serum creatinine 3.08.  The patient was started on IV fluids and admitted for further evaluation and treatment.

## 2023-02-28 DIAGNOSIS — N1832 Chronic kidney disease, stage 3b: Secondary | ICD-10-CM | POA: Diagnosis not present

## 2023-02-28 DIAGNOSIS — N179 Acute kidney failure, unspecified: Secondary | ICD-10-CM | POA: Diagnosis not present

## 2023-02-28 DIAGNOSIS — E871 Hypo-osmolality and hyponatremia: Secondary | ICD-10-CM | POA: Diagnosis not present

## 2023-02-28 DIAGNOSIS — E669 Obesity, unspecified: Secondary | ICD-10-CM | POA: Diagnosis not present

## 2023-02-28 LAB — COMPREHENSIVE METABOLIC PANEL
ALT: 56 U/L — ABNORMAL HIGH (ref 0–44)
AST: 44 U/L — ABNORMAL HIGH (ref 15–41)
Albumin: 2.9 g/dL — ABNORMAL LOW (ref 3.5–5.0)
Alkaline Phosphatase: 61 U/L (ref 38–126)
Anion gap: 12 (ref 5–15)
BUN: 65 mg/dL — ABNORMAL HIGH (ref 8–23)
CO2: 27 mmol/L (ref 22–32)
Calcium: 8.5 mg/dL — ABNORMAL LOW (ref 8.9–10.3)
Chloride: 87 mmol/L — ABNORMAL LOW (ref 98–111)
Creatinine, Ser: 2.18 mg/dL — ABNORMAL HIGH (ref 0.61–1.24)
GFR, Estimated: 31 mL/min — ABNORMAL LOW (ref >60)
Glucose, Bld: 95 mg/dL (ref 70–99)
Potassium: 3.4 mmol/L — ABNORMAL LOW (ref 3.5–5.1)
Sodium: 126 mmol/L — ABNORMAL LOW (ref 135–145)
Total Bilirubin: 1.1 mg/dL (ref 0.3–1.2)
Total Protein: 6.8 g/dL (ref 6.5–8.1)

## 2023-02-28 LAB — CBC
HCT: 35.8 % — ABNORMAL LOW (ref 39.0–52.0)
Hemoglobin: 12 g/dL — ABNORMAL LOW (ref 13.0–17.0)
MCH: 30.8 pg (ref 26.0–34.0)
MCHC: 33.5 g/dL (ref 30.0–36.0)
MCV: 91.8 fL (ref 80.0–100.0)
Platelets: 192 10*3/uL (ref 150–400)
RBC: 3.9 MIL/uL — ABNORMAL LOW (ref 4.22–5.81)
RDW: 14.7 % (ref 11.5–15.5)
WBC: 12.1 10*3/uL — ABNORMAL HIGH (ref 4.0–10.5)
nRBC: 0 % (ref 0.0–0.2)

## 2023-02-28 LAB — MAGNESIUM: Magnesium: 2.3 mg/dL (ref 1.7–2.4)

## 2023-02-28 MED ORDER — POTASSIUM CHLORIDE CRYS ER 20 MEQ PO TBCR
40.0000 meq | EXTENDED_RELEASE_TABLET | Freq: Once | ORAL | Status: AC
  Administered 2023-02-28: 40 meq via ORAL
  Filled 2023-02-28: qty 2

## 2023-02-28 MED ORDER — ENOXAPARIN SODIUM 60 MG/0.6ML IJ SOSY
0.5000 mg/kg | PREFILLED_SYRINGE | INTRAMUSCULAR | Status: DC
  Administered 2023-03-01: 62.5 mg via SUBCUTANEOUS
  Filled 2023-02-28: qty 1.2

## 2023-02-28 NOTE — TOC Initial Note (Signed)
Transition of Care [facility]) - Initial/Assessment Note    Patient Details  Name: Andrew Phelps MRN: [id number] Date of Birth: 1945/08/17  Transition of Care [facility]) CM/SW Contact:    Villa Herb, LCSWA Phone Number: 02/28/2023, 9:04 AM  Clinical Narrative:                 Pt is high risk for readmission. CSW spoke with pt to complete assessment. Pt states that he lives alone but has family right beside him. Pt states that he is independent in completing his ADLs. Pt states that he has transportation provided when needed. Pt states that he has a cane and walker to use in the home when needed. Pt states that he has had HH in the past. TOC to follow.   Expected Discharge Plan: Home/Self Care Barriers to Discharge: Continued Medical Work up   Patient Goals and CMS Choice Patient states their goals for this hospitalization and ongoing recovery are:: return home CMS Medicare.gov Compare Post Acute Care list provided to:: Patient Choice offered to / list presented to : Patient      Expected Discharge Plan and Services In-house Referral: Clinical Social Work Discharge Planning Services: CM Consult   Living arrangements for the past 2 months: Single Family Home                                      Prior Living Arrangements/Services Living arrangements for the past 2 months: Single Family Home Lives with:: Self Patient language and need for interpreter reviewed:: Yes Do you feel safe going back to the place where you live?: Yes      Need for Family Participation in Patient Care: Yes (Comment) Care giver support system in place?: Yes (comment) Current home services: DME Criminal Activity/Legal Involvement Pertinent to Current Situation/Hospitalization: No - Comment as needed  Activities of Daily Living Home Assistive Devices/Equipment: Cane (specify quad or straight), Eyeglasses, Dentures (specify type), Walker (specify type) ADL Screening (condition at time of  admission) Patient's cognitive ability adequate to safely complete daily activities?: Yes Is the patient deaf or have difficulty hearing?: Yes Does the patient have difficulty seeing, even when wearing glasses/contacts?: No Does the patient have difficulty concentrating, remembering, or making decisions?: No Patient able to express need for assistance with ADLs?: Yes Does the patient have difficulty dressing or bathing?: No Independently performs ADLs?: Yes (appropriate for developmental age) Does the patient have difficulty walking or climbing stairs?: Yes Weakness of Legs: Both Weakness of Arms/Hands: None  Permission Sought/Granted                  Emotional Assessment Appearance:: Appears stated age Attitude/Demeanor/Rapport: Engaged Affect (typically observed): Accepting Orientation: : Oriented to Self, Oriented to Place, Oriented to  Time, Oriented to Situation Alcohol / Substance Use: Not Applicable Psych Involvement: No (comment)  Admission diagnosis:  Hypokalemia [E87.6] Hyponatremia [E87.1] AKI (acute kidney injury) (HCC) [N17.9] Acute kidney injury (HCC) [N17.9] Patient Active Problem List   Diagnosis Date Noted   Hypokalemia 02/26/2023   Acute renal failure superimposed on stage 3b chronic kidney disease, unspecified acute renal failure type (HCC) 07/15/2022   Hyponatremia 07/15/2022   Class 2 obesity 07/15/2022   CKD (chronic kidney disease) stage 3, GFR 30-59 ml/min (HCC) 07/03/2019   Heme positive stool 08/03/2017   Anemia 08/03/2017   Gout 11/18/2016   Unilateral primary osteoarthritis, right knee 10/07/2016  Elevated bilirubin 08/12/2016   Pain medication agreement signed 02/06/2016   Opioid dependence (HCC) 02/06/2016   Morbid obesity (HCC) 01/02/2016   Peripheral neuropathy 01/02/2016   Chronic back pain 01/02/2016   Essential hypertension 12/11/2014   Hypothyroidism 12/11/2014   Allergic rhinitis 12/11/2014   Insomnia 12/11/2014   Arthritis  12/11/2014   Hyperlipidemia 12/11/2014   Asthma, chronic 12/11/2014   PCP:  Junie Spencer, FNP Pharmacy:   [facility] - [place] - [address] [place] [address] Phone: [phone] Fax: [phone]     Social Determinants of Health (SDOH) Social History: SDOH Screenings   Food Insecurity: No Food Insecurity (02/26/2023)  Housing: Low Risk  (02/26/2023)  Transportation Needs: No Transportation Needs (02/26/2023)  Utilities: Not At Risk (02/26/2023)  Alcohol Screen: Low Risk  (10/18/2022)  Depression (PHQ2-9): Low Risk  (01/07/2023)  Financial Resource Strain: Low Risk  (10/18/2022)  Physical Activity: Sufficiently Active (10/18/2022)  Social Connections: Socially Isolated (10/18/2022)  Stress: No Stress Concern Present (10/18/2022)  Tobacco Use: High Risk (02/26/2023)   SDOH Interventions:     Readmission Risk Interventions    02/28/2023    9:03 AM  Readmission Risk Prevention Plan  Transportation Screening Complete  HRI or Home Care Consult Complete  Social Work Consult for Recovery Care Planning/Counseling Complete  Palliative Care Screening Not Applicable  Medication Review Oceanographer) Complete

## 2023-02-28 NOTE — Progress Notes (Signed)
PROGRESS NOTE  Andrew Phelps MWN:[id number] DOB: 03-26-1946 DOA: 02/26/2023 PCP: Junie Spencer, FNP  Brief History:  77 year old male with a history of hypertension, hyperlipidemia, CKD stage IIIb, asthma, chronic low back pain with opioid dependence, hypothyroidism, and alcohol dependence in remission presenting with 3-week history of generalized weakness and malaise/general gotten worse.  He states that over the past 1 to 2 days he has had difficulty making transfers because his legs feeling weak.  He was not able to walk very far because of generalized weakness and fatigue.  The patient had a similar hospital admission from 07/15/2022 to 07/17/2022.  At at that discharge in January, the patient's torsemide was decreased to 50 mg daily.  At the time of that discharge, his weight was 273 pounds he followed up with his nephrologist, Dr. Wolfgang Phoenix, on 08/09/22.  At that time, the patient's torsemide was changed to 100 mg on alternating days with 50 mg dosing.Marland Kitchen  He subsequently followed up in the office again with nephrology on 10/06/2022.  At that time, the patient was instructed to increase his torsemide to 100 mg every day and as his office weight had increased from 273 pounds to 288 pounds. At some point thereafter, the patient stated that he was instructed to restart his metolazone.  Patient states that because he was continuing to feel weak, he stopped taking his metolazone about 4 days prior to this admission. Patient denies fevers, chills, headache, chest pain, dyspnea, nausea, vomiting, diarrhea, abdominal pain, dysuria, hematuria, hematochezia, and melena. The patient denies any NSAIDs.  He denies any other new medicines. In the ED, the patient was afebrile and hemodynamically stable albeit with soft blood pressures.  Oxygen saturation was 98% room air.  WBC 11.3, hemoglobin 12.0, platelets 179,000.  Sodium 122, potassium 2.1, bicarbonate 30, serum creatinine 3.08.  The patient was  started on IV fluids and admitted for further evaluation and treatment.   Assessment/Plan: Acute on chronic renal failure--CKD3b -Baseline creatinine 1.7-2.0 -presented with serum creatinine 3.08 -Secondary to hemodynamic changes in the setting of torsemide and metolazone -Holding metolazone and torsemide temporarily -Judicious IV fluids for the next 24 hours -instructed pt to stop metolazone -02/27/2023 standing weight--266.7 pounds -Will ultimately need close follow-up with Dr. Wolfgang Phoenix   Hypokalemia -Replete -Check magnesium--2.3   Essential hypertension -Previously on valsartan which was discontinued on his visit on 07/13/2022 -Blood pressure has been soft   Opioid dependence -PDMP reviewed-- -patient receives hydrocodone 5/325, #60 monthly, last refill 02/20/2023 -Continue gabapentin at lower dose   Hyperlipidemia -Continue simvastatin   Hypothyroidism -Continue Synthroid   Hyponatremia -Secondary to acute kidney injury and metolazone -Monitor serial BMP   Class II obesity -BMI 37.21 -Lifestyle modification   History of alcohol dependence--in remission -quit nearly 4 years ago -drank approx ten x 20 oz beers daily for many years                 Family Communication:  no Family at bedside   Consultants:  none   Code Status:  FULL    DVT Prophylaxis:  [place] Lovenox     Procedures: As Listed in Progress Note Above   Antibiotics: None     Subjective: Patient denies fevers, chills, headache, chest pain, dyspnea, nausea, vomiting, diarrhea, abdominal pain, dysuria, hematuria, hematochezia, and melena.   Objective: Vitals:   02/28/23 1144 02/28/23 1200 02/28/23 1247 02/28/23 1812  BP:  120/68 127/61 131/79  Pulse:  75 77  86  Resp:  (!) 22 18   Temp: 98.1 F (36.7 C)  97.8 F (36.6 C) 98 F (36.7 C)  TempSrc: Oral  Oral Oral  SpO2:  98% 100% 100%  Weight:      Height:        Intake/Output Summary (Last 24 hours) at 02/28/2023 1828 Last data  filed at 02/28/2023 1700 Gross per 24 hour  Intake 1658.33 ml  Output 2100 ml  Net -441.67 ml   Weight change: 4.427 kg Exam:  General:  Pt is alert, follows commands appropriately, not in acute distress HEENT: No icterus, No thrush, No neck mass, Azure/AT Cardiovascular: RRR, S1/S2, no rubs, no gallops Respiratory: CTA bilaterally, no wheezing, no crackles, no rhonchi Abdomen: Soft/+BS, non tender, non distended, no guarding Extremities: No edema, No lymphangitis, No petechiae, No rashes, no synovitis   Data Reviewed: I have personally reviewed following labs and imaging studies Basic Metabolic Panel: Recent Labs  Lab 02/26/23 2249 02/27/23 0415 02/27/23 0758 02/27/23 1218 02/28/23 0409  NA 122* 123* 123* 123* 126*  K 2.1* 2.4* 2.5* 2.7* 3.4*  CL 78* 83* 81* 84* 87*  CO2 29 30 29 29 27   GLUCOSE 92 116* 115* 88 95  BUN 81* 76* 77* 70* 65*  CREATININE 2.95* 2.80* 2.61* 2.39* 2.18*  CALCIUM 8.4* 8.2* 8.4* 8.6* 8.5*  MG  --  2.5*  --   --  2.3   Liver Function Tests: Recent Labs  Lab 02/26/23 2000 02/28/23 0409  AST 49* 44*  ALT 64* 56*  ALKPHOS 71 61  BILITOT 1.1 1.1  PROT 7.6 6.8  ALBUMIN 3.2* 2.9*   No results for input(s): "LIPASE", "AMYLASE" in the last 168 hours. No results for input(s): "AMMONIA" in the last 168 hours. Coagulation Profile: No results for input(s): "INR", "PROTIME" in the last 168 hours. CBC: Recent Labs  Lab 02/26/23 2000 02/27/23 0415 02/28/23 0409  WBC 13.8* 11.3* 12.1*  NEUTROABS 10.8*  --   --   HGB 13.0 12.0* 12.0*  HCT 37.1* 35.2* 35.8*  MCV 88.3 90.0 91.8  PLT 211 179 192   Cardiac Enzymes: No results for input(s): "CKTOTAL", "CKMB", "CKMBINDEX", "TROPONINI" in the last 168 hours. BNP: Invalid input(s): "POCBNP" CBG: Recent Labs  Lab 02/27/23 0716  GLUCAP 102*   HbA1C: No results for input(s): "HGBA1C" in the last 72 hours. Urine analysis:    Component Value Date/Time   COLORURINE STRAW (A) 02/26/2023 2125    APPEARANCEUR CLEAR 02/26/2023 2125   APPEARANCEUR Clear 07/13/2016 1542   LABSPEC 1.005 02/26/2023 2125   PHURINE 7.0 02/26/2023 2125   GLUCOSEU NEGATIVE 02/26/2023 2125   HGBUR NEGATIVE 02/26/2023 2125   BILIRUBINUR NEGATIVE 02/26/2023 2125   BILIRUBINUR Negative 07/13/2016 1542   KETONESUR NEGATIVE 02/26/2023 2125   PROTEINUR NEGATIVE 02/26/2023 2125   UROBILINOGEN negative 03/21/2014 0838   NITRITE NEGATIVE 02/26/2023 2125   LEUKOCYTESUR NEGATIVE 02/26/2023 2125   Sepsis Labs: @LABRCNTIP (procalcitonin:4,lacticidven:4) ) Recent Results (from the past 240 hour(s))  SARS Coronavirus 2 by RT PCR (hospital order, performed in [facility] hospital lab) *cepheid single result test* Anterior Nasal Swab     Status: None   Collection Time: 02/26/23  8:05 PM   Specimen: Anterior Nasal Swab  Result Value Ref Range Status   SARS Coronavirus 2 by RT PCR NEGATIVE NEGATIVE Final    Comment: (NOTE) SARS-CoV-2 target nucleic acids are NOT DETECTED.  The SARS-CoV-2 RNA is generally detectable in upper and lower respiratory specimens during the acute  phase of infection. The lowest concentration of SARS-CoV-2 viral copies this assay can detect is 250 copies / mL. A negative result does not preclude SARS-CoV-2 infection and should not be used as the sole basis for treatment or other patient management decisions.  A negative result may occur with improper specimen collection / handling, submission of specimen other than nasopharyngeal swab, presence of viral mutation(s) within the areas targeted by this assay, and inadequate number of viral copies (<250 copies / mL). A negative result must be combined with clinical observations, patient history, and epidemiological information.  Fact Sheet for Patients:   RoadLapTop.co.za  Fact Sheet for Healthcare Providers: http://kim-miller.com/  This test is not yet approved or  cleared by the [place] FDA  and has been authorized for detection and/or diagnosis of SARS-CoV-2 by FDA under an Emergency Use Authorization (EUA).  This EUA will remain in effect (meaning this test can be used) for the duration of the COVID-19 declaration under Section 564(b)(1) of the Act, 21 U.S.C. section 360bbb-3(b)(1), unless the authorization is terminated or revoked sooner.  Performed at [facility], [address] [place], [place] [address]   MRSA Next Gen by PCR, Nasal     Status: None   Collection Time: 02/26/23 11:06 PM   Specimen: Nasal Mucosa; Nasal Swab  Result Value Ref Range Status   MRSA by PCR Next Gen NOT DETECTED NOT DETECTED Final    Comment: (NOTE) The GeneXpert MRSA Assay (FDA approved for NASAL specimens only), is one component of a comprehensive MRSA colonization surveillance program. It is not intended to diagnose MRSA infection nor to guide or monitor treatment for MRSA infections. Test performance is not FDA approved in patients less than 71 years old. Performed at [facility], [address] [place], [place] [address]      Scheduled Meds:  allopurinol  300 mg Oral Daily   calcitRIOL  0.25 mcg Oral Once per day on Monday Wednesday Saturday   Chlorhexidine Gluconate Cloth  6 each Topical Daily   [START ON 03/01/2023] enoxaparin (LOVENOX) injection  0.5 mg/kg Subcutaneous Q24H   fluticasone  2 spray Each Nare Daily   gabapentin  600 mg Oral TID   levothyroxine  112 mcg Oral Q0600   montelukast  10 mg Oral QHS   simvastatin  20 mg Oral Daily   Continuous Infusions:  sodium chloride      Procedures/Studies: No results found.  Catarina Hartshorn, DO  [facility]  If 7PM-7AM, please contact night-coverage [email or web address] Password [email or web address] 02/28/2023, 6:28 PM   LOS: 2 days ]

## 2023-02-28 NOTE — Progress Notes (Signed)
PHARMACIST - PHYSICIAN COMMUNICATION  CONCERNING:  Enoxaparin (Lovenox) for DVT Prophylaxis   ASSESSMENT: Patient was prescribed enoxaparin 30 mg subcutaneously every 24 hours for VTE prophylaxis.   Body mass index is 37.85 kg/m.  Estimated Creatinine Clearance: 39.1 mL/min (A) (by C-G formula based on SCr of 2.18 mg/dL (H)).  Based on [facility] policy, patient qualifies for enoxaparin dosing of 0.5 mg per kilogram of total body weight every 24 hours because their body mass index is >30 kg/m2.  PLAN: Pharmacy has adjusted enoxaparin dose per [facility] policy.  Description: Patient is now receiving enoxaparin 0.5 mg/kg subcutaneously every 24 hours.  Will M. Dareen Piano, PharmD Clinical Pharmacist 02/28/2023 3:58 PM

## 2023-02-28 NOTE — Progress Notes (Signed)
Nutrition Note  Patient noted to have a Malnutrition Screening Tool score of 3. RD spoke with patient at bedside. He reports that he has been eating well, but his weight has declined a lot over the past few weeks. Patient has a hx of CKD and fluid fluctuations related to renal insufficiency. He currently does not follow any specific diet at home. He has a good appetite and has been eating well at home. He suspects fluids are the cause of his weight loss.   RD provided "Low Sodium Nutrition Therapy" handout from the Academy of Nutrition and Dietetics. Reviewed patient's dietary recall. Provided examples on ways to decrease sodium intake in diet. Discouraged intake of processed foods and use of salt shaker. Encouraged fresh fruits and vegetables as well as whole grain sources of carbohydrates to maximize fiber intake.   Expect good compliance. Patient was able to state a few changes he can make to decrease his sodium intake.   Nutrition focused physical exam completed: Flowsheet Row Most Recent Value  Orbital Region No depletion  Upper Arm Region No depletion  Thoracic and Lumbar Region No depletion  Buccal Region No depletion  Temple Region No depletion  Clavicle Bone Region No depletion  Clavicle and Acromion Bone Region No depletion  Scapular Bone Region No depletion  Dorsal Hand No depletion  Patellar Region No depletion  Anterior Thigh Region No depletion  Posterior Calf Region No depletion  Edema (RD Assessment) Moderate  Hair Reviewed  Eyes Reviewed  Mouth Reviewed  Skin Reviewed  Nails Reviewed        Body mass index is 37.85 kg/m. Pt meets criteria for obesity based on current BMI.  Current diet order is heart healthy, patient is consuming 75-100% of meals at this time. Labs and medications reviewed. No further nutrition interventions warranted at this time. RD contact information provided. If additional nutrition issues arise, please re-consult RD.    Gabriel Rainwater RD,  LDN, CNSC Please refer to Amion for contact information.

## 2023-03-01 DIAGNOSIS — E871 Hypo-osmolality and hyponatremia: Secondary | ICD-10-CM | POA: Diagnosis not present

## 2023-03-01 DIAGNOSIS — E669 Obesity, unspecified: Secondary | ICD-10-CM | POA: Diagnosis not present

## 2023-03-01 DIAGNOSIS — N179 Acute kidney failure, unspecified: Secondary | ICD-10-CM | POA: Diagnosis not present

## 2023-03-01 DIAGNOSIS — E876 Hypokalemia: Secondary | ICD-10-CM | POA: Diagnosis not present

## 2023-03-01 LAB — BASIC METABOLIC PANEL
Anion gap: 10 (ref 5–15)
BUN: 51 mg/dL — ABNORMAL HIGH (ref 8–23)
CO2: 23 mmol/L (ref 22–32)
Calcium: 8.2 mg/dL — ABNORMAL LOW (ref 8.9–10.3)
Chloride: 94 mmol/L — ABNORMAL LOW (ref 98–111)
Creatinine, Ser: 1.78 mg/dL — ABNORMAL HIGH (ref 0.61–1.24)
GFR, Estimated: 39 mL/min — ABNORMAL LOW (ref >60)
Glucose, Bld: 94 mg/dL (ref 70–99)
Potassium: 3.8 mmol/L (ref 3.5–5.1)
Sodium: 127 mmol/L — ABNORMAL LOW (ref 135–145)

## 2023-03-01 MED ORDER — TORSEMIDE 20 MG PO TABS: 50.0000 mg | ORAL_TABLET | Freq: Every day | ORAL | Status: DC

## 2023-03-01 NOTE — Discharge Summary (Signed)
Physician Discharge Summary   Patient: Andrew Phelps MRN: [id number] DOB: September 23, 1945  Admit date:     02/26/2023  Discharge date: 03/01/23  Discharge Physician: Onalee Hua Graycee Greeson   PCP: Junie Spencer, FNP   Recommendations at discharge:   Please follow up with primary care provider within 1-2 weeks  Please repeat BMP and CBC in one week     Hospital Course: 77 year old male with a history of hypertension, hyperlipidemia, CKD stage IIIb, asthma, chronic low back pain with opioid dependence, hypothyroidism, and alcohol dependence in remission presenting with 3-week history of generalized weakness and malaise/general gotten worse.  He states that over the past 1 to 2 days he has had difficulty making transfers because his legs feeling weak.  He was not able to walk very far because of generalized weakness and fatigue.  The patient had a similar hospital admission from 07/15/2022 to 07/17/2022.  At at that discharge in January, the patient's torsemide was decreased to 50 mg daily.  At the time of that discharge, his weight was 273 pounds he followed up with his nephrologist, Dr. Wolfgang Phoenix, on 08/09/22.  At that time, the patient's torsemide was changed to 100 mg on alternating days with 50 mg dosing.Marland Kitchen  He subsequently followed up in the office again with nephrology on 10/06/2022.  At that time, the patient was instructed to increase his torsemide to 100 mg every day and as his office weight had increased from 273 pounds to 288 pounds. At some point thereafter, the patient stated that he was instructed to restart his metolazone.  Patient states that because he was continuing to feel weak, he stopped taking his metolazone about 4 days prior to this admission. Patient denies fevers, chills, headache, chest pain, dyspnea, nausea, vomiting, diarrhea, abdominal pain, dysuria, hematuria, hematochezia, and melena. The patient denies any NSAIDs.  He denies any other new medicines. In the ED, the patient was  afebrile and hemodynamically stable albeit with soft blood pressures.  Oxygen saturation was 98% room air.  WBC 11.3, hemoglobin 12.0, platelets 179,000.  Sodium 122, potassium 2.1, bicarbonate 30, serum creatinine 3.08.  The patient was started on IV fluids and admitted for further evaluation and treatment.  Assessment and Plan:  Acute on chronic renal failure--CKD3b -Baseline creatinine 1.7-2.0 -presented with serum creatinine 3.08 -Secondary to hemodynamic changes in the setting of torsemide and metolazone -Holding metolazone and torsemide temporarily -Judicious IV fluids -instructed pt to stop metolazone -02/28/2023 standing weight--271.3 pounds -Will ultimately need close follow-up with Dr. Wolfgang Phoenix -serum creatinine 1.78 at discharge -Restart torsemide at 50 mg daily (previously taking 100 mg daily)   Hypokalemia -Repleted -Check magnesium--2.3 -Restart home dosing of potassium every 48 hours   Essential hypertension -Previously on valsartan which was discontinued on his visit on 07/13/2022 -Blood pressure has been soft   Opioid dependence -PDMP reviewed-- -patient receives hydrocodone 5/325, #60 monthly, last refill 02/20/2023 -Continue gabapentin at lower dose   Hyperlipidemia -Continue simvastatin   Hypothyroidism -Continue Synthroid   Hyponatremia -Secondary to acute kidney injury and metolazone -Monitor serial BMP -Presented with serum creatinine 122 -Serum creatinine up to 127 on day of discharge   Class II obesity -BMI 37.21 -Lifestyle modification   History of alcohol dependence--in remission -quit nearly 4 years ago -drank approx ten x 20 oz beers daily for many years     Consultants: none Procedures performed: none  Disposition: Home Diet recommendation:  Cardiac diet DISCHARGE MEDICATION: Allergies as of 03/01/2023       Reactions  Bee Pollen Other (See Comments)   Seasonal allergies   Codeine    Abdominal pain   Penicillins Rash    Childhood reaction        Medication List     STOP taking these medications    metolazone 5 MG tablet Commonly known as: ZAROXOLYN   spironolactone 25 MG tablet Commonly known as: ALDACTONE       TAKE these medications    allopurinol 300 MG tablet Commonly known as: ZYLOPRIM TAKE 1 TABLET BY MOUTH DAILY   calcitRIOL 0.25 MCG capsule Commonly known as: ROCALTROL Take 0.25 mcg by mouth 3 (three) times a week.   cholecalciferol 25 MCG (1000 UNIT) tablet Commonly known as: VITAMIN D3 Take 1 tablet (1,000 Units total) by mouth daily.   gabapentin 600 MG tablet Commonly known as: NEURONTIN TAKE 1 TABLET BY MOUTH THREE TIMES DAILY   HYDROcodone-acetaminophen 5-325 MG tablet Commonly known as: Norco Take 1 tablet by mouth every 12 (twelve) hours as needed for moderate pain. Start taking on: March 03, 2023 What changed: Another medication with the same name was removed. Continue taking this medication, and follow the directions you see here.   levothyroxine 112 MCG tablet Commonly known as: SYNTHROID TAKE 1 TABLET BY MOUTH DAILY   loratadine 10 MG tablet Commonly known as: CLARITIN TAKE 1 TABLET BY MOUTH EVERY DAY   mometasone 50 MCG/ACT nasal spray Commonly known as: NASONEX instill 2 SPRAYS into each nostril DAILY   montelukast 10 MG tablet Commonly known as: SINGULAIR TAKE 1 TABLET BY MOUTH AT BEDTIME What changed: when to take this   OMEGA-3 FISH OIL PO Take 1 capsule by mouth daily.   potassium chloride SA 20 MEQ tablet Commonly known as: KLOR-CON M TAKE 1 TABLET BY MOUTH three times WEEKLY ON monday, wednesday, AND friday, DO not crush OR chew   simvastatin 20 MG tablet Commonly known as: ZOCOR TAKE 1 TABLET BY MOUTH DAILY   torsemide 100 MG tablet Commonly known as: DEMADEX Take 0.5 tablets (50 mg total) by mouth daily. Start on 07/19/22   vitamin B-12 100 MCG tablet Commonly known as: CYANOCOBALAMIN Take 100 mcg by mouth daily.    vitamin E 180 MG (400 UNITS) capsule Take 400 Units by mouth daily.        Discharge Exam: Filed Weights   02/26/23 1926 02/27/23 1150 02/28/23 0800  Weight: 121.6 kg 121 kg 123.1 kg   HEENT:  [place]/AT, No thrush, no icterus CV:  RRR, no rub, no S3, no S4 Lung:  CTA, no wheeze, no rhonchi Abd:  soft/+BS, NT Ext:  1+ LE edema, no lymphangitis, no synovitis, no rash   Condition at discharge: stable  The results of significant diagnostics from this hospitalization (including imaging, microbiology, ancillary and laboratory) are listed below for reference.   Imaging Studies: No results found.  Microbiology: Results for orders placed or performed during the hospital encounter of 02/26/23  SARS Coronavirus 2 by RT PCR (hospital order, performed in [facility] hospital lab) *cepheid single result test* Anterior Nasal Swab     Status: None   Collection Time: 02/26/23  8:05 PM   Specimen: Anterior Nasal Swab  Result Value Ref Range Status   SARS Coronavirus 2 by RT PCR NEGATIVE NEGATIVE Final    Comment: (NOTE) SARS-CoV-2 target nucleic acids are NOT DETECTED.  The SARS-CoV-2 RNA is generally detectable in upper and lower respiratory specimens during the acute phase of infection. The lowest concentration of SARS-CoV-2 viral copies  this assay can detect is 250 copies / mL. A negative result does not preclude SARS-CoV-2 infection and should not be used as the sole basis for treatment or other patient management decisions.  A negative result may occur with improper specimen collection / handling, submission of specimen other than nasopharyngeal swab, presence of viral mutation(s) within the areas targeted by this assay, and inadequate number of viral copies (<250 copies / mL). A negative result must be combined with clinical observations, patient history, and epidemiological information.  Fact Sheet for Patients:   RoadLapTop.co.za  Fact Sheet for  Healthcare Providers: http://kim-miller.com/  This test is not yet approved or  cleared by the [place] FDA and has been authorized for detection and/or diagnosis of SARS-CoV-2 by FDA under an Emergency Use Authorization (EUA).  This EUA will remain in effect (meaning this test can be used) for the duration of the COVID-19 declaration under Section 564(b)(1) of the Act, 21 U.S.C. section 360bbb-3(b)(1), unless the authorization is terminated or revoked sooner.  Performed at [facility], [address] [place], [place] [address]   MRSA Next Gen by PCR, Nasal     Status: None   Collection Time: 02/26/23 11:06 PM   Specimen: Nasal Mucosa; Nasal Swab  Result Value Ref Range Status   MRSA by PCR Next Gen NOT DETECTED NOT DETECTED Final    Comment: (NOTE) The GeneXpert MRSA Assay (FDA approved for NASAL specimens only), is one component of a comprehensive MRSA colonization surveillance program. It is not intended to diagnose MRSA infection nor to guide or monitor treatment for MRSA infections. Test performance is not FDA approved in patients less than 45 years old. Performed at [facility], [address], [place], [place] [address]     Labs: CBC: Recent Labs  Lab 02/26/23 2000 02/27/23 0415 02/28/23 0409  WBC 13.8* 11.3* 12.1*  NEUTROABS 10.8*  --   --   HGB 13.0 12.0* 12.0*  HCT 37.1* 35.2* 35.8*  MCV 88.3 90.0 91.8  PLT 211 179 192   Basic Metabolic Panel: Recent Labs  Lab 02/27/23 0415 02/27/23 0758 02/27/23 1218 02/28/23 0409 03/01/23 0443  NA 123* 123* 123* 126* 127*  K 2.4* 2.5* 2.7* 3.4* 3.8  CL 83* 81* 84* 87* 94*  CO2 30 29 29 27 23   GLUCOSE 116* 115* 88 95 94  BUN 76* 77* 70* 65* 51*  CREATININE 2.80* 2.61* 2.39* 2.18* 1.78*  CALCIUM 8.2* 8.4* 8.6* 8.5* 8.2*  MG 2.5*  --   --  2.3  --    Liver Function Tests: Recent Labs  Lab 02/26/23 2000 02/28/23 0409  AST 49* 44*  ALT 64* 56*  ALKPHOS 71 61  BILITOT 1.1 1.1  PROT  7.6 6.8  ALBUMIN 3.2* 2.9*   CBG: Recent Labs  Lab 02/27/23 0716  GLUCAP 102*    Discharge time spent: greater than 30 minutes.  Signed: Catarina Hartshorn, MD [facility] 03/01/2023

## 2023-03-01 NOTE — Progress Notes (Signed)
Mobility Specialist Progress Note:    03/01/23 0930  Mobility  Activity Ambulated with assistance in hallway  Level of Assistance Contact guard assist, steadying assist  Assistive Device Front wheel walker  Distance Ambulated (ft) 75 ft  Range of Motion/Exercises Active;All extremities  Activity Response Tolerated well  Mobility Referral Yes  $Mobility charge 1 Mobility  Mobility Specialist Start Time (ACUTE ONLY) 0930  Mobility Specialist Stop Time (ACUTE ONLY) 0940  Mobility Specialist Time Calculation (min) (ACUTE ONLY) 10 min   Pt received in bed, agreeable to mobility. Required CGA to stand and ambulate with RW. Tolerated well, asx throughout. Returned pt to bed, all needs met   Lawerance Bach Mobility Specialist Please contact via SecureChat or  Rehab office at [phone]

## 2023-03-01 NOTE — Care Management Important Message (Signed)
Important Message  Patient Details  Name: Andrew Phelps MRN: [id number] Date of Birth: 01-Feb-1946   Medicare Important Message Given:  N/A - LOS <3 / Initial given by admissions     Corey Harold 03/01/2023, 12:09 PM

## 2023-03-03 ENCOUNTER — Telehealth: Payer: Self-pay

## 2023-03-03 NOTE — Transitions of Care (Post Inpatient/ED Visit) (Signed)
   03/03/2023  Name: Andrew Phelps MRN: [id number] DOB: March 12, 1946  Today's TOC FU Call Status: Today's TOC FU Call Status:: Unsuccessful Call (1st Attempt) Unsuccessful Call (1st Attempt) Date: 03/03/23  Attempted to reach the patient regarding the most recent Inpatient/ED visit.  Follow Up Plan: Additional outreach attempts will be made to reach the patient to complete the Transitions of Care (Post Inpatient/ED visit) call.   Jodelle Gross RN, BSN, CCM [facility] RN Care Coordinator/ Transitions of Care Direct Dial: [phone]  Fax: [phone]

## 2023-03-04 ENCOUNTER — Telehealth: Payer: Self-pay

## 2023-03-04 NOTE — Transitions of Care (Post Inpatient/ED Visit) (Signed)
03/04/2023  Name: Andrew Phelps MRN: [id number] DOB: Jan 21, 1946  Today's TOC FU Call Status: Today's TOC FU Call Status:: Unsuccessful Call (2nd Attempt) Unsuccessful Call (2nd Attempt) Date: 03/04/23  Attempted to reach the patient regarding the most recent Inpatient/ED visit.  Follow Up Plan: Additional outreach attempts will be made to reach the patient to complete the Transitions of Care (Post Inpatient/ED visit) call.   Jodelle Gross RN, BSN, CCM [facility] RN Care Coordinator/ Transitions of Care Direct Dial: [phone]  Fax: [phone]

## 2023-03-06 ENCOUNTER — Other Ambulatory Visit: Payer: Self-pay | Admitting: Family

## 2023-03-06 DIAGNOSIS — G6289 Other specified polyneuropathies: Secondary | ICD-10-CM

## 2023-03-07 ENCOUNTER — Telehealth: Payer: Self-pay

## 2023-03-07 NOTE — Transitions of Care (Post Inpatient/ED Visit) (Signed)
03/07/2023  Name: Jamarious Shon MRN: [id number] DOB: [birth year]  Today's TOC FU Call Status: Today's TOC FU Call Status:: Unsuccessful Call (3rd Attempt) Unsuccessful Call (3rd Attempt) Date: 03/07/23  Attempted to reach the patient regarding the most recent Inpatient/ED visit.  Follow Up Plan: No further outreach attempts will be made at this time. We have been unable to contact the patient.  Jodelle Gross RN, BSN, CCM [facility] RN Care Coordinator/ Transitions of Care Direct Dial: [phone]  Fax: [phone]

## 2023-03-29 DIAGNOSIS — N2581 Secondary hyperparathyroidism of renal origin: Secondary | ICD-10-CM | POA: Diagnosis not present

## 2023-03-29 DIAGNOSIS — N1832 Chronic kidney disease, stage 3b: Secondary | ICD-10-CM | POA: Diagnosis not present

## 2023-03-29 DIAGNOSIS — I129 Hypertensive chronic kidney disease with stage 1 through stage 4 chronic kidney disease, or unspecified chronic kidney disease: Secondary | ICD-10-CM | POA: Diagnosis not present

## 2023-04-04 DIAGNOSIS — I129 Hypertensive chronic kidney disease with stage 1 through stage 4 chronic kidney disease, or unspecified chronic kidney disease: Secondary | ICD-10-CM | POA: Diagnosis not present

## 2023-04-04 DIAGNOSIS — R600 Localized edema: Secondary | ICD-10-CM | POA: Diagnosis not present

## 2023-04-04 DIAGNOSIS — N2581 Secondary hyperparathyroidism of renal origin: Secondary | ICD-10-CM | POA: Diagnosis not present

## 2023-04-04 DIAGNOSIS — N1832 Chronic kidney disease, stage 3b: Secondary | ICD-10-CM | POA: Diagnosis not present

## 2023-04-12 ENCOUNTER — Ambulatory Visit: Payer: 59 | Admitting: Family

## 2023-04-12 ENCOUNTER — Encounter: Payer: Self-pay | Admitting: Family

## 2023-04-12 VITALS — BP 123/80 | HR 81 | Temp 97.6°F | Ht 71.0 in | Wt 275.0 lb

## 2023-04-12 DIAGNOSIS — M545 Low back pain, unspecified: Secondary | ICD-10-CM

## 2023-04-12 DIAGNOSIS — Z09 Encounter for follow-up examination after completed treatment for conditions other than malignant neoplasm: Secondary | ICD-10-CM

## 2023-04-12 DIAGNOSIS — N1832 Chronic kidney disease, stage 3b: Secondary | ICD-10-CM | POA: Diagnosis not present

## 2023-04-12 DIAGNOSIS — G6289 Other specified polyneuropathies: Secondary | ICD-10-CM

## 2023-04-12 DIAGNOSIS — F1120 Opioid dependence, uncomplicated: Secondary | ICD-10-CM | POA: Diagnosis not present

## 2023-04-12 DIAGNOSIS — E876 Hypokalemia: Secondary | ICD-10-CM | POA: Diagnosis not present

## 2023-04-12 DIAGNOSIS — E039 Hypothyroidism, unspecified: Secondary | ICD-10-CM | POA: Diagnosis not present

## 2023-04-12 DIAGNOSIS — N179 Acute kidney failure, unspecified: Secondary | ICD-10-CM

## 2023-04-12 DIAGNOSIS — I10 Essential (primary) hypertension: Secondary | ICD-10-CM

## 2023-04-12 DIAGNOSIS — G4700 Insomnia, unspecified: Secondary | ICD-10-CM

## 2023-04-12 DIAGNOSIS — M1711 Unilateral primary osteoarthritis, right knee: Secondary | ICD-10-CM

## 2023-04-12 DIAGNOSIS — D649 Anemia, unspecified: Secondary | ICD-10-CM

## 2023-04-12 DIAGNOSIS — M1990 Unspecified osteoarthritis, unspecified site: Secondary | ICD-10-CM | POA: Diagnosis not present

## 2023-04-12 DIAGNOSIS — E785 Hyperlipidemia, unspecified: Secondary | ICD-10-CM | POA: Diagnosis not present

## 2023-04-12 DIAGNOSIS — Z0289 Encounter for other administrative examinations: Secondary | ICD-10-CM

## 2023-04-12 MED ORDER — HYDROCODONE-ACETAMINOPHEN 5-325 MG PO TABS
1.0000 | ORAL_TABLET | Freq: Two times a day (BID) | ORAL | 0 refills | Status: DC | PRN
Start: 2023-04-12 — End: 2023-07-14

## 2023-04-12 MED ORDER — HYDROCODONE-ACETAMINOPHEN 5-325 MG PO TABS: 1.0000 | ORAL_TABLET | Freq: Two times a day (BID) | ORAL | 0 refills | Status: DC | PRN

## 2023-04-12 MED ORDER — HYDROCODONE-ACETAMINOPHEN 5-325 MG PO TABS
1.0000 | ORAL_TABLET | Freq: Two times a day (BID) | ORAL | 0 refills | Status: DC | PRN
Start: 2023-06-08 — End: 2023-07-14

## 2023-04-12 NOTE — Progress Notes (Signed)
Subjective:    Patient ID: Andrew Phelps, male    DOB: [birth year], 77 y.o.   MRN: [id number]  Chief Complaint  Patient presents with   Medical Management of Chronic Issues   PT presents the office today for chronic follow up and pain medication refill. PT is followed by Nephrologists every 2 months for CKD.  He has peripheral edema. He is currently taking demadex 100 mg one day. Continues to have peripheral edema and mild SOB.    He was hospitalize 02/26/23 to 03/01/23 for hypokalemia and hypocalcemia .  He is considered morbid obese with a BMI of 38 and co morbidity of HTN and CKD.  Hypertension This is a chronic problem. The current episode started more than 1 year ago. The problem has been resolved since onset. The problem is controlled. Associated symptoms include malaise/fatigue, peripheral edema and shortness of breath. Risk factors for coronary artery disease include dyslipidemia, obesity, male gender and sedentary lifestyle. The current treatment provides moderate improvement. Identifiable causes of hypertension include a thyroid problem.  Thyroid Problem Presents for follow-up visit. Symptoms include fatigue and hoarse voice. The symptoms have been stable. His past medical history is significant for hyperlipidemia.  Arthritis Presents for follow-up visit. He complains of pain and stiffness. Affected locations include the left knee, right knee, left MCP and right MCP. His pain is at a severity of 8/10. Associated symptoms include fatigue.  Insomnia Primary symptoms: sleep disturbance, difficulty falling asleep, malaise/fatigue.   The current episode started more than one year. The onset quality is gradual. The problem occurs intermittently. Past treatments include medication. The treatment provided moderate relief.  Hyperlipidemia This is a chronic problem. The current episode started more than 1 year ago. Exacerbating diseases include obesity. Associated symptoms include  shortness of breath. Current antihyperlipidemic treatment includes statins. The current treatment provides mild improvement of lipids. Risk factors for coronary artery disease include dyslipidemia, diabetes mellitus, hypertension, male sex and a sedentary lifestyle.  Back Pain This is a chronic problem. The current episode started more than 1 year ago. The problem occurs intermittently. The problem has been waxing and waning since onset. The pain is present in the lumbar spine. The quality of the pain is described as aching. The pain is at a severity of 8/10. The pain is moderate. He has tried analgesics for the symptoms. The treatment provided moderate relief.  Anemia Presents for follow-up visit. Symptoms include malaise/fatigue.    Current opioids rx- Norco 5-325 mg # meds rx- 60 Effectiveness of current meds-stable Adverse reactions from pain meds-none Morphine equivalent- 10   Pill count performed-No Last drug screen - today ( high risk q62m, moderate risk q84m, low risk yearly ) Urine drug screen today- Yes Was the NCCSR reviewed- yes             If yes were their any concerning findings? - none   Pain contract signed on: 10/13/22  Review of Systems  Constitutional:  Positive for fatigue and malaise/fatigue.  HENT:  Positive for hoarse voice.   Respiratory:  Positive for shortness of breath.   Musculoskeletal:  Positive for arthritis, back pain and stiffness.  Psychiatric/Behavioral:  Positive for sleep disturbance. The patient has insomnia.   All other systems reviewed and are negative.      Objective:   Physical Exam Vitals reviewed.  Constitutional:      General: He is not in acute distress.    Appearance: He is well-developed. He is obese.  HENT:  Head: Normocephalic.     Right Ear: Tympanic membrane normal.     Left Ear: Tympanic membrane normal.  Eyes:     General:        Right eye: No discharge.        Left eye: No discharge.     Pupils: Pupils are equal,  round, and reactive to light.  Neck:     Thyroid: No thyromegaly.  Cardiovascular:     Rate and Rhythm: Normal rate and regular rhythm.     Heart sounds: Normal heart sounds. No murmur heard. Pulmonary:     Effort: Pulmonary effort is normal. No respiratory distress.     Breath sounds: Normal breath sounds. No wheezing.  Abdominal:     General: Bowel sounds are normal. There is no distension.     Palpations: Abdomen is soft.     Tenderness: There is no abdominal tenderness.  Musculoskeletal:        General: No tenderness. Normal range of motion.     Cervical back: Normal range of motion and neck supple.     Right lower leg: Edema (3+) present.     Left lower leg: Edema (3+) present.  Skin:    General: Skin is warm and dry.     Findings: No erythema or rash.  Neurological:     Mental Status: He is alert and oriented to person, place, and time.     Cranial Nerves: No cranial nerve deficit.     Deep Tendon Reflexes: Reflexes are normal and symmetric.  Psychiatric:        Behavior: Behavior normal.        Thought Content: Thought content normal.        Judgment: Judgment normal.       BP 123/80   Pulse 81   Temp 97.6 F (36.4 C) (Temporal)   Ht 5\' 11"  (1.803 m)   Wt 275 lb (124.7 kg)   SpO2 91%   BMI 38.35 kg/m      Assessment & Plan:   Andrew Phelps comes in today with chief complaint of Medical Management of Chronic Issues   Diagnosis and orders addressed:  1. Arthritis - HYDROcodone-acetaminophen (NORCO) 5-325 MG tablet; Take 1 tablet by mouth every 12 (twelve) hours as needed for moderate pain (pain score 4-6).  Dispense: 60 tablet; Refill: 0 - CMP14+EGFR - CBC with Differential/Platelet - HYDROcodone-acetaminophen (NORCO) 5-325 MG tablet; Take 1 tablet by mouth every 12 (twelve) hours as needed for moderate pain (pain score 4-6).  Dispense: 60 tablet; Refill: 0 - HYDROcodone-acetaminophen (NORCO) 5-325 MG tablet; Take 1 tablet by mouth every 12  (twelve) hours as needed for moderate pain (pain score 4-6).  Dispense: 60 tablet; Refill: 0  2. Pain medication agreement signed - HYDROcodone-acetaminophen (NORCO) 5-325 MG tablet; Take 1 tablet by mouth every 12 (twelve) hours as needed for moderate pain (pain score 4-6).  Dispense: 60 tablet; Refill: 0 - CMP14+EGFR - CBC with Differential/Platelet - HYDROcodone-acetaminophen (NORCO) 5-325 MG tablet; Take 1 tablet by mouth every 12 (twelve) hours as needed for moderate pain (pain score 4-6).  Dispense: 60 tablet; Refill: 0 - HYDROcodone-acetaminophen (NORCO) 5-325 MG tablet; Take 1 tablet by mouth every 12 (twelve) hours as needed for moderate pain (pain score 4-6).  Dispense: 60 tablet; Refill: 0  3. Uncomplicated opioid dependence (HCC) - HYDROcodone-acetaminophen (NORCO) 5-325 MG tablet; Take 1 tablet by mouth every 12 (twelve) hours as needed for moderate pain (pain score 4-6).  Dispense: 60  tablet; Refill: 0 - CMP14+EGFR - CBC with Differential/Platelet - HYDROcodone-acetaminophen (NORCO) 5-325 MG tablet; Take 1 tablet by mouth every 12 (twelve) hours as needed for moderate pain (pain score 4-6).  Dispense: 60 tablet; Refill: 0 - HYDROcodone-acetaminophen (NORCO) 5-325 MG tablet; Take 1 tablet by mouth every 12 (twelve) hours as needed for moderate pain (pain score 4-6).  Dispense: 60 tablet; Refill: 0  4. Chronic bilateral low back pain without sciatica - HYDROcodone-acetaminophen (NORCO) 5-325 MG tablet; Take 1 tablet by mouth every 12 (twelve) hours as needed for moderate pain (pain score 4-6).  Dispense: 60 tablet; Refill: 0 - CMP14+EGFR - CBC with Differential/Platelet - HYDROcodone-acetaminophen (NORCO) 5-325 MG tablet; Take 1 tablet by mouth every 12 (twelve) hours as needed for moderate pain (pain score 4-6).  Dispense: 60 tablet; Refill: 0 - HYDROcodone-acetaminophen (NORCO) 5-325 MG tablet; Take 1 tablet by mouth every 12 (twelve) hours as needed for moderate pain (pain score  4-6).  Dispense: 60 tablet; Refill: 0  5. Unilateral primary osteoarthritis, right knee - CMP14+EGFR - CBC with Differential/Platelet  6. Other polyneuropathy - CMP14+EGFR - CBC with Differential/Platelet  7. Morbid obesity (HCC) - CMP14+EGFR - CBC with Differential/Platelet  8. Insomnia, unspecified type  - CMP14+EGFR - CBC with Differential/Platelet  9. Hypothyroidism, unspecified typ - CMP14+EGFR - CBC with Differential/Platelet  10. Hypokalemia  - CMP14+EGFR - CBC with Differential/Platelet  11. Hyperlipidemia, unspecified hyperlipidemia type - CMP14+EGFR - CBC with Differential/Platelet  12. Essential hypertension - CMP14+EGFR - CBC with Differential/Platelet  13. Stage 3b chronic kidney disease (HCC) - CMP14+EGFR - CBC with Differential/Platelet  14. Anemia, unspecified type - CMP14+EGFR - CBC with Differential/Platelet  15. Acute renal failure superimposed on stage 3b chronic kidney disease, unspecified acute renal failure type (HCC) - CMP14+EGFR - CBC with Differential/Platelet  16. Hospital discharge follow-up - Magnesium   Labs pending Patient reviewed in [place] controlled database, no flags noted. Contract and drug screen are up to date.  Continue current medications  Health Maintenance reviewed Diet and exercise encouraged  Follow up plan: 3 months    Jannifer Rodney, FNP

## 2023-04-12 NOTE — Patient Instructions (Signed)
Peripheral Edema  Peripheral edema is swelling that is caused by a buildup of fluid. Peripheral edema most often affects the lower legs, ankles, and feet. It can also develop in the arms, hands, and face. The area of the body that has peripheral edema will look swollen. It may also feel heavy or warm. Your clothes may start to feel tight. Pressing on the area may make a temporary dent in your skin (pitting edema). You may not be able to move your swollen arm or leg as much as usual. There are many causes of peripheral edema. It can happen because of a complication of other conditions such as heart failure, kidney disease, or a problem with your circulation. It also can be a side effect of certain medicines or happen because of an infection. It often happens to women during pregnancy. Sometimes, the cause is not known. Follow these instructions at home: Managing pain, stiffness, and swelling  Raise (elevate) your legs while you are sitting or lying down. Move around often to prevent stiffness and to reduce swelling. Do not sit or stand for long periods of time. Do not wear tight clothing. Do not wear garters on your upper legs. Exercise your legs to get your circulation going. This helps to move the fluid back into your blood vessels, and it may help the swelling go down. Wear compression stockings as told by your health care provider. These stockings help to prevent blood clots and reduce swelling in your legs. It is important that these are the correct size. These stockings should be prescribed by your doctor to prevent possible injuries. If elastic bandages or wraps are recommended, use them as told by your health care provider. Medicines Take over-the-counter and prescription medicines only as told by your health care provider. Your health care provider may prescribe medicine to help your body get rid of excess water (diuretic). Take this medicine if you are told to take it. General  instructions Eat a low-salt (low-sodium) diet as told by your health care provider. Sometimes, eating less salt may reduce swelling. Pay attention to any changes in your symptoms. Moisturize your skin daily to help prevent skin from cracking and draining. Keep all follow-up visits. This is important. Contact a health care provider if: You have a fever. You have swelling in only one leg. You have increased swelling, redness, or pain in one or both of your legs. You have drainage or sores at the area where you have edema. Get help right away if: You have edema that starts suddenly or is getting worse, especially if you are pregnant or have a medical condition. You develop shortness of breath, especially when you are lying down. You have pain in your chest or abdomen. You feel weak. You feel like you will faint. These symptoms may be an emergency. Get help right away. Call 911. Do not wait to see if the symptoms will go away. Do not drive yourself to the hospital. Summary Peripheral edema is swelling that is caused by a buildup of fluid. Peripheral edema most often affects the lower legs, ankles, and feet. Move around often to prevent stiffness and to reduce swelling. Do not sit or stand for long periods of time. Pay attention to any changes in your symptoms. Contact a health care provider if you have edema that starts suddenly or is getting worse, especially if you are pregnant or have a medical condition. Get help right away if you develop shortness of breath, especially when lying down.   This information is not intended to replace advice given to you by your health care provider. Make sure you discuss any questions you have with your health care provider. Document Revised: 02/09/2021 Document Reviewed: 02/09/2021 Elsevier Patient Education  2024 [employer] Inc[employer]  

## 2023-04-13 ENCOUNTER — Other Ambulatory Visit: Payer: Self-pay | Admitting: *Deleted

## 2023-04-13 LAB — CBC WITH DIFFERENTIAL/PLATELET
Basophils Absolute: 0.1 10*3/uL (ref 0.0–0.2)
Basos: 1 %
EOS (ABSOLUTE): 0.4 10*3/uL (ref 0.0–0.4)
Eos: 4 %
Hematocrit: 42.3 % (ref 37.5–51.0)
Hemoglobin: 13.5 g/dL (ref 13.0–17.7)
Immature Grans (Abs): 0.1 10*3/uL (ref 0.0–0.1)
Immature Granulocytes: 1 %
Lymphocytes Absolute: 2.2 10*3/uL (ref 0.7–3.1)
Lymphs: 19 %
MCH: 30.8 pg (ref 26.6–33.0)
MCHC: 31.9 g/dL (ref 31.5–35.7)
MCV: 96 fL (ref 79–97)
Monocytes Absolute: 0.6 10*3/uL (ref 0.1–0.9)
Monocytes: 5 %
Neutrophils Absolute: 8.5 10*3/uL — ABNORMAL HIGH (ref 1.4–7.0)
Neutrophils: 70 %
Platelets: 215 10*3/uL (ref 150–450)
RBC: 4.39 x10E6/uL (ref 4.14–5.80)
RDW: 15.1 % (ref 11.6–15.4)
WBC: 11.9 10*3/uL — ABNORMAL HIGH (ref 3.4–10.8)

## 2023-04-13 LAB — CMP14+EGFR
ALT: 16 [IU]/L (ref 0–44)
AST: 19 [IU]/L (ref 0–40)
Albumin: 4 g/dL (ref 3.8–4.8)
Alkaline Phosphatase: 107 IU/L (ref 44–121)
BUN/Creatinine Ratio: 7 — ABNORMAL LOW (ref 10–24)
BUN: 15 mg/dL (ref 8–27)
Bilirubin Total: 1 mg/dL (ref 0.0–1.2)
CO2: 26 mmol/L (ref 20–29)
Calcium: 8.9 mg/dL (ref 8.6–10.2)
Chloride: 95 mmol/L — ABNORMAL LOW (ref 96–106)
Creatinine, Ser: 2.09 mg/dL — ABNORMAL HIGH (ref 0.76–1.27)
Globulin, Total: 3.3 g/dL (ref 1.5–4.5)
Glucose: 100 mg/dL — ABNORMAL HIGH (ref 70–99)
Potassium: 3.7 mmol/L (ref 3.5–5.2)
Sodium: 139 mmol/L (ref 134–144)
Total Protein: 7.3 g/dL (ref 6.0–8.5)
eGFR: 32 mL/min/{1.73_m2} — ABNORMAL LOW (ref >59)

## 2023-04-13 LAB — MAGNESIUM: Magnesium: 2.3 mg/dL (ref 1.6–2.3)

## 2023-05-03 ENCOUNTER — Other Ambulatory Visit: Payer: Self-pay | Admitting: Family

## 2023-05-03 DIAGNOSIS — G6289 Other specified polyneuropathies: Secondary | ICD-10-CM

## 2023-05-16 ENCOUNTER — Other Ambulatory Visit: Payer: Self-pay | Admitting: Family

## 2023-06-24 DIAGNOSIS — N1832 Chronic kidney disease, stage 3b: Secondary | ICD-10-CM | POA: Diagnosis not present

## 2023-06-24 DIAGNOSIS — N2581 Secondary hyperparathyroidism of renal origin: Secondary | ICD-10-CM | POA: Diagnosis not present

## 2023-06-24 DIAGNOSIS — G4733 Obstructive sleep apnea (adult) (pediatric): Secondary | ICD-10-CM | POA: Diagnosis not present

## 2023-06-24 DIAGNOSIS — I129 Hypertensive chronic kidney disease with stage 1 through stage 4 chronic kidney disease, or unspecified chronic kidney disease: Secondary | ICD-10-CM | POA: Diagnosis not present

## 2023-06-24 DIAGNOSIS — R600 Localized edema: Secondary | ICD-10-CM | POA: Diagnosis not present

## 2023-07-01 DIAGNOSIS — I129 Hypertensive chronic kidney disease with stage 1 through stage 4 chronic kidney disease, or unspecified chronic kidney disease: Secondary | ICD-10-CM | POA: Diagnosis not present

## 2023-07-01 DIAGNOSIS — N1832 Chronic kidney disease, stage 3b: Secondary | ICD-10-CM | POA: Diagnosis not present

## 2023-07-01 DIAGNOSIS — N2581 Secondary hyperparathyroidism of renal origin: Secondary | ICD-10-CM | POA: Diagnosis not present

## 2023-07-01 DIAGNOSIS — G4733 Obstructive sleep apnea (adult) (pediatric): Secondary | ICD-10-CM | POA: Diagnosis not present

## 2023-07-03 ENCOUNTER — Other Ambulatory Visit: Payer: Self-pay | Admitting: Family

## 2023-07-03 DIAGNOSIS — E785 Hyperlipidemia, unspecified: Secondary | ICD-10-CM

## 2023-07-03 DIAGNOSIS — N1832 Chronic kidney disease, stage 3b: Secondary | ICD-10-CM

## 2023-07-03 DIAGNOSIS — G6289 Other specified polyneuropathies: Secondary | ICD-10-CM

## 2023-07-14 ENCOUNTER — Ambulatory Visit: Payer: 59 | Admitting: Family

## 2023-07-14 ENCOUNTER — Encounter: Payer: Self-pay | Admitting: Family

## 2023-07-14 VITALS — BP 138/77 | HR 81 | Temp 96.4°F | Ht 71.0 in | Wt 283.0 lb

## 2023-07-14 DIAGNOSIS — R600 Localized edema: Secondary | ICD-10-CM | POA: Diagnosis not present

## 2023-07-14 DIAGNOSIS — G8929 Other chronic pain: Secondary | ICD-10-CM

## 2023-07-14 DIAGNOSIS — I10 Essential (primary) hypertension: Secondary | ICD-10-CM

## 2023-07-14 DIAGNOSIS — G6289 Other specified polyneuropathies: Secondary | ICD-10-CM

## 2023-07-14 DIAGNOSIS — M1990 Unspecified osteoarthritis, unspecified site: Secondary | ICD-10-CM

## 2023-07-14 DIAGNOSIS — E039 Hypothyroidism, unspecified: Secondary | ICD-10-CM

## 2023-07-14 DIAGNOSIS — L03119 Cellulitis of unspecified part of limb: Secondary | ICD-10-CM | POA: Diagnosis not present

## 2023-07-14 DIAGNOSIS — J45909 Unspecified asthma, uncomplicated: Secondary | ICD-10-CM

## 2023-07-14 DIAGNOSIS — N1832 Chronic kidney disease, stage 3b: Secondary | ICD-10-CM | POA: Diagnosis not present

## 2023-07-14 DIAGNOSIS — E785 Hyperlipidemia, unspecified: Secondary | ICD-10-CM

## 2023-07-14 DIAGNOSIS — Z0289 Encounter for other administrative examinations: Secondary | ICD-10-CM | POA: Diagnosis not present

## 2023-07-14 DIAGNOSIS — F1120 Opioid dependence, uncomplicated: Secondary | ICD-10-CM

## 2023-07-14 DIAGNOSIS — G4700 Insomnia, unspecified: Secondary | ICD-10-CM | POA: Diagnosis not present

## 2023-07-14 DIAGNOSIS — M545 Low back pain, unspecified: Secondary | ICD-10-CM | POA: Diagnosis not present

## 2023-07-14 MED ORDER — HYDROCODONE-ACETAMINOPHEN 5-325 MG PO TABS: 1.0000 | ORAL_TABLET | Freq: Two times a day (BID) | ORAL | 0 refills | Status: DC | PRN

## 2023-07-14 MED ORDER — HYDROCODONE-ACETAMINOPHEN 5-325 MG PO TABS
1.0000 | ORAL_TABLET | Freq: Two times a day (BID) | ORAL | 0 refills | Status: DC | PRN
Start: 2023-08-11 — End: 2023-10-12

## 2023-07-14 MED ORDER — DOXYCYCLINE HYCLATE 100 MG PO TABS
100.0000 mg | ORAL_TABLET | Freq: Two times a day (BID) | ORAL | 0 refills | Status: DC
Start: 2023-07-14 — End: 2023-10-13

## 2023-07-14 NOTE — Patient Instructions (Signed)

## 2023-07-14 NOTE — Progress Notes (Signed)
Subjective:    Patient ID: Andrew Phelps, male    DOB: 1945/10/29, 78 y.o.   MRN: [id number]  Chief Complaint  Patient presents with   Medical Management of Chronic Issues    Swelling in legs wants referral to heart doctor. Wants refill of abx dicloxacill 500mg  kidney doctor gave him for his legs    PT presents the office today for chronic follow up and pain medication refill. PT is followed by Nephrologists every 2 months for CKD.  He has peripheral edema. He is currently taking demadex 100 mg one day. Continues to have peripheral edema and mild SOB.   Reports he had cellulitis in bilateral legs and taking dicloxacillin 500 mg. Reports his legs are improving, but still slightly erythemas.    He was hospitalize 02/26/23 to 03/01/23 for hypokalemia and hypocalcemia.  He is considered morbid obese with a BMI of 39 and co morbidity of HTN and CKD.  Hypertension This is a chronic problem. The current episode started more than 1 year ago. The problem has been resolved since onset. The problem is controlled. Associated symptoms include peripheral edema and shortness of breath ("when I walk a log"). Pertinent negatives include no malaise/fatigue. Risk factors for coronary artery disease include dyslipidemia, obesity, male gender and sedentary lifestyle. The current treatment provides moderate improvement. Identifiable causes of hypertension include a thyroid problem.  Thyroid Problem Presents for follow-up visit. Symptoms include constipation, dry skin, fatigue and hoarse voice. Patient reports no diarrhea. The symptoms have been stable.  Arthritis Presents for follow-up visit. He complains of pain and stiffness. Affected locations include the left knee, right knee, left MCP and right MCP. His pain is at a severity of 9/10. Associated symptoms include fatigue. Pertinent negatives include no diarrhea.  Insomnia Primary symptoms: sleep disturbance, difficulty falling asleep, no  malaise/fatigue.   The current episode started more than one year. The onset quality is gradual. The problem occurs intermittently. Past treatments include medication. The treatment provided moderate relief.  Hyperlipidemia This is a chronic problem. The current episode started more than 1 year ago. Exacerbating diseases include obesity. Associated symptoms include shortness of breath ("when I walk a log"). Current antihyperlipidemic treatment includes statins. The current treatment provides mild improvement of lipids. Risk factors for coronary artery disease include dyslipidemia, diabetes mellitus, hypertension, male sex and a sedentary lifestyle.  Back Pain This is a chronic problem. The current episode started more than 1 year ago. The problem occurs intermittently. The problem has been waxing and waning since onset. The pain is present in the lumbar spine. The quality of the pain is described as aching. The pain is at a severity of 9/10. The pain is moderate. He has tried analgesics for the symptoms. The treatment provided moderate relief.  Anemia Presents for follow-up visit. There has been no malaise/fatigue.    Current opioids rx- Norco 5-325 mg # meds rx- 60 Effectiveness of current meds-stable Adverse reactions from pain meds-none Morphine equivalent- 10   Pill count performed-No Last drug screen - today ( high risk q6m, moderate risk q15m, low risk yearly ) Urine drug screen today- Yes Was the [facility] reviewed- yes             If yes were their any concerning findings? - none   Pain contract signed on: 10/13/22  Review of Systems  Constitutional:  Positive for fatigue. Negative for malaise/fatigue.  HENT:  Positive for hoarse voice.   Respiratory:  Positive for shortness of breath ("when  I walk a log").   Gastrointestinal:  Positive for constipation. Negative for diarrhea.  Musculoskeletal:  Positive for back pain and stiffness.  Psychiatric/Behavioral:  Positive for sleep  disturbance.   All other systems reviewed and are negative.      Objective:   Physical Exam Vitals reviewed.  Constitutional:      General: He is not in acute distress.    Appearance: He is well-developed. He is obese.  HENT:     Head: Normocephalic.     Right Ear: Tympanic membrane normal.     Left Ear: Tympanic membrane normal.  Eyes:     General:        Right eye: No discharge.        Left eye: No discharge.     Pupils: Pupils are equal, round, and reactive to light.  Neck:     Thyroid: No thyromegaly.  Cardiovascular:     Rate and Rhythm: Normal rate and regular rhythm.     Heart sounds: Normal heart sounds. No murmur heard. Pulmonary:     Effort: Pulmonary effort is normal. No respiratory distress.     Breath sounds: Normal breath sounds. No wheezing.  Abdominal:     General: Bowel sounds are normal. There is no distension.     Palpations: Abdomen is soft.     Tenderness: There is no abdominal tenderness.  Musculoskeletal:        General: No tenderness. Normal range of motion.     Cervical back: Normal range of motion and neck supple.     Right lower leg: Edema (3+) present.     Left lower leg: Edema (3+) present.     Comments: Bilateral legs erythemas  Skin:    General: Skin is warm and dry.     Findings: No erythema or rash.  Neurological:     Mental Status: He is alert and oriented to person, place, and time.     Cranial Nerves: No cranial nerve deficit.     Deep Tendon Reflexes: Reflexes are normal and symmetric.  Psychiatric:        Behavior: Behavior normal.        Thought Content: Thought content normal.        Judgment: Judgment normal.       BP 138/77   Pulse 81   Temp (!) 96.4 F (35.8 C) (Temporal)   Ht 5\' 11"  (1.803 m)   Wt 283 lb (128.4 kg)   SpO2 96%   BMI 39.47 kg/m      Assessment & Plan:   Andrew Phelps comes in today with chief complaint of Medical Management of Chronic Issues (Swelling in legs wants referral to heart  doctor. Wants refill of abx dicloxacill 500mg  kidney doctor gave him for his legs )   Diagnosis and orders addressed:  1. Arthritis - CBC with Differential/Platelet - CMP14+EGFR - HYDROcodone-acetaminophen (NORCO) 5-325 MG tablet; Take 1 tablet by mouth every 12 (twelve) hours as needed for moderate pain (pain score 4-6).  Dispense: 60 tablet; Refill: 0 - HYDROcodone-acetaminophen (NORCO) 5-325 MG tablet; Take 1 tablet by mouth every 12 (twelve) hours as needed for moderate pain (pain score 4-6).  Dispense: 60 tablet; Refill: 0 - HYDROcodone-acetaminophen (NORCO) 5-325 MG tablet; Take 1 tablet by mouth every 12 (twelve) hours as needed for moderate pain (pain score 4-6).  Dispense: 60 tablet; Refill: 0  2. Pain medication agreement signed - CBC with Differential/Platelet - CMP14+EGFR - HYDROcodone-acetaminophen (NORCO) 5-325 MG tablet; Take  1 tablet by mouth every 12 (twelve) hours as needed for moderate pain (pain score 4-6).  Dispense: 60 tablet; Refill: 0 - HYDROcodone-acetaminophen (NORCO) 5-325 MG tablet; Take 1 tablet by mouth every 12 (twelve) hours as needed for moderate pain (pain score 4-6).  Dispense: 60 tablet; Refill: 0 - HYDROcodone-acetaminophen (NORCO) 5-325 MG tablet; Take 1 tablet by mouth every 12 (twelve) hours as needed for moderate pain (pain score 4-6).  Dispense: 60 tablet; Refill: 0  3. Uncomplicated opioid dependence (HCC) - CBC with Differential/Platelet - CMP14+EGFR - HYDROcodone-acetaminophen (NORCO) 5-325 MG tablet; Take 1 tablet by mouth every 12 (twelve) hours as needed for moderate pain (pain score 4-6).  Dispense: 60 tablet; Refill: 0 - HYDROcodone-acetaminophen (NORCO) 5-325 MG tablet; Take 1 tablet by mouth every 12 (twelve) hours as needed for moderate pain (pain score 4-6).  Dispense: 60 tablet; Refill: 0 - HYDROcodone-acetaminophen (NORCO) 5-325 MG tablet; Take 1 tablet by mouth every 12 (twelve) hours as needed for moderate pain (pain score 4-6).   Dispense: 60 tablet; Refill: 0  4. Chronic bilateral low back pain without sciatica - CBC with Differential/Platelet - CMP14+EGFR - HYDROcodone-acetaminophen (NORCO) 5-325 MG tablet; Take 1 tablet by mouth every 12 (twelve) hours as needed for moderate pain (pain score 4-6).  Dispense: 60 tablet; Refill: 0 - HYDROcodone-acetaminophen (NORCO) 5-325 MG tablet; Take 1 tablet by mouth every 12 (twelve) hours as needed for moderate pain (pain score 4-6).  Dispense: 60 tablet; Refill: 0 - HYDROcodone-acetaminophen (NORCO) 5-325 MG tablet; Take 1 tablet by mouth every 12 (twelve) hours as needed for moderate pain (pain score 4-6).  Dispense: 60 tablet; Refill: 0  5. Essential hypertension (Primary) - CBC with Differential/Platelet - CMP14+EGFR - Ambulatory referral to Cardiology  6. Hypothyroidism, unspecified type - CBC with Differential/Platelet - CMP14+EGFR  7. Insomnia, unspecified type - CBC with Differential/Platelet - CMP14+EGFR  8. Hyperlipidemia, unspecified hyperlipidemia type - CBC with Differential/Platelet - CMP14+EGFR - Ambulatory referral to Cardiology  9. Chronic asthma without complication, unspecified asthma severity, unspecified whether persistent  - CBC with Differential/Platelet - CMP14+EGFR  10. Morbid obesity (HCC) - CBC with Differential/Platelet - CMP14+EGFR  11. Other polyneuropathy - CBC with Differential/Platelet - CMP14+EGFR  12. Cellulitis of lower extremity, unspecified laterality - CBC with Differential/Platelet - CMP14+EGFR - doxycycline (VIBRA-TABS) 100 MG tablet; Take 1 tablet (100 mg total) by mouth 2 (two) times daily.  Dispense: 20 tablet; Refill: 0  13. Peripheral edema - Ambulatory referral to Cardiology  14. Stage 3b chronic kidney disease (HCC)  Labs pending Patient reviewed in [place] controlled database, no flags noted. Contract and drug screen are up to date.  Start doxycyline for cellulitis.  Continue current medications  Health  Maintenance reviewed Diet and exercise encouraged  Follow up plan: 3 months    Jannifer Rodney, FNP

## 2023-07-15 ENCOUNTER — Encounter: Payer: Self-pay | Admitting: Cardiology

## 2023-07-15 ENCOUNTER — Ambulatory Visit: Payer: 59 | Attending: Cardiology | Admitting: Cardiology

## 2023-07-15 VITALS — BP 138/80 | HR 96 | Ht 71.0 in | Wt 283.8 lb

## 2023-07-15 DIAGNOSIS — R600 Localized edema: Secondary | ICD-10-CM | POA: Diagnosis not present

## 2023-07-15 DIAGNOSIS — R0602 Shortness of breath: Secondary | ICD-10-CM | POA: Diagnosis not present

## 2023-07-15 LAB — CMP14+EGFR
ALT: 14 [IU]/L (ref 0–44)
AST: 25 [IU]/L (ref 0–40)
Albumin: 4 g/dL (ref 3.8–4.8)
Alkaline Phosphatase: 114 [IU]/L (ref 44–121)
BUN/Creatinine Ratio: 7 — ABNORMAL LOW (ref 10–24)
BUN: 16 mg/dL (ref 8–27)
Bilirubin Total: 0.5 mg/dL (ref 0.0–1.2)
CO2: 22 mmol/L (ref 20–29)
Calcium: 9.1 mg/dL (ref 8.6–10.2)
Chloride: 93 mmol/L — ABNORMAL LOW (ref 96–106)
Creatinine, Ser: 2.2 mg/dL — ABNORMAL HIGH (ref 0.76–1.27)
Globulin, Total: 3.6 g/dL (ref 1.5–4.5)
Glucose: 105 mg/dL — ABNORMAL HIGH (ref 70–99)
Potassium: 4.1 mmol/L (ref 3.5–5.2)
Sodium: 136 mmol/L (ref 134–144)
Total Protein: 7.6 g/dL (ref 6.0–8.5)
eGFR: 30 mL/min/{1.73_m2} — ABNORMAL LOW (ref >59)

## 2023-07-15 LAB — CBC WITH DIFFERENTIAL/PLATELET
Basophils Absolute: 0.1 10*3/uL (ref 0.0–0.2)
Basos: 1 %
EOS (ABSOLUTE): 0.4 10*3/uL (ref 0.0–0.4)
Eos: 4 %
Hematocrit: 45.8 % (ref 37.5–51.0)
Hemoglobin: 14.8 g/dL (ref 13.0–17.7)
Immature Grans (Abs): 0.1 10*3/uL (ref 0.0–0.1)
Immature Granulocytes: 1 %
Lymphocytes Absolute: 2.7 10*3/uL (ref 0.7–3.1)
Lymphs: 26 %
MCH: 30.3 pg (ref 26.6–33.0)
MCHC: 32.3 g/dL (ref 31.5–35.7)
MCV: 94 fL (ref 79–97)
Monocytes Absolute: 0.6 10*3/uL (ref 0.1–0.9)
Monocytes: 6 %
Neutrophils Absolute: 6.6 10*3/uL (ref 1.4–7.0)
Neutrophils: 62 %
Platelets: 255 10*3/uL (ref 150–450)
RBC: 4.88 x10E6/uL (ref 4.14–5.80)
RDW: 13.8 % (ref 11.6–15.4)
WBC: 10.4 10*3/uL (ref 3.4–10.8)

## 2023-07-15 NOTE — Progress Notes (Signed)
Clinical Summary Andrew Phelps is a 78 y.o.male seen today as a new consult, referred by NP [facility] for the following medical problems.   1.Leg edema 09/2020 echo: LVEF 60-65%, no WMAs, normal diastolic function, normal RV - leg swelling started about 10 years ago - taking torsemide 100mg  daily, managed by neprholgy - exertion limited by chronic back pain but can have some SOB - no orthopnea, no PND  2. CKD 3B - followed by Dr Wolfgang Phoenix    3. OSA - from referral note sleep study was abnormal but did not require a cpap   4. History of EtoH and opoid dependence  Past Medical History:  Diagnosis Date   Arthritis    Asthma    Back pain    Family history of adverse reaction to anesthesia    Gout    Hyperlipidemia    Hypertension    Hypothyroidism    Insomnia    Joint pain    Neuropathy    Sleep apnea    not bad enought to do anyting about   Thyroid disease      Allergies  Allergen Reactions   Bee Pollen Other (See Comments)    Seasonal allergies   Codeine     Abdominal pain   Penicillins Rash    Childhood reaction     Current Outpatient Medications  Medication Sig Dispense Refill   allopurinol (ZYLOPRIM) 300 MG tablet TAKE 1 TABLET BY MOUTH DAILY 60 tablet 0   cholecalciferol (VITAMIN D3) 25 MCG (1000 UNIT) tablet Take 1 tablet (1,000 Units total) by mouth daily. 90 tablet 1   doxycycline (VIBRA-TABS) 100 MG tablet Take 1 tablet (100 mg total) by mouth 2 (two) times daily. 20 tablet 0   gabapentin (NEURONTIN) 600 MG tablet TAKE 1 TABLET BY MOUTH THREE TIMES DAILY 180 tablet 0   HYDROcodone-acetaminophen (NORCO) 5-325 MG tablet Take 1 tablet by mouth every 12 (twelve) hours as needed for moderate pain (pain score 4-6). 60 tablet 0   [START ON 08/11/2023] HYDROcodone-acetaminophen (NORCO) 5-325 MG tablet Take 1 tablet by mouth every 12 (twelve) hours as needed for moderate pain (pain score 4-6). 60 tablet 0   [START ON 09/08/2023] HYDROcodone-acetaminophen (NORCO)  5-325 MG tablet Take 1 tablet by mouth every 12 (twelve) hours as needed for moderate pain (pain score 4-6). 60 tablet 0   levothyroxine (SYNTHROID) 112 MCG tablet TAKE 1 TABLET BY MOUTH DAILY 90 tablet 2   loratadine (CLARITIN) 10 MG tablet TAKE 1 TABLET BY MOUTH EVERY DAY 90 tablet 2   mometasone (NASONEX) 50 MCG/ACT nasal spray instill 2 SPRAYS into each nostril DAILY 51 g 2   montelukast (SINGULAIR) 10 MG tablet TAKE 1 TABLET BY MOUTH AT BEDTIME (Patient taking differently: Take 10 mg by mouth daily.) 90 tablet 2   Omega-3 Fatty Acids (OMEGA-3 FISH OIL PO) Take 1 capsule by mouth daily.      potassium chloride SA (KLOR-CON M) 20 MEQ tablet TAKE 1 TABLET BY MOUTH three times WEEKLY ON monday, wednesday, AND friday, DO not crush OR chew 24 tablet 2   simvastatin (ZOCOR) 20 MG tablet TAKE 1 TABLET BY MOUTH DAILY 60 tablet 0   torsemide (DEMADEX) 100 MG tablet TAKE 1 TABLET BY MOUTH DAILY 90 tablet 0   vitamin B-12 (CYANOCOBALAMIN) 100 MCG tablet Take 100 mcg by mouth daily.     vitamin E 180 MG (400 UNITS) capsule Take 400 Units by mouth daily.     No  current facility-administered medications for this visit.     Past Surgical History:  Procedure Laterality Date   COLONOSCOPY WITH PROPOFOL N/A 11/05/2019   Procedure: COLONOSCOPY WITH PROPOFOL;  Surgeon: Corbin Ade, MD;  Location: [facility];  Service: Endoscopy;  Laterality: N/A;  12:30pm   none     POLYPECTOMY  11/05/2019   Procedure: POLYPECTOMY;  Surgeon: Corbin Ade, MD;  Location: [facility];  Service: Endoscopy;;     Allergies  Allergen Reactions   Bee Pollen Other (See Comments)    Seasonal allergies   Codeine     Abdominal pain   Penicillins Rash    Childhood reaction      Family History  Problem Relation Age of Onset   Heart disease Mother    Heart disease Father    Hypertension Brother    Hyperlipidemia Brother    Heart disease Sister    Heart disease Brother    Heart disease Brother    Cancer  Brother    Colon cancer Neg Hx    Liver disease Neg Hx      Social History Mr. Truss reports that he quit smoking about 28 years ago. His smoking use included cigarettes. He started smoking about 53 years ago. He has a 50 pack-year smoking history. His smokeless tobacco use includes chew. Mr. Navarette reports that he does not currently use alcohol.      Physical Examination Today's Vitals   07/15/23 1342  BP: 138/80  Pulse: 96  SpO2: 96%  Weight: 283 lb 12.8 oz (128.7 kg)  Height: 5\' 11"  (1.803 m)   Body mass index is 39.58 kg/m.  Gen: resting comfortably, no acute distress HEENT: no scleral icterus, pupils equal round and reactive, no palptable cervical adenopathy,  CV: RRR, no m/rg, no jvd Resp: Clear to auscultation bilaterally GI: abdomen is soft, non-tender, non-distended, normal bowel sounds, no hepatosplenomegaly MSK: extremities are warm, 2+ bilateral LE edema Skin: warm, no rash Neuro:  no focal deficits Psych: appropriate affect     Assessment and Plan  1.LE edema/SOB - long hsitory of leg swelling, unclear if cardiac component - will plan for echo. If benign would consider vascular consultation - continue to defer diuretics to neprhology at this time        Antoine Poche, M.D.

## 2023-07-15 NOTE — Patient Instructions (Signed)
Medication Instructions:   Continue all current medications.   Labwork:  none  Testing/Procedures:  Your physician has requested that you have an echocardiogram. Echocardiography is a painless test that uses sound waves to create images of your heart. It provides your doctor with information about the size and shape of your heart and how well your heart's chambers and valves are working. This procedure takes approximately one hour. There are no restrictions for this procedure. Please do NOT wear cologne, perfume, aftershave, or lotions (deodorant is allowed). Please arrive 15 minutes prior to your appointment time.  Please note: We ask at that you not bring children with you during ultrasound (echo/ vascular) testing. Due to room size and safety concerns, children are not allowed in the ultrasound rooms during exams. Our front office staff cannot provide observation of children in our lobby area while testing is being conducted. An adult accompanying a patient to their appointment will only be allowed in the ultrasound room at the discretion of the ultrasound technician under special circumstances. We apologize for any inconvenience. Office will contact with results via phone, letter or mychart.     Follow-Up:  Pending test results   Any Other Special Instructions Will Be Listed Below (If Applicable).   If you need a refill on your cardiac medications before your next appointment, please call your pharmacy.

## 2023-07-15 NOTE — Addendum Note (Signed)
Addended by: Lesle Chris on: 07/15/2023 02:19 PM   Modules accepted: Orders

## 2023-07-26 ENCOUNTER — Ambulatory Visit: Payer: 59 | Attending: Cardiology

## 2023-07-26 DIAGNOSIS — R0602 Shortness of breath: Secondary | ICD-10-CM

## 2023-07-26 LAB — ECHOCARDIOGRAM COMPLETE
AR max vel: 5.3 cm2
AV Area VTI: 5.84 cm2
AV Area mean vel: 5.19 cm2
AV Mean grad: 1 mm[Hg]
AV Peak grad: 2.4 mm[Hg]
Ao pk vel: 0.78 m/s
Area-P 1/2: 2.82 cm2
Calc EF: 60.3 %
MV VTI: 4.04 cm2
S' Lateral: 2.8 cm
Single Plane A2C EF: 58.4 %
Single Plane A4C EF: 59.2 %

## 2023-08-01 ENCOUNTER — Other Ambulatory Visit: Payer: Self-pay | Admitting: Family Medicine

## 2023-08-16 ENCOUNTER — Telehealth: Payer: Self-pay | Admitting: *Deleted

## 2023-08-16 DIAGNOSIS — M7989 Other specified soft tissue disorders: Secondary | ICD-10-CM

## 2023-08-16 NOTE — Telephone Encounter (Signed)
 Notified, copy to pcp. He agrees to vascular referral & prefers [facility] location.

## 2023-08-16 NOTE — Telephone Encounter (Signed)
-----   Message from Dina Rich sent at 08/16/2023  9:03 AM EST ----- Echo overall looks good, heart pumping function is normal. There is some mild age related stiffness of the heart which is a common finding and considered minor. Overall heart function looks good. Can we refer him to vacular for leg swelling. , no clear prominent cardiac cause  Dominga Ferry MD

## 2023-08-29 DIAGNOSIS — D631 Anemia in chronic kidney disease: Secondary | ICD-10-CM | POA: Diagnosis not present

## 2023-08-29 DIAGNOSIS — N189 Chronic kidney disease, unspecified: Secondary | ICD-10-CM | POA: Diagnosis not present

## 2023-08-29 DIAGNOSIS — E211 Secondary hyperparathyroidism, not elsewhere classified: Secondary | ICD-10-CM | POA: Diagnosis not present

## 2023-08-29 DIAGNOSIS — R809 Proteinuria, unspecified: Secondary | ICD-10-CM | POA: Diagnosis not present

## 2023-09-01 ENCOUNTER — Other Ambulatory Visit: Payer: Self-pay | Admitting: Family

## 2023-09-01 DIAGNOSIS — E785 Hyperlipidemia, unspecified: Secondary | ICD-10-CM

## 2023-09-01 DIAGNOSIS — N1832 Chronic kidney disease, stage 3b: Secondary | ICD-10-CM

## 2023-09-01 DIAGNOSIS — G6289 Other specified polyneuropathies: Secondary | ICD-10-CM

## 2023-09-05 ENCOUNTER — Other Ambulatory Visit: Payer: Self-pay | Admitting: Family

## 2023-10-13 ENCOUNTER — Ambulatory Visit

## 2023-10-13 ENCOUNTER — Ambulatory Visit (INDEPENDENT_AMBULATORY_CARE_PROVIDER_SITE_OTHER): Payer: 59 | Admitting: Family

## 2023-10-13 ENCOUNTER — Encounter: Payer: Self-pay | Admitting: Family

## 2023-10-13 VITALS — BP 133/73 | HR 82 | Temp 97.0°F | Ht 71.0 in | Wt 268.6 lb

## 2023-10-13 DIAGNOSIS — E785 Hyperlipidemia, unspecified: Secondary | ICD-10-CM

## 2023-10-13 DIAGNOSIS — I129 Hypertensive chronic kidney disease with stage 1 through stage 4 chronic kidney disease, or unspecified chronic kidney disease: Secondary | ICD-10-CM

## 2023-10-13 DIAGNOSIS — Z0001 Encounter for general adult medical examination with abnormal findings: Secondary | ICD-10-CM

## 2023-10-13 DIAGNOSIS — M1990 Unspecified osteoarthritis, unspecified site: Secondary | ICD-10-CM | POA: Diagnosis not present

## 2023-10-13 DIAGNOSIS — M545 Low back pain, unspecified: Secondary | ICD-10-CM | POA: Diagnosis not present

## 2023-10-13 DIAGNOSIS — F1120 Opioid dependence, uncomplicated: Secondary | ICD-10-CM

## 2023-10-13 DIAGNOSIS — J45909 Unspecified asthma, uncomplicated: Secondary | ICD-10-CM

## 2023-10-13 DIAGNOSIS — J302 Other seasonal allergic rhinitis: Secondary | ICD-10-CM

## 2023-10-13 DIAGNOSIS — G6289 Other specified polyneuropathies: Secondary | ICD-10-CM

## 2023-10-13 DIAGNOSIS — R051 Acute cough: Secondary | ICD-10-CM | POA: Diagnosis not present

## 2023-10-13 DIAGNOSIS — I10 Essential (primary) hypertension: Secondary | ICD-10-CM

## 2023-10-13 DIAGNOSIS — Z0289 Encounter for other administrative examinations: Secondary | ICD-10-CM

## 2023-10-13 DIAGNOSIS — Z0000 Encounter for general adult medical examination without abnormal findings: Secondary | ICD-10-CM

## 2023-10-13 DIAGNOSIS — Z6837 Body mass index (BMI) 37.0-37.9, adult: Secondary | ICD-10-CM

## 2023-10-13 DIAGNOSIS — E039 Hypothyroidism, unspecified: Secondary | ICD-10-CM

## 2023-10-13 DIAGNOSIS — N1832 Chronic kidney disease, stage 3b: Secondary | ICD-10-CM

## 2023-10-13 MED ORDER — BUDESONIDE-FORMOTEROL FUMARATE 160-4.5 MCG/ACT IN AERO: 2.0000 | INHALATION_SPRAY | Freq: Two times a day (BID) | RESPIRATORY_TRACT | 3 refills | Status: AC

## 2023-10-13 MED ORDER — HYDROCODONE-ACETAMINOPHEN 5-325 MG PO TABS: 1.0000 | ORAL_TABLET | Freq: Two times a day (BID) | ORAL | 0 refills | Status: DC | PRN

## 2023-10-13 MED ORDER — GABAPENTIN 600 MG PO TABS
600.0000 mg | ORAL_TABLET | Freq: Three times a day (TID) | ORAL | 0 refills | Status: DC
Start: 2023-10-13 — End: 2024-01-09

## 2023-10-13 MED ORDER — VITAMIN D 25 MCG (1000 UNIT) PO TABS: 1000.0000 [IU] | ORAL_TABLET | Freq: Every day | ORAL | 1 refills | Status: AC

## 2023-10-13 MED ORDER — SIMVASTATIN 20 MG PO TABS: 20.0000 mg | ORAL_TABLET | Freq: Every day | ORAL | 2 refills | Status: DC

## 2023-10-13 MED ORDER — LORATADINE 10 MG PO TABS: 10.0000 mg | ORAL_TABLET | Freq: Every day | ORAL | 2 refills | Status: DC

## 2023-10-13 MED ORDER — ALBUTEROL SULFATE HFA 108 (90 BASE) MCG/ACT IN AERS: 2.0000 | INHALATION_SPRAY | Freq: Four times a day (QID) | RESPIRATORY_TRACT | 0 refills | Status: AC | PRN

## 2023-10-13 MED ORDER — MOMETASONE FUROATE 50 MCG/ACT NA SUSP: 2.0000 | Freq: Every day | NASAL | 2 refills | Status: AC

## 2023-10-13 MED ORDER — ALLOPURINOL 300 MG PO TABS
300.0000 mg | ORAL_TABLET | Freq: Every day | ORAL | 0 refills | Status: DC
Start: 2023-10-13 — End: 2024-01-09

## 2023-10-13 MED ORDER — TORSEMIDE 100 MG PO TABS: 100.0000 mg | ORAL_TABLET | Freq: Every day | ORAL | 2 refills | Status: DC

## 2023-10-13 NOTE — Patient Instructions (Signed)

## 2023-10-13 NOTE — Addendum Note (Signed)
 Addended by: Tommas Fragmin A on: 10/13/2023 10:31 AM   Modules accepted: Orders

## 2023-10-13 NOTE — Progress Notes (Addendum)
 Subjective:    Patient ID: Andrew Phelps, male    DOB: [birth year], 78 y.o.   MRN: [id number]  Chief Complaint  Patient presents with   Medical Management of Chronic Issues    Had some virus for 3 weeks and made him very weak.   PT presents the office today for CPE and pain medication refill. PT is followed by Nephrologists every 2 months for CKD.  He has peripheral edema. He is currently taking demadex  100 mg one day and metolazone  5 mg daily. Continues to have peripheral edema and mild SOB.   He is followed by Vascular for PAD.   He is considered morbid obese with a BMI of 37 and co morbidity of HTN and CKD.  Hypertension This is a chronic problem. The current episode started more than 1 year ago. The problem has been resolved since onset. The problem is controlled. Associated symptoms include malaise/fatigue, peripheral edema and shortness of breath ("when I walk a log"). Risk factors for coronary artery disease include dyslipidemia, obesity, male gender and sedentary lifestyle. The current treatment provides moderate improvement. Identifiable causes of hypertension include a thyroid  problem.  Thyroid  Problem Presents for follow-up visit. Symptoms include constipation, dry skin, fatigue and hoarse voice. Patient reports no diarrhea. The symptoms have been stable.  Arthritis Presents for follow-up visit. He complains of pain and stiffness. Affected locations include the left knee, right knee, left MCP and right MCP. His pain is at a severity of 9/10. Associated symptoms include fatigue. Pertinent negatives include no diarrhea.  Insomnia Primary symptoms: sleep disturbance, difficulty falling asleep, malaise/fatigue.   The current episode started more than one year. The onset quality is gradual. The problem occurs intermittently. The problem has been resolved since onset. Past treatments include medication. The treatment provided moderate relief.  Hyperlipidemia This is a chronic  problem. The current episode started more than 1 year ago. The problem is controlled. Recent lipid tests were reviewed and are normal. Exacerbating diseases include obesity. Associated symptoms include shortness of breath ("when I walk a log"). Current antihyperlipidemic treatment includes statins. The current treatment provides mild improvement of lipids. Risk factors for coronary artery disease include dyslipidemia, diabetes mellitus, hypertension, male sex and a sedentary lifestyle.  Back Pain This is a chronic problem. The current episode started more than 1 year ago. The problem occurs intermittently. The problem has been waxing and waning since onset. The pain is present in the lumbar spine. The quality of the pain is described as aching. The pain is at a severity of 9/10. The pain is moderate. He has tried analgesics for the symptoms. The treatment provided moderate relief.  Anemia Presents for follow-up visit. Symptoms include malaise/fatigue.  Cough This is a new problem. The current episode started more than 1 month ago. The problem has been gradually improving. The problem occurs every few minutes. The cough is Productive of sputum. Associated symptoms include nasal congestion, postnasal drip, rhinorrhea and shortness of breath ("when I walk a log"). He has tried rest and OTC cough suppressant for the symptoms.    Current opioids rx- Norco 5-325 mg # meds rx- 60 Effectiveness of current meds-stable Adverse reactions from pain meds-none Morphine equivalent- 10   Pill count performed-No Last drug screen - today ( high risk q73m, moderate risk q12m, low risk yearly ) Urine drug screen today- Yes Was the [facility] reviewed- yes             If yes were their any concerning  findings? - none   Pain contract signed on: 10/13/22  Review of Systems  Constitutional:  Positive for fatigue and malaise/fatigue.  HENT:  Positive for hoarse voice, postnasal drip and rhinorrhea.   Respiratory:   Positive for cough and shortness of breath ("when I walk a log").   Gastrointestinal:  Positive for constipation. Negative for diarrhea.  Musculoskeletal:  Positive for back pain and stiffness.  Psychiatric/Behavioral:  Positive for sleep disturbance.   All other systems reviewed and are negative.  Family History  Problem Relation Age of Onset   Heart disease Mother    Heart disease Father    Hypertension Brother    Hyperlipidemia Brother    Heart disease Sister    Heart disease Brother    Heart disease Brother    Cancer Brother    Colon cancer Neg Hx    Liver disease Neg Hx    Social History   Socioeconomic History   Marital status: Divorced    Spouse name: Not on file   Number of children: 3   Years of education: 8    Highest education level: 8th grade  Occupational History   Occupation: Retired  Tobacco Use   Smoking status: Former    Current packs/day: 0.00    Average packs/day: 2.0 packs/day for 25.0 years (50.0 ttl pk-yrs)    Types: Cigarettes    Start date: 07/05/1970    Quit date: 07/06/1995    Years since quitting: 28.2   Smokeless tobacco: Current    Types: Chew  Vaping Use   Vaping status: Never Used  Substance and Sexual Activity   Alcohol use: Not Currently    Comment: stopped 3 months ago   Drug use: No   Sexual activity: Yes  Other Topics Concern   Not on file  Social History Narrative   Roommate 76 years old   Spends lots of time with brother and children   Social Drivers of Corporate investment banker Strain: Low Risk  (10/18/2022)   Overall Financial Resource Strain (CARDIA)    Difficulty of Paying Living Expenses: Not hard at all  Food Insecurity: No Food Insecurity (02/26/2023)   Hunger Vital Sign    Worried About Running Out of Food in the Last Year: Never true    Ran Out of Food in the Last Year: Never true  Transportation Needs: No Transportation Needs (02/26/2023)   PRAPARE - Administrator, Civil Service (Medical): No     Lack of Transportation (Non-Medical): No  Physical Activity: Sufficiently Active (10/18/2022)   Exercise Vital Sign    Days of Exercise per Week: 5 days    Minutes of Exercise per Session: 30 min  Stress: No Stress Concern Present (10/18/2022)   [employer] of Occupational Health - Occupational Stress Questionnaire    Feeling of Stress : Not at all  Social Connections: Socially Isolated (10/18/2022)   Social Connection and Isolation Panel [NHANES]    Frequency of Communication with Friends and Family: More than three times a week    Frequency of Social Gatherings with Friends and Family: More than three times a week    Attends Religious Services: Never    Database administrator or Organizations: No    Attends Banker Meetings: Never    Marital Status: Divorced       Objective:   Physical Exam Vitals reviewed.  Constitutional:      General: He is not in acute distress.  Appearance: He is well-developed. He is obese.  HENT:     Head: Normocephalic.     Right Ear: Tympanic membrane normal.     Left Ear: Tympanic membrane normal.  Eyes:     General:        Right eye: No discharge.        Left eye: No discharge.     Pupils: Pupils are equal, round, and reactive to light.  Neck:     Thyroid : No thyromegaly.  Cardiovascular:     Rate and Rhythm: Normal rate and regular rhythm.     Heart sounds: Normal heart sounds. No murmur heard. Pulmonary:     Effort: Pulmonary effort is normal. No respiratory distress.     Breath sounds: Normal breath sounds. No wheezing.  Abdominal:     General: Bowel sounds are normal. There is no distension.     Palpations: Abdomen is soft.     Tenderness: There is no abdominal tenderness.  Musculoskeletal:        General: No tenderness. Normal range of motion.     Cervical back: Normal range of motion and neck supple.     Right lower leg: Edema (3+) present.     Left lower leg: Edema (3+) present.     Comments: Bilateral legs  erythemas  Skin:    General: Skin is warm and dry.     Findings: No erythema or rash.  Neurological:     Mental Status: He is alert and oriented to person, place, and time.     Cranial Nerves: No cranial nerve deficit.     Deep Tendon Reflexes: Reflexes are normal and symmetric.  Psychiatric:        Behavior: Behavior normal.        Thought Content: Thought content normal.        Judgment: Judgment normal.       BP 133/73   Pulse 82   Temp (!) 97 F (36.1 C)   Ht 5\' 11"  (1.803 m)   Wt 268 lb 9.6 oz (121.8 kg)   SpO2 97%   BMI 37.46 kg/m      Assessment & Plan:   David Rodriquez comes in today with chief complaint of Medical Management of Chronic Issues (Had some virus for 3 weeks and made him very weak.)   Diagnosis and orders addressed:  1. Stage 3b chronic kidney disease (HCC) - allopurinol  (ZYLOPRIM ) 300 MG tablet; Take 1 tablet (300 mg total) by mouth daily.  Dispense: 60 tablet; Refill: 0 - mometasone  (NASONEX ) 50 MCG/ACT nasal spray; Place 2 sprays into the nose daily.  Dispense: 51 g; Refill: 2 - torsemide  (DEMADEX ) 100 MG tablet; Take 1 tablet (100 mg total) by mouth daily.  Dispense: 90 tablet; Refill: 2 - CMP14+EGFR - CBC with Differential/Platelet - VITAMIN D  25 Hydroxy (Vit-D Deficiency, Fractures)  2. Other polyneuropathy - gabapentin  (NEURONTIN ) 600 MG tablet; Take 1 tablet (600 mg total) by mouth 3 (three) times daily.  Dispense: 180 tablet; Refill: 0 - mometasone  (NASONEX ) 50 MCG/ACT nasal spray; Place 2 sprays into the nose daily.  Dispense: 51 g; Refill: 2 - CMP14+EGFR - CBC with Differential/Platelet  3. Arthritis - HYDROcodone -acetaminophen  (NORCO) 5-325 MG tablet; Take 1 tablet by mouth every 12 (twelve) hours as needed for moderate pain (pain score 4-6).  Dispense: 60 tablet; Refill: 0 - HYDROcodone -acetaminophen  (NORCO) 5-325 MG tablet; Take 1 tablet by mouth every 12 (twelve) hours as needed for moderate pain (pain score 4-6).  Dispense: 60 tablet; Refill: 0 - HYDROcodone -acetaminophen  (NORCO) 5-325 MG tablet; Take 1 tablet by mouth every 12 (twelve) hours as needed for moderate pain (pain score 4-6).  Dispense: 60 tablet; Refill: 0 - mometasone  (NASONEX ) 50 MCG/ACT nasal spray; Place 2 sprays into the nose daily.  Dispense: 51 g; Refill: 2 - CMP14+EGFR - CBC with Differential/Platelet  4. Uncomplicated opioid dependence (HCC) - HYDROcodone -acetaminophen  (NORCO) 5-325 MG tablet; Take 1 tablet by mouth every 12 (twelve) hours as needed for moderate pain (pain score 4-6).  Dispense: 60 tablet; Refill: 0 - HYDROcodone -acetaminophen  (NORCO) 5-325 MG tablet; Take 1 tablet by mouth every 12 (twelve) hours as needed for moderate pain (pain score 4-6).  Dispense: 60 tablet; Refill: 0 - HYDROcodone -acetaminophen  (NORCO) 5-325 MG tablet; Take 1 tablet by mouth every 12 (twelve) hours as needed for moderate pain (pain score 4-6).  Dispense: 60 tablet; Refill: 0 - mometasone  (NASONEX ) 50 MCG/ACT nasal spray; Place 2 sprays into the nose daily.  Dispense: 51 g; Refill: 2 - ToxASSURE Select 13 (MW), Urine - CMP14+EGFR - CBC with Differential/Platelet  5. Chronic bilateral low back pain without sciatica - HYDROcodone -acetaminophen  (NORCO) 5-325 MG tablet; Take 1 tablet by mouth every 12 (twelve) hours as needed for moderate pain (pain score 4-6).  Dispense: 60 tablet; Refill: 0 - HYDROcodone -acetaminophen  (NORCO) 5-325 MG tablet; Take 1 tablet by mouth every 12 (twelve) hours as needed for moderate pain (pain score 4-6).  Dispense: 60 tablet; Refill: 0 - HYDROcodone -acetaminophen  (NORCO) 5-325 MG tablet; Take 1 tablet by mouth every 12 (twelve) hours as needed for moderate pain (pain score 4-6).  Dispense: 60 tablet; Refill: 0 - mometasone  (NASONEX ) 50 MCG/ACT nasal spray; Place 2 sprays into the nose daily.  Dispense: 51 g; Refill: 2 - CMP14+EGFR - CBC with Differential/Platelet  6. Seasonal allergic rhinitis, unspecified  trigger - loratadine  (CLARITIN ) 10 MG tablet; Take 1 tablet (10 mg total) by mouth daily.  Dispense: 90 tablet; Refill: 2 - mometasone  (NASONEX ) 50 MCG/ACT nasal spray; Place 2 sprays into the nose daily.  Dispense: 51 g; Refill: 2 - CMP14+EGFR - CBC with Differential/Platelet  7. Hyperlipidemia, unspecified hyperlipidemia type - mometasone  (NASONEX ) 50 MCG/ACT nasal spray; Place 2 sprays into the nose daily.  Dispense: 51 g; Refill: 2 - simvastatin  (ZOCOR ) 20 MG tablet; Take 1 tablet (20 mg total) by mouth daily.  Dispense: 90 tablet; Refill: 2 - CMP14+EGFR - CBC with Differential/Platelet - Lipid panel  8. Annual physical exam (Primary) - mometasone  (NASONEX ) 50 MCG/ACT nasal spray; Place 2 sprays into the nose daily.  Dispense: 51 g; Refill: 2 - CMP14+EGFR - CBC with Differential/Platelet - Lipid panel - TSH - VITAMIN D  25 Hydroxy (Vit-D Deficiency, Fractures)  9. Essential hypertension - mometasone  (NASONEX ) 50 MCG/ACT nasal spray; Place 2 sprays into the nose daily.  Dispense: 51 g; Refill: 2 - CMP14+EGFR - CBC with Differential/Platelet  10. Hypothyroidism, unspecified type - mometasone  (NASONEX ) 50 MCG/ACT nasal spray; Place 2 sprays into the nose daily.  Dispense: 51 g; Refill: 2 - CMP14+EGFR - CBC with Differential/Platelet - TSH  11. Chronic asthma without complication, unspecified asthma severity, unspecified whether persistent - mometasone  (NASONEX ) 50 MCG/ACT nasal spray; Place 2 sprays into the nose daily.  Dispense: 51 g; Refill: 2 - CMP14+EGFR - CBC with Differential/Platelet - albuterol  (VENTOLIN  HFA) 108 (90 Base) MCG/ACT inhaler; Inhale 2 puffs into the lungs every 6 (six) hours as needed for wheezing or shortness of breath.  Dispense: 8 g; Refill: 0 - budesonide -formoterol  (SYMBICORT )  160-4.5 MCG/ACT inhaler; Inhale 2 puffs into the lungs 2 (two) times daily.  Dispense: 1 each; Refill: 3  12. Morbid obesity (HCC) - mometasone  (NASONEX ) 50 MCG/ACT nasal  spray; Place 2 sprays into the nose daily.  Dispense: 51 g; Refill: 2 - CMP14+EGFR - CBC with Differential/Platelet  13. Acute cough Start Symbicort  and albuterol  as needed Chest x-ray pending  - CMP14+EGFR - CBC with Differential/Platelet - albuterol  (VENTOLIN  HFA) 108 (90 Base) MCG/ACT inhaler; Inhale 2 puffs into the lungs every 6 (six) hours as needed for wheezing or shortness of breath.  Dispense: 8 g; Refill: 0 - budesonide -formoterol  (SYMBICORT ) 160-4.5 MCG/ACT inhaler; Inhale 2 puffs into the lungs 2 (two) times daily.  Dispense: 1 each; Refill: 3 - DG Chest 2 View; Future   Labs pending Patient reviewed in [place] controlled database, no flags noted. Contract and drug screen are up to date.  Keep specialists  Continue current medications  Health Maintenance reviewed Diet and exercise encouraged  Follow up plan: 3 months    Tommas Fragmin, FNP

## 2023-10-14 LAB — CBC WITH DIFFERENTIAL/PLATELET
Basophils Absolute: 0.1 10*3/uL (ref 0.0–0.2)
Basos: 1 %
EOS (ABSOLUTE): 0.1 10*3/uL (ref 0.0–0.4)
Eos: 0 %
Hematocrit: 41.6 % (ref 37.5–51.0)
Hemoglobin: 14.3 g/dL (ref 13.0–17.7)
Immature Grans (Abs): 0.2 10*3/uL — ABNORMAL HIGH (ref 0.0–0.1)
Immature Granulocytes: 1 %
Lymphocytes Absolute: 1.7 10*3/uL (ref 0.7–3.1)
Lymphs: 9 %
MCH: 31.6 pg (ref 26.6–33.0)
MCHC: 34.4 g/dL (ref 31.5–35.7)
MCV: 92 fL (ref 79–97)
Monocytes Absolute: 0.9 10*3/uL (ref 0.1–0.9)
Monocytes: 5 %
Neutrophils Absolute: 15.8 10*3/uL — ABNORMAL HIGH (ref 1.4–7.0)
Neutrophils: 84 %
Platelets: 287 10*3/uL (ref 150–450)
RBC: 4.52 x10E6/uL (ref 4.14–5.80)
RDW: 13.8 % (ref 11.6–15.4)
WBC: 18.8 10*3/uL — ABNORMAL HIGH (ref 3.4–10.8)

## 2023-10-14 LAB — LIPID PANEL
Chol/HDL Ratio: 2.5 ratio (ref 0.0–5.0)
Cholesterol, Total: 86 mg/dL — ABNORMAL LOW (ref 100–199)
HDL: 35 mg/dL — ABNORMAL LOW (ref >39)
LDL Chol Calc (NIH): 35 mg/dL (ref 0–99)
Triglycerides: 74 mg/dL (ref 0–149)
VLDL Cholesterol Cal: 16 mg/dL (ref 5–40)

## 2023-10-14 LAB — CMP14+EGFR
ALT: 83 IU/L — ABNORMAL HIGH (ref 0–44)
AST: 50 IU/L — ABNORMAL HIGH (ref 0–40)
Albumin: 4.2 g/dL (ref 3.8–4.8)
Alkaline Phosphatase: 112 IU/L (ref 44–121)
BUN/Creatinine Ratio: 25 — ABNORMAL HIGH (ref 10–24)
BUN: 62 mg/dL — ABNORMAL HIGH (ref 8–27)
Bilirubin Total: 0.9 mg/dL (ref 0.0–1.2)
CO2: 24 mmol/L (ref 20–29)
Calcium: 10 mg/dL (ref 8.6–10.2)
Chloride: 78 mmol/L — ABNORMAL LOW (ref 96–106)
Creatinine, Ser: 2.5 mg/dL — ABNORMAL HIGH (ref 0.76–1.27)
Globulin, Total: 3.8 g/dL (ref 1.5–4.5)
Glucose: 121 mg/dL — ABNORMAL HIGH (ref 70–99)
Potassium: 3.9 mmol/L (ref 3.5–5.2)
Sodium: 124 mmol/L — ABNORMAL LOW (ref 134–144)
Total Protein: 8 g/dL (ref 6.0–8.5)
eGFR: 26 mL/min/{1.73_m2} — ABNORMAL LOW (ref >59)

## 2023-10-14 LAB — TSH: TSH: 2.91 u[IU]/mL (ref 0.450–4.500)

## 2023-10-14 LAB — VITAMIN D 25 HYDROXY (VIT D DEFICIENCY, FRACTURES): Vit D, 25-Hydroxy: 43.5 ng/mL (ref 30.0–100.0)

## 2023-10-17 ENCOUNTER — Other Ambulatory Visit: Payer: Self-pay | Admitting: Family

## 2023-10-17 LAB — TOXASSURE SELECT 13 (MW), URINE

## 2023-10-17 MED ORDER — DOXYCYCLINE HYCLATE 100 MG PO TABS: 100.0000 mg | ORAL_TABLET | Freq: Two times a day (BID) | ORAL | 0 refills | Status: DC

## 2023-10-17 MED ORDER — METOLAZONE 5 MG PO TABS: 2.5000 mg | ORAL_TABLET | ORAL | 1 refills | Status: AC

## 2023-10-18 ENCOUNTER — Other Ambulatory Visit (INDEPENDENT_AMBULATORY_CARE_PROVIDER_SITE_OTHER)

## 2023-10-18 DIAGNOSIS — R059 Cough, unspecified: Secondary | ICD-10-CM | POA: Diagnosis not present

## 2023-10-18 DIAGNOSIS — R051 Acute cough: Secondary | ICD-10-CM

## 2023-10-18 DIAGNOSIS — I700 Atherosclerosis of aorta: Secondary | ICD-10-CM | POA: Diagnosis not present

## 2023-10-21 ENCOUNTER — Ambulatory Visit (INDEPENDENT_AMBULATORY_CARE_PROVIDER_SITE_OTHER): Payer: 59

## 2023-10-21 VITALS — Ht 71.0 in | Wt 268.0 lb

## 2023-10-21 DIAGNOSIS — Z0000 Encounter for general adult medical examination without abnormal findings: Secondary | ICD-10-CM | POA: Diagnosis not present

## 2023-10-21 NOTE — Patient Instructions (Signed)
 [name] , Thank you for taking time to come for your Medicare Wellness Visit. I appreciate your ongoing commitment to your health goals. Please review the following plan we discussed and let me know if I can assist you in the future.   Referrals/Orders/Follow-Ups/Clinician Recommendations:    Next Medicare AWV: Oct 23, 2024 at 8:40 am telephone visit.      Aim for 30 minutes of exercise or brisk walking, 6-8 glasses of water, and 5 servings of fruits and vegetables each day.    This is a list of the screening recommended for you and due dates:  Health Maintenance  Topic Date Due   COVID-19 Vaccine (4 - 2024-25 season) 10/29/2023*   DTaP/Tdap/Td vaccine (2 - Td or Tdap) 10/12/2024*   Flu Shot  01/20/2024   Medicare Annual Wellness Visit  10/20/2024   Colon Cancer Screening  11/04/2029   Pneumonia Vaccine  Completed   Hepatitis C Screening  Completed   Zoster (Shingles) Vaccine  Completed   HPV Vaccine  Aged Out   Meningitis B Vaccine  Aged Out  *Topic was postponed. The date shown is not the original due date.    Advanced directives: (Declined) Advance directive discussed with you today. Even though you declined this today, please call our office should you change your mind, and we can give you the proper paperwork for you to fill out. Advance Care Planning is important because it:  [x]  Makes sure you receive the medical care that is consistent with your values, goals, and preferences  [x]  It provides guidance to your family and loved ones and it also reduces their decisional burden about whether or not they are making the right decisions based on what you want done  Follow the link provided in your after visit summary or read over the paperwork we have mailed to you to help you started getting your Advance Directives in place. If you need assistance in completing these, please reach out to us  so that we can help you!  Next Medicare Annual Wellness Visit scheduled for next year:  yes Managing Pain Without Opioids Opioids are strong medicines used to treat moderate to severe pain. For some people, especially those who have long-term (chronic) pain, opioids may not be the best choice for pain management due to: Side effects like nausea, constipation, and sleepiness. The risk of addiction (opioid use disorder). The longer you take opioids, the greater your risk of addiction. Pain that lasts for more than 3 months is called chronic pain. Managing chronic pain usually requires more than one approach and is often provided by a team of health care providers working together (multidisciplinary approach). Pain management may be done at a pain management center or pain clinic. How to manage pain without the use of opioids Use non-opioid medicines Non-opioid medicines for pain may include: Over-the-counter or prescription non-steroidal anti-inflammatory drugs (NSAIDs). These may be the first medicines used for pain. They work well for muscle and bone pain, and they reduce swelling. Acetaminophen . This over-the-counter medicine may work well for milder pain but not swelling. Antidepressants. These may be used to treat chronic pain. A certain type of antidepressant (tricyclics) is often used. These medicines are given in lower doses for pain than when used for depression. Anticonvulsants. These are usually used to treat seizures but may also reduce nerve (neuropathic) pain. Muscle relaxants. These relieve pain caused by sudden muscle tightening (spasms). You may also use a pain medicine that is applied to the skin as  a patch, cream, or gel (topical analgesic), such as a numbing medicine. These may cause fewer side effects than medicines taken by mouth. Do certain therapies as directed Some therapies can help with pain management. They include: Physical therapy. You will do exercises to gain strength and flexibility. A physical therapist may teach you exercises to move and stretch parts  of your body that are weak, stiff, or painful. You can learn these exercises at physical therapy visits and practice them at home. Physical therapy may also involve: Massage. Heat wraps or applying heat or cold to affected areas. Electrical signals that interrupt pain signals (transcutaneous electrical nerve stimulation, TENS). Weak lasers that reduce pain and swelling (low-level laser therapy). Signals from your body that help you learn to regulate pain (biofeedback). Occupational therapy. This helps you to learn ways to function at home and work with less pain. Recreational therapy. This involves trying new activities or hobbies, such as a physical activity or drawing. Mental health therapy, including: Cognitive behavioral therapy (CBT). This helps you learn coping skills for dealing with pain. Acceptance and commitment therapy (ACT) to change the way you think and react to pain. Relaxation therapies, including muscle relaxation exercises and mindfulness-based stress reduction. Pain management counseling. This may be individual, family, or group counseling.  Receive medical treatments Medical treatments for pain management include: Nerve block injections. These may include a pain blocker and anti-inflammatory medicines. You may have injections: Near the spine to relieve chronic back or neck pain. Into joints to relieve back or joint pain. Into nerve areas that supply a painful area to relieve body pain. Into muscles (trigger point injections) to relieve some painful muscle conditions. A medical device placed near your spine to help block pain signals and relieve nerve pain or chronic back pain (spinal cord stimulation device). Acupuncture. Follow these instructions at home Medicines Take over-the-counter and prescription medicines only as told by your health care provider. If you are taking pain medicine, ask your health care providers about possible side effects to watch out for. Do not  drive or use heavy machinery while taking prescription opioid pain medicine.  Lifestyle  Do not use drugs or alcohol to reduce pain. If you drink alcohol, limit how much you have to: 0-1 drink a day for women who are not pregnant. 0-2 drinks a day for men. Know how much alcohol is in a drink. In the [place] one drink equals one 12 oz bottle of beer (355 mL), one 5 oz glass of wine (148 mL), or one 1 oz glass of hard liquor (44 mL). Do not use any products that contain nicotine or tobacco. These products include cigarettes, chewing tobacco, and vaping devices, such as e-cigarettes. If you need help quitting, ask your health care provider. Eat a healthy diet and maintain a healthy weight. Poor diet and excess weight may make pain worse. Eat foods that are high in fiber. These include fresh fruits and vegetables, whole grains, and beans. Limit foods that are high in fat and processed sugars, such as fried and sweet foods. Exercise regularly. Exercise lowers stress and may help relieve pain. Ask your health care provider what activities and exercises are safe for you. If your health care provider approves, join an exercise class that combines movement and stress reduction. Examples include yoga and tai chi. Get enough sleep. Lack of sleep may make pain worse. Lower stress as much as possible. Practice stress reduction techniques as told by your therapist. General instructions Work with all  your pain management providers to find the treatments that work best for you. You are an important member of your pain management team. There are many things you can do to reduce pain on your own. Consider joining an online or in-person support group for people who have chronic pain. Keep all follow-up visits. This is important. Where to find more information You can find more information about managing pain without opioids from: American Academy of Pain Medicine: painmed.org Institute for Chronic Pain:  instituteforchronicpain.org American Chronic Pain Association: theacpa.org Contact a health care provider if: You have side effects from pain medicine. Your pain gets worse or does not get better with treatments or home therapy. You are struggling with anxiety or depression. Summary Many types of pain can be managed without opioids. Chronic pain may respond better to pain management without opioids. Pain is best managed when you and a team of health care providers work together. Pain management without opioids may include non-opioid medicines, medical treatments, physical therapy, mental health therapy, and lifestyle changes. Tell your health care providers if your pain gets worse or is not being managed well enough. This information is not intended to replace advice given to you by your health care provider. Make sure you discuss any questions you have with your health care provider. Document Revised: 09/17/2020 Document Reviewed: 09/17/2020 Elsevier Patient Education  2024 [employer].     Understanding Your Risk for Falls Millions of people have serious injuries from falls each year. It is important to understand your risk of falling. Talk with your health care provider about your risk and what you can do to lower it. If you do have a serious fall, make sure to tell your provider. Falling once raises your risk of falling again. How can falls affect me? Serious injuries from falls are common. These include: Broken bones, such as hip fractures. Head injuries, such as traumatic brain injuries (TBI) or concussions. A fear of falling can cause you to avoid activities and stay at home. This can make your muscles weaker and raise your risk for a fall. What can increase my risk? There are a number of risk factors that increase your risk for falling. The more risk factors you have, the higher your risk of falling. Serious injuries from a fall happen most often to people who are older than 76  years old. Teenagers and young adults ages 59-29 are also at higher risk. Common risk factors include: Weakness in the lower body. Being generally weak or confused due to long-term (chronic) illness. Dizziness or balance problems. Poor vision. Medicines that cause dizziness or drowsiness. These may include: Medicines for your blood pressure, heart, anxiety, insomnia, or swelling (edema). Pain medicines. Muscle relaxants. Other risk factors include: Drinking alcohol. Having had a fall in the past. Having foot pain or wearing improper footwear. Working at a dangerous job. Having any of the following in your home: Tripping hazards, such as floor clutter or loose rugs. Poor lighting. Pets. Having dementia or memory loss. What actions can I take to lower my risk of falling?  Physical activity Stay physically fit. Do strength and balance exercises. Consider taking a regular class to build strength and balance. Yoga and tai chi are good options. Vision Have your eyes checked every year and your prescription for glasses or contacts updated as needed. Shoes and walking aids Wear non-skid shoes. Wear shoes that have rubber soles and low heels. Do not wear high heels. Do not walk around the house in  socks or slippers. Use a cane or walker as told by your provider. Home safety Attach secure railings on both sides of your stairs. Install grab bars for your bathtub, shower, and toilet. Use a non-skid mat in your bathtub or shower. Attach bath mats securely with double-sided, non-slip rug tape. Use good lighting in all rooms. Keep a flashlight near your bed. Make sure there is a clear path from your bed to the bathroom. Use night-lights. Do not use throw rugs. Make sure all carpeting is taped or tacked down securely. Remove all clutter from walkways and stairways, including extension cords. Repair uneven or broken steps and floors. Avoid walking on icy or slippery surfaces. Walk on the grass  instead of on icy or slick sidewalks. Use ice melter to get rid of ice on walkways in the winter. Use a cordless phone. Questions to ask your health care provider Can you help me check my risk for a fall? Do any of my medicines make me more likely to fall? Should I take a vitamin D  supplement? What exercises can I do to improve my strength and balance? Should I make an appointment to have my vision checked? Do I need a bone density test to check for weak bones (osteoporosis)? Would it help to use a cane or a walker? Where to find more information Centers for Disease Control and Prevention, [employer]: TonerPromos.no Community-Based Fall Prevention Programs: TonerPromos.no [employer] on Aging: BaseRingTones.pl Contact a health care provider if: You fall at home. You are afraid of falling at home. You feel weak, drowsy, or dizzy. This information is not intended to replace advice given to you by your health care provider. Make sure you discuss any questions you have with your health care provider. Document Revised: 02/08/2022 Document Reviewed: 02/08/2022 Elsevier Patient Education  2024 [employer].

## 2023-10-21 NOTE — Progress Notes (Signed)
 Please attest and cosign this visit due to patients primary care provider not being in the office at the time the visit was completed.   Subjective:   Andrew Phelps is a 78 y.o. who presents for a Medicare Wellness preventive visit.  Visit Complete: Virtual I connected with  Andrew Phelps on 10/21/23 by a audio enabled telemedicine application and verified that I am speaking with the correct person using two identifiers.  Patient Location: Home  Provider Location: Home Office  I discussed the limitations of evaluation and management by telemedicine. The patient expressed understanding and agreed to proceed.  Vital Signs: Because this visit was a virtual/telehealth visit, some criteria may be missing or patient reported. Any vitals not documented were not able to be obtained and vitals that have been documented are patient reported.  VideoDeclined- This patient declined Librarian, academic. Therefore the visit was completed with audio only.  Persons Participating in Visit: Patient.  AWV Questionnaire: No: Patient Medicare AWV questionnaire was not completed prior to this visit.  Cardiac Risk Factors include: advanced age (>63men, >59 women);dyslipidemia;hypertension;male gender;obesity (BMI >30kg/m2);smoking/ tobacco exposure     Objective:    Today's Vitals   10/21/23 1352  Weight: 268 lb (121.6 kg)  Height: 5\' 11"  (1.803 m)  PainSc: 9   PainLoc: Leg   Body mass index is 37.38 kg/m.     10/21/2023    1:58 PM 02/26/2023   11:00 PM 10/18/2022    1:24 PM 07/15/2022    3:14 PM 07/15/2022   10:36 AM 10/09/2021    3:37 PM 10/07/2020   10:07 AM  Advanced Directives  Does Patient Have a Medical Advance Directive? No No No  No No No  Would patient like information on creating a medical advance directive? No - Patient declined No - Patient declined Yes (MAU/Ambulatory/Procedural Areas - Information given) No - Patient declined  No - Patient  declined No - Patient declined    Current Medications (verified) Outpatient Encounter Medications as of 10/21/2023  Medication Sig   albuterol  (VENTOLIN  HFA) 108 (90 Base) MCG/ACT inhaler Inhale 2 puffs into the lungs every 6 (six) hours as needed for wheezing or shortness of breath.   allopurinol  (ZYLOPRIM ) 300 MG tablet Take 1 tablet (300 mg total) by mouth daily.   budesonide -formoterol  (SYMBICORT ) 160-4.5 MCG/ACT inhaler Inhale 2 puffs into the lungs 2 (two) times daily.   cholecalciferol  (VITAMIN D3) 25 MCG (1000 UNIT) tablet Take 1 tablet (1,000 Units total) by mouth daily.   doxycycline  (VIBRA -TABS) 100 MG tablet Take 1 tablet (100 mg total) by mouth 2 (two) times daily.   gabapentin  (NEURONTIN ) 600 MG tablet Take 1 tablet (600 mg total) by mouth 3 (three) times daily.   HYDROcodone -acetaminophen  (NORCO) 5-325 MG tablet Take 1 tablet by mouth every 12 (twelve) hours as needed for moderate pain (pain score 4-6).   [START ON 11/08/2023] HYDROcodone -acetaminophen  (NORCO) 5-325 MG tablet Take 1 tablet by mouth every 12 (twelve) hours as needed for moderate pain (pain score 4-6).   [START ON 12/09/2023] HYDROcodone -acetaminophen  (NORCO) 5-325 MG tablet Take 1 tablet by mouth every 12 (twelve) hours as needed for moderate pain (pain score 4-6).   levothyroxine  (SYNTHROID ) 112 MCG tablet TAKE 1 TABLET BY MOUTH DAILY   loratadine  (CLARITIN ) 10 MG tablet Take 1 tablet (10 mg total) by mouth daily.   metolazone  (ZAROXOLYN ) 5 MG tablet Take 0.5 tablets (2.5 mg total) by mouth once a week.   mometasone  (NASONEX ) 50 MCG/ACT  nasal spray Place 2 sprays into the nose daily.   montelukast  (SINGULAIR ) 10 MG tablet TAKE 1 TABLET BY MOUTH AT BEDTIME (Patient taking differently: Take 10 mg by mouth daily.)   Omega-3 Fatty Acids (OMEGA-3 FISH OIL PO) Take 1 capsule by mouth daily.    potassium chloride  SA (KLOR-CON  M) 20 MEQ tablet TAKE 1 TABLET BY MOUTH three times WEEKLY ON monday, wednesday, AND friday, DO not  crush OR chew   simvastatin  (ZOCOR ) 20 MG tablet Take 1 tablet (20 mg total) by mouth daily.   torsemide  (DEMADEX ) 100 MG tablet Take 1 tablet (100 mg total) by mouth daily.   vitamin B-12 (CYANOCOBALAMIN) 100 MCG tablet Take 100 mcg by mouth daily.   vitamin E 180 MG (400 UNITS) capsule Take 400 Units by mouth daily.   No facility-administered encounter medications on file as of 10/21/2023.    Allergies (verified) Bee pollen, Codeine, and Penicillins   History: Past Medical History:  Diagnosis Date   Arthritis    Asthma    Back pain    Family history of adverse reaction to anesthesia    Gout    Hyperlipidemia    Hypertension    Hypothyroidism    Insomnia    Joint pain    Neuropathy    Sleep apnea    not bad enought to do anyting about   Thyroid  disease    Past Surgical History:  Procedure Laterality Date   COLONOSCOPY WITH PROPOFOL  N/A 11/05/2019   Procedure: COLONOSCOPY WITH PROPOFOL ;  Surgeon: Suzette Espy, MD;  Location: [facility];  Service: Endoscopy;  Laterality: N/A;  12:30pm   none     POLYPECTOMY  11/05/2019   Procedure: POLYPECTOMY;  Surgeon: Suzette Espy, MD;  Location: [facility];  Service: Endoscopy;;   Family History  Problem Relation Age of Onset   Heart disease Mother    Heart disease Father    Hypertension Brother    Hyperlipidemia Brother    Heart disease Sister    Heart disease Brother    Heart disease Brother    Cancer Brother    Colon cancer Neg Hx    Liver disease Neg Hx    Social History   Socioeconomic History   Marital status: Divorced    Spouse name: Not on file   Number of children: 3   Years of education: 8    Highest education level: 8th grade  Occupational History   Occupation: Retired  Tobacco Use   Smoking status: Former    Current packs/day: 0.00    Average packs/day: 2.0 packs/day for 25.0 years (50.0 ttl pk-yrs)    Types: Cigarettes    Start date: 07/05/1970    Quit date: 07/06/1995    Years since quitting:  28.3   Smokeless tobacco: Current    Types: Chew  Vaping Use   Vaping status: Never Used  Substance and Sexual Activity   Alcohol use: Not Currently    Comment: stopped 3 months ago   Drug use: No   Sexual activity: Yes  Other Topics Concern   Not on file  Social History Narrative   Roommate 29 years old   Spends lots of time with brother and children   Social Drivers of Health   Financial Resource Strain: Low Risk  (10/21/2023)   Overall Financial Resource Strain (CARDIA)    Difficulty of Paying Living Expenses: Not hard at all  Food Insecurity: No Food Insecurity (10/21/2023)   Hunger Vital Sign  Worried About Programme researcher, broadcasting/film/video in the Last Year: Never true    Ran Out of Food in the Last Year: Never true  Transportation Needs: No Transportation Needs (10/21/2023)   PRAPARE - Administrator, Civil Service (Medical): No    Lack of Transportation (Non-Medical): No  Physical Activity: Sufficiently Active (10/21/2023)   Exercise Vital Sign    Days of Exercise per Week: 7 days    Minutes of Exercise per Session: 30 min  Stress: No Stress Concern Present (10/21/2023)   [employer] of Occupational Health - Occupational Stress Questionnaire    Feeling of Stress : Not at all  Social Connections: Socially Isolated (10/21/2023)   Social Connection and Isolation Panel [NHANES]    Frequency of Communication with Friends and Family: More than three times a week    Frequency of Social Gatherings with Friends and Family: More than three times a week    Attends Religious Services: Never    Database administrator or Organizations: No    Attends Engineer, structural: Never    Marital Status: Divorced    Tobacco Counseling Ready to quit: No Counseling given: Yes    Clinical Intake:  Pre-visit preparation completed: Yes  Pain : No/denies pain Pain Score: 9      BMI - recorded: 37.38 Nutritional Status: BMI > 30  Obese Nutritional Risks: None Diabetes:  No  Lab Results  Component Value Date   HGBA1C 5.4 02/24/2017     How often do you need to have someone help you when you read instructions, pamphlets, or other written materials from your doctor or pharmacy?: 1 - Never  Interpreter Needed?: No  Information entered by :: Rockne Chyle W CMA   Activities of Daily Living     10/21/2023    1:55 PM 02/26/2023   11:00 PM  In your present state of health, do you have any difficulty performing the following activities:  Hearing? 1 1  Comment wears hearing aids   Vision? 0 0  Difficulty concentrating or making decisions? 0 0  Walking or climbing stairs? 0 1  Dressing or bathing? 0 0  Doing errands, shopping? 0 1  Preparing Food and eating ? N   Using the Toilet? N   In the past six months, have you accidently leaked urine? N   Do you have problems with loss of bowel control? N   Managing your Medications? N   Managing your Finances? N   Housekeeping or managing your Housekeeping? N     Patient Care Team: Yevette Hem, FNP as PCP - General (Family Medicine) Amanda Jungling Joyceann No, MD as PCP - Cardiology (Cardiology) Riley Cheadle Windsor Hatcher, MD as Consulting Physician (Gastroenterology) Jane Meager, MD as Consulting Physician (Nephrology) Dr [facility], OD (Optometry)  Indicate any recent Medical Services you may have received from other than [facility] providers in the past year (date may be approximate).     Assessment:   This is a routine wellness examination for Max Meadows.  Hearing/Vision screen Hearing Screening - Comments:: Patient wears hearing aids. He is up to date with exams Vision Screening - Comments:: Wears rx glasses - up to date with routine eye exams  Sees Dr. Buck Carbon at [facility]   Goals Addressed             This Visit's Progress    Remain active and independent   On track      Depression Screen  10/21/2023    2:00 PM 10/13/2023    8:04 AM 07/14/2023   10:20 AM 04/12/2023    9:33 AM  01/07/2023   10:23 AM 10/18/2022    1:22 PM 10/11/2022    9:39 AM  PHQ 2/9 Scores  PHQ - 2 Score 0 0 0 0 0 0 0  PHQ- 9 Score 0 0  1  2 2     Fall Risk     10/21/2023    1:59 PM 04/12/2023    9:32 AM 10/18/2022    1:19 PM 10/11/2022    9:34 AM 07/27/2022   11:48 AM  Fall Risk   Falls in the past year? 0 0 0 0 0  Number falls in past yr: 0 0 0 0   Injury with Fall? 0 0 0 0   Risk for fall due to : No Fall Risks No Fall Risks No Fall Risks No Fall Risks   Follow up Falls prevention discussed;Falls evaluation completed Falls evaluation completed;Education provided Falls prevention discussed;Education provided;Falls evaluation completed Falls evaluation completed     MEDICARE RISK AT HOME:  Medicare Risk at Home Any stairs in or around the home?: No If so, are there any without handrails?: No Home free of loose throw rugs in walkways, pet beds, electrical cords, etc?: Yes Adequate lighting in your home to reduce risk of falls?: Yes Life alert?: No Use of a cane, walker or w/c?: Yes Grab bars in the bathroom?: No Shower chair or bench in shower?: No Elevated toilet seat or a handicapped toilet?: No  TIMED UP AND GO:  Was the test performed?  No  Cognitive Function: 6CIT completed    07/14/2017    8:51 AM  MMSE - Mini Mental State Exam  Orientation to time 5  Orientation to Place 5  Registration 3  Attention/ Calculation 5  Recall 2  Language- name 2 objects 2  Language- repeat 1  Language- follow 3 step command 3  Language- read & follow direction 1  Write a sentence 1  Copy design 1  Total score 29        10/21/2023    2:00 PM 10/18/2022    1:24 PM 10/09/2021    3:38 PM 10/07/2020   10:07 AM 10/05/2019   10:45 AM  6CIT Screen  What Year? 0 points 0 points 0 points 0 points 0 points  What month? 0 points 0 points 0 points 0 points 0 points  What time? 0 points 0 points 0 points 0 points 0 points  Count back from 20 0 points 0 points 2 points 4 points 0 points  Months  in reverse 0 points 2 points 2 points 0 points 0 points  Repeat phrase 0 points 2 points 4 points 8 points 2 points  Total Score 0 points 4 points 8 points 12 points 2 points    Immunizations Immunization History  Administered Date(s) Administered   Fluad Quad(high Dose 65+) 07/02/2019, 04/01/2020, 04/07/2021, 04/09/2022   Fluad Trivalent(High Dose 65+) 02/27/2023   Influenza, High Dose Seasonal PF 06/06/2018   Influenza,inj,Quad PF,6+ Mos 07/05/2013, 05/07/2015, 03/16/2017   Moderna Sars-Covid-2 Vaccination 09/06/2019, 10/04/2019, 06/03/2020   Pneumococcal Conjugate-13 07/05/2013, 12/11/2014   Pneumococcal Polysaccharide-23 11/18/2016   Tdap 10/11/2013   Zoster Recombinant(Shingrix) 06/04/2020, 01/01/2021    Screening Tests Health Maintenance  Topic Date Due   COVID-19 Vaccine (4 - 2024-25 season) 10/29/2023 (Originally 02/20/2023)   DTaP/Tdap/Td (2 - Td or Tdap) 10/12/2024 (Originally 10/12/2023)   INFLUENZA VACCINE  01/20/2024   Medicare Annual Wellness (AWV)  10/20/2024   Colonoscopy  11/04/2029   Pneumonia Vaccine 47+ Years old  Completed   Hepatitis C Screening  Completed   Zoster Vaccines- Shingrix  Completed   HPV VACCINES  Aged Out   Meningococcal B Vaccine  Aged Out    Health Maintenance  There are no preventive care reminders to display for this patient. Health Maintenance Items Addressed: Patient declined colon cancer screening  Additional Screening:  Vision Screening: Recommended annual ophthalmology exams for early detection of glaucoma and other disorders of the eye.  Dental Screening: Recommended annual dental exams for proper oral hygiene  Community Resource Referral / Chronic Care Management: CRR required this visit?  No   CCM required this visit?  No     Plan:     I have personally reviewed and noted the following in the patient's chart:   Medical and social history Use of alcohol, tobacco or illicit drugs  Current medications and  supplements including opioid prescriptions. Patient is currently taking opioid prescriptions. Information provided to patient regarding non-opioid alternatives. Patient advised to discuss non-opioid treatment plan with their provider. Functional ability and status Nutritional status Physical activity Advanced directives List of other physicians Hospitalizations, surgeries, and ER visits in previous 12 months Vitals Screenings to include cognitive, depression, and falls Referrals and appointments  In addition, I have reviewed and discussed with patient certain preventive protocols, quality metrics, and best practice recommendations. A written personalized care plan for preventive services as well as general preventive health recommendations were provided to patient.      Schawn Byas, CMA   10/21/2023   After Visit Summary: (Mail) Due to this being a telephonic visit, the after visit summary with patients personalized plan was offered to patient via mail   Notes: Please refer to Routing Comments.

## 2023-10-31 ENCOUNTER — Other Ambulatory Visit: Payer: Self-pay | Admitting: Family

## 2023-10-31 DIAGNOSIS — J45909 Unspecified asthma, uncomplicated: Secondary | ICD-10-CM

## 2023-10-31 DIAGNOSIS — E039 Hypothyroidism, unspecified: Secondary | ICD-10-CM

## 2023-11-18 ENCOUNTER — Other Ambulatory Visit: Payer: Self-pay | Admitting: Vascular Surgery

## 2023-11-18 DIAGNOSIS — M7989 Other specified soft tissue disorders: Secondary | ICD-10-CM

## 2023-12-07 ENCOUNTER — Encounter: Payer: Self-pay | Admitting: Physician Assistant

## 2023-12-07 ENCOUNTER — Ambulatory Visit (HOSPITAL_COMMUNITY)
Admission: RE | Admit: 2023-12-07 | Discharge: 2023-12-07 | Disposition: A | Discharge status: Home / Self Care | Source: Ambulatory Visit | Attending: Vascular Surgery | Admitting: Vascular Surgery

## 2023-12-07 ENCOUNTER — Ambulatory Visit (INDEPENDENT_AMBULATORY_CARE_PROVIDER_SITE_OTHER)

## 2023-12-07 VITALS — BP 106/72 | HR 86 | Temp 97.8°F | Ht 71.0 in | Wt 266.5 lb

## 2023-12-07 DIAGNOSIS — I872 Venous insufficiency (chronic) (peripheral): Secondary | ICD-10-CM | POA: Diagnosis not present

## 2023-12-07 DIAGNOSIS — M7989 Other specified soft tissue disorders: Secondary | ICD-10-CM

## 2023-12-07 NOTE — Progress Notes (Signed)
 Requested by:  Laurann Pollock, MD [address] [place],  [place] [address]  Reason for consultation: bilateral leg swelling    History of Present Illness   Andrew Phelps is a 78 y.o. (March 18, 1946) male who presents with his friend for evaluation of leg swelling, right > left. The swelling waxes and wanes he explains over 10 years. He explains that he has known CKD III. 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 His legs have intermittently become very swollen and uncomfortable with scaly dry skin. He has had blisters come and go as well. Currently over past week he had one on each leg pop. He says they heal fairly fast. He explains that he has worn compression stockings in the past and had to cut them off and will not wear them again. He lives independently and he cannot get them on himself. He does elevate in a recliner but admittedly not very high. He sleeps in his recliner so he says he tries to elevate them as much as he can at night time. He does use a stationary elliptical like machine and has lost > 20 lbs over past couple months. He is trying to watch his diet and increase his activity.  He does not ambulate much secondary to  bone on bone in his knees and requires use of a cane. He also has chronic back pain. He also is limited in his activity because he becomes short of breath. He does not have history of DVT. No family history to his knowledge.   Venous symptoms include: tired, swelling, scaly skin Onset/duration:  > 10 years  Occupation:  retired Aggravating factors: sitting, standing Alleviating factors: elevation Compression: yes  Helps:  no Pain medications:  yes, takes for chronic back pain and knee pain Previous vein procedures:  No History of DVT:  No  Past Medical History:  Diagnosis Date   Arthritis    Asthma    Back pain    Family history of adverse reaction to anesthesia    Gout    Hyperlipidemia    Hypertension    Hypothyroidism     Insomnia    Joint pain    Neuropathy    Sleep apnea    not bad enought to do anyting about   Thyroid  disease     Past Surgical History:  Procedure Laterality Date   COLONOSCOPY WITH PROPOFOL  N/A 11/05/2019   Procedure: COLONOSCOPY WITH PROPOFOL ;  Surgeon: Suzette Espy, MD;  Location: [facility];  Service: Endoscopy;  Laterality: N/A;  12:30pm   none     POLYPECTOMY  11/05/2019   Procedure: POLYPECTOMY;  Surgeon: Suzette Espy, MD;  Location: [facility];  Service: Endoscopy;;    Social History   Socioeconomic History   Marital status: Divorced    Spouse name: Not on file   Number of children: 3   Years of education: 8    Highest education level: 8th grade  Occupational History   Occupation: Retired  Tobacco Use   Smoking status: Former    Current packs/day: 0.00    Average packs/day: 2.0 packs/day for 25.0 years (50.0 ttl pk-yrs)    Types: Cigarettes    Start date: 07/05/1970    Quit date: 07/06/1995    Years since quitting: 28.4   Smokeless tobacco: Current    Types: Chew  Vaping Use   Vaping status: Never Used  Substance and Sexual Activity   Alcohol use: Not Currently  Comment: stopped 3 months ago   Drug use: No   Sexual activity: Yes  Other Topics Concern   Not on file  Social History Narrative   Roommate 48 years old   Spends lots of time with brother and children   Social Drivers of Corporate investment banker Strain: Low Risk  (10/21/2023)   Overall Financial Resource Strain (CARDIA)    Difficulty of Paying Living Expenses: Not hard at all  Food Insecurity: No Food Insecurity (10/21/2023)   Hunger Vital Sign    Worried About Running Out of Food in the Last Year: Never true    Ran Out of Food in the Last Year: Never true  Transportation Needs: No Transportation Needs (10/21/2023)   PRAPARE - Administrator, Civil Service (Medical): No    Lack of Transportation (Non-Medical): No  Physical Activity: Sufficiently Active (10/21/2023)    Exercise Vital Sign    Days of Exercise per Week: 7 days    Minutes of Exercise per Session: 30 min  Stress: No Stress Concern Present (10/21/2023)   [employer] of Occupational Health - Occupational Stress Questionnaire    Feeling of Stress : Not at all  Social Connections: Socially Isolated (10/21/2023)   Social Connection and Isolation Panel    Frequency of Communication with Friends and Family: More than three times a week    Frequency of Social Gatherings with Friends and Family: More than three times a week    Attends Religious Services: Never    Database administrator or Organizations: No    Attends Banker Meetings: Never    Marital Status: Divorced  Catering manager Violence: Not At Risk (10/21/2023)   Humiliation, Afraid, Rape, and Kick questionnaire    Fear of Current or Ex-Partner: No    Emotionally Abused: No    Physically Abused: No    Sexually Abused: No    Family History  Problem Relation Age of Onset   Heart disease Mother    Heart disease Father    Hypertension Brother    Hyperlipidemia Brother    Heart disease Sister    Heart disease Brother    Heart disease Brother    Cancer Brother    Colon cancer Neg Hx    Liver disease Neg Hx     Current Outpatient Medications  Medication Sig Dispense Refill   albuterol  (VENTOLIN  HFA) 108 (90 Base) MCG/ACT inhaler Inhale 2 puffs into the lungs every 6 (six) hours as needed for wheezing or shortness of breath. 8 g 0   allopurinol  (ZYLOPRIM ) 300 MG tablet Take 1 tablet (300 mg total) by mouth daily. 60 tablet 0   budesonide -formoterol  (SYMBICORT ) 160-4.5 MCG/ACT inhaler Inhale 2 puffs into the lungs 2 (two) times daily. 1 each 3   cholecalciferol  (VITAMIN D3) 25 MCG (1000 UNIT) tablet Take 1 tablet (1,000 Units total) by mouth daily. 90 tablet 1   doxycycline  (VIBRA -TABS) 100 MG tablet Take 1 tablet (100 mg total) by mouth 2 (two) times daily. 20 tablet 0   gabapentin  (NEURONTIN ) 600 MG tablet Take 1 tablet  (600 mg total) by mouth 3 (three) times daily. 180 tablet 0   HYDROcodone -acetaminophen  (NORCO) 5-325 MG tablet Take 1 tablet by mouth every 12 (twelve) hours as needed for moderate pain (pain score 4-6). 60 tablet 0   HYDROcodone -acetaminophen  (NORCO) 5-325 MG tablet Take 1 tablet by mouth every 12 (twelve) hours as needed for moderate pain (pain score 4-6). 60 tablet 0   [  START ON 12/09/2023] HYDROcodone -acetaminophen  (NORCO) 5-325 MG tablet Take 1 tablet by mouth every 12 (twelve) hours as needed for moderate pain (pain score 4-6). 60 tablet 0   levothyroxine  (SYNTHROID ) 112 MCG tablet TAKE 1 TABLET BY MOUTH DAILY 90 tablet 3   loratadine  (CLARITIN ) 10 MG tablet Take 1 tablet (10 mg total) by mouth daily. 90 tablet 2   metolazone  (ZAROXOLYN ) 5 MG tablet Take 0.5 tablets (2.5 mg total) by mouth once a week. 45 tablet 1   mometasone  (NASONEX ) 50 MCG/ACT nasal spray Place 2 sprays into the nose daily. 51 g 2   montelukast  (SINGULAIR ) 10 MG tablet TAKE 1 TABLET BY MOUTH AT BEDTIME 90 tablet 2   Omega-3 Fatty Acids (OMEGA-3 FISH OIL PO) Take 1 capsule by mouth daily.      potassium chloride  SA (KLOR-CON  M) 20 MEQ tablet TAKE 1 TABLET BY MOUTH three times WEEKLY ON monday, wednesday, AND friday, DO not crush OR chew 24 tablet 2   simvastatin  (ZOCOR ) 20 MG tablet Take 1 tablet (20 mg total) by mouth daily. 90 tablet 2   torsemide  (DEMADEX ) 100 MG tablet Take 1 tablet (100 mg total) by mouth daily. 90 tablet 2   vitamin B-12 (CYANOCOBALAMIN) 100 MCG tablet Take 100 mcg by mouth daily.     vitamin E 180 MG (400 UNITS) capsule Take 400 Units by mouth daily.     No current facility-administered medications for this visit.    Allergies  Allergen Reactions   Bee Pollen Other (See Comments)    Seasonal allergies   Codeine     Abdominal pain   Penicillins Rash    Childhood reaction    REVIEW OF SYSTEMS (negative unless checked):   Cardiac:  []  Chest pain or chest pressure? []  Shortness of breath  upon activity? []  Shortness of breath when lying flat? []  Irregular heart rhythm?  Vascular:  []  Pain in calf, thigh, or hip brought on by walking? []  Pain in feet at night that wakes you up from your sleep? []  Blood clot in your veins? [x]  Leg swelling?  Pulmonary:  []  Oxygen at home? []  Productive cough? []  Wheezing?  Neurologic:  []  Sudden weakness in arms or legs? []  Sudden numbness in arms or legs? []  Sudden onset of difficult speaking or slurred speech? []  Temporary loss of vision in one eye? []  Problems with dizziness?  Gastrointestinal:  []  Blood in stool? []  Vomited blood?  Genitourinary:  []  Burning when urinating? []  Blood in urine?  Psychiatric:  []  Major depression  Hematologic:  []  Bleeding problems? []  Problems with blood clotting?  Dermatologic:  []  Rashes or ulcers?  Constitutional:  []  Fever or chills?  Ear/Nose/Throat:  []  Change in hearing? []  Nose bleeds? []  Sore throat?  Musculoskeletal:  []  Back pain? []  Joint pain? []  Muscle pain?   Physical Examination     Vitals:   12/07/23 1228  BP: 106/72  Pulse: 86  Temp: 97.8 F (36.6 C)  TempSrc: Temporal  SpO2: 98%  Weight: 266 lb 8 oz (120.9 kg)  Height: 5' 11 (1.803 m)   Body mass index is 37.17 kg/m.  General:  overweight male in NAD; vital signs documented above Gait: Not observed, in wheel chair HENT: WNL, normocephalic Pulmonary: normal non-labored breathing , without wheezing Cardiac: regular HR Abdomen: soft Vascular Exam/Pulses: 2+ femoral, 2+ DP pulses bilaterally Extremities: without varicose veins, without reticular veins, with edema, without stasis pigmentation, without lipodermatosclerosis, without ulcers Musculoskeletal: no muscle wasting or atrophy  Neurologic: A&O X 3;  No focal weakness or paresthesias are detected Psychiatric:  The pt has Normal affect.  Non-invasive Vascular Imaging   BLE Venous Insufficiency Duplex (12/07/23):  RLE:  No DVT and  SVT GSV reflux only in calf GSV diameter 0.37-0.45 cm No SSV reflux No deep venous reflux  Medical Decision Making   Andrew Phelps is a 78 y.o. male who presents with: BLE chronic venous insufficiency with swelling. He has a long history of swelling in his legs for > 10 years. His duplex today shows no DVT or SVT. He has some reflux in his GSV only in the calf. No deep or SSV reflux noted. Based on this duplex he would not be a candidate for a venous ablation. He also is not willing to wear compression stockings due to bad experience with them in the past so this would make any intervention challenging. I tried to educate him on the role of compression stockings however he is not interested in trying them again. I suspect he also has component of lymphedema as well and I believe he also has some edema in his legs from his CKD Based on the patient's history and examination, I recommend: daily elevation of 20-30 minutes above level of heart, daily compression stocking use, exercise, weight reduction, refraining from prolonged sitting or standing. Patient was provided with information about vein health He can follow up as needed if he has any new or worsening symptoms   Deneen Finical, PA-C [facility] Office: [phone]  12/07/2023, 2:30 PM  Clinic MD: Vikki Graves

## 2023-12-12 DIAGNOSIS — N184 Chronic kidney disease, stage 4 (severe): Secondary | ICD-10-CM | POA: Diagnosis not present

## 2023-12-12 DIAGNOSIS — N2581 Secondary hyperparathyroidism of renal origin: Secondary | ICD-10-CM | POA: Diagnosis not present

## 2023-12-12 DIAGNOSIS — G4733 Obstructive sleep apnea (adult) (pediatric): Secondary | ICD-10-CM | POA: Diagnosis not present

## 2023-12-12 DIAGNOSIS — I129 Hypertensive chronic kidney disease with stage 1 through stage 4 chronic kidney disease, or unspecified chronic kidney disease: Secondary | ICD-10-CM | POA: Diagnosis not present

## 2024-01-09 ENCOUNTER — Other Ambulatory Visit: Payer: Self-pay | Admitting: Family

## 2024-01-09 DIAGNOSIS — N1832 Chronic kidney disease, stage 3b: Secondary | ICD-10-CM

## 2024-01-09 DIAGNOSIS — G6289 Other specified polyneuropathies: Secondary | ICD-10-CM

## 2024-01-24 ENCOUNTER — Encounter: Payer: Self-pay | Admitting: Family

## 2024-01-24 ENCOUNTER — Ambulatory Visit: Admitting: Family

## 2024-01-24 VITALS — BP 106/68 | HR 80 | Temp 98.8°F | Ht 71.0 in | Wt 265.0 lb

## 2024-01-24 DIAGNOSIS — Z0289 Encounter for other administrative examinations: Secondary | ICD-10-CM

## 2024-01-24 DIAGNOSIS — E039 Hypothyroidism, unspecified: Secondary | ICD-10-CM

## 2024-01-24 DIAGNOSIS — M1711 Unilateral primary osteoarthritis, right knee: Secondary | ICD-10-CM | POA: Diagnosis not present

## 2024-01-24 DIAGNOSIS — M545 Low back pain, unspecified: Secondary | ICD-10-CM

## 2024-01-24 DIAGNOSIS — N1832 Chronic kidney disease, stage 3b: Secondary | ICD-10-CM

## 2024-01-24 DIAGNOSIS — M1990 Unspecified osteoarthritis, unspecified site: Secondary | ICD-10-CM

## 2024-01-24 DIAGNOSIS — I10 Essential (primary) hypertension: Secondary | ICD-10-CM | POA: Diagnosis not present

## 2024-01-24 DIAGNOSIS — E785 Hyperlipidemia, unspecified: Secondary | ICD-10-CM

## 2024-01-24 DIAGNOSIS — F1120 Opioid dependence, uncomplicated: Secondary | ICD-10-CM

## 2024-01-24 DIAGNOSIS — G8929 Other chronic pain: Secondary | ICD-10-CM

## 2024-01-24 MED ORDER — HYDROCODONE-ACETAMINOPHEN 5-325 MG PO TABS
1.0000 | ORAL_TABLET | Freq: Two times a day (BID) | ORAL | 0 refills | Status: DC | PRN
Start: 2024-03-22 — End: 2024-04-26

## 2024-01-24 MED ORDER — HYDROCODONE-ACETAMINOPHEN 5-325 MG PO TABS
1.0000 | ORAL_TABLET | Freq: Two times a day (BID) | ORAL | 0 refills | Status: DC | PRN
Start: 2024-01-24 — End: 2024-04-26

## 2024-01-24 MED ORDER — HYDROCODONE-ACETAMINOPHEN 5-325 MG PO TABS
1.0000 | ORAL_TABLET | Freq: Two times a day (BID) | ORAL | 0 refills | Status: DC | PRN
Start: 2024-02-21 — End: 2024-04-26

## 2024-01-24 NOTE — Patient Instructions (Signed)
 Chronic Kidney Disease in Adults: What to Know Chronic kidney disease (CKD) is when lasting damage happens to the kidneys slowly over time. The kidneys are two organs that do many important things in the body. These include: Taking waste and extra fluid out of the blood to make pee (urine). Making hormones. Keeping the right amount of fluids and chemicals in the body. A small amount of kidney damage may not cause problems. You must take steps to help keep the kidney damage from getting worse. A lot of damage may cause kidney failure. Kidney failure means the kidneys can no longer work right. What are the causes? Diabetes. High blood pressure. Diseases that affect the heart and blood vessels. Other kidney diseases. Diseases that affect the body's defense system (immune system). A problem with the flow of pee. This may be caused by: Kidney stones. Cancer. An enlarged prostate, in males. A kidney infection or urinary tract infection (UTI) that keeps coming back. What increases the risk? Getting older. The chances of having CKD increase with age. A family history of kidney disease or kidney failure. Having a disease caused by genes. Taking medicines that can harm the kidneys. Being near or having contact with harmful substances. Being very overweight. Using tobacco now or in the past. What are the signs or symptoms? Common symptoms of CKD include: Feeling very tired and having less energy. Swelling of the face, legs, ankles, or feet. Throwing up or feeling like you may throw up. Not wanting to eat as much as normal. Being confused or not able to focus. Twitches and cramps in the leg muscles or other muscles. Dry, itchy skin. Other symptoms may include: Shortness of breath. Trouble sleeping. Making less pee, or making more pee, especially at night. A taste of metal in your mouth. You may also become anemic. Anemia means there's not enough red blood cells in your blood. You may get  symptoms slowly. You may not notice them until the kidney damage gets very bad. How is this diagnosed? CKD may be diagnosed based on: Tests on your blood or pee. Imaging tests, like an ultrasound or a CT scan. A kidney biopsy. For this test, a sample of kidney tissue is removed to be looked at under a microscope. These tests will help to find out how serious the CKD is. How is this treated? Often, there's no cure for CKD. Treatment can help with symptoms and help keep the disease from getting worse. Treatment may include: Treating other problems that are causing your CKD or making it worse. Diet changes. You may need to: Avoid alcohol. Avoid foods that are high in salt, potassium, phosphorous, and protein. Taking medicines for symptoms and to help control other conditions. Dialysis. This treatment gets harmful waste out of your body. It may be needed if you have kidney failure. Follow these instructions at home: Medicines Take your medicines only as told. The amount of some medicines you take may need to be changed. Do not take any new medicines, vitamins, or supplements unless your health care provider says it's okay. These may make kidney damage worse. Lifestyle Do not smoke, vape, or use nicotine or tobacco. If you drink alcohol: Limit how much you have to: 0-1 drink a day if you're male. 0-2 drinks a day if you're male. Know how much alcohol is in your drink. In the [place] one drink is one 12 oz bottle of beer (355 mL), one 5 oz glass of wine (148 mL), or one 1 oz  glass of hard liquor (44 mL). Stay at a healthy weight. If you need help, ask your provider. General instructions  Eat and drink as told. Track your blood pressure at home. Tell your provider about any changes. If you have diabetes, track your blood sugar as told. Exercise at least 30 minutes a day, 5 days a week. Keep your shots (vaccinations) up to date. Keep all follow-up visits. Your provider may need to change  your treatments over time. Where to find support American Kidney Fund: [place].com.au Kidney School: kidneyschool.org American Association of Kidney Patients: https://www.miller-montoya.com/ Where to find more information National Kidney Foundation: kidney.org Centers for Disease Control and Prevention. To learn more: Go to DiningCalendar.de. Click "Search". Type "chronic kidney disease" in the search box. Contact a health care provider if: You have new symptoms. You get symptoms of end-stage kidney disease. These include: Headaches. Numbness in your hands or feet. Leg cramps. Easy bruising. Get help right away if: You have a fever. You make less pee than usual. You have pain or bleeding when you pee or poop. You have chest pain. You have shortness of breath. These symptoms may be an emergency. Call 911 right away. Do not wait to see if the symptoms will go away. Do not drive yourself to the hospital. This information is not intended to replace advice given to you by your health care provider. Make sure you discuss any questions you have with your health care provider. Document Revised: 04/19/2023 Document Reviewed: 12/10/2022 Elsevier Patient Education  2024 [employer].

## 2024-01-24 NOTE — Progress Notes (Signed)
 Subjective:    Patient ID: Andrew Phelps, male    DOB: October 12, 1945, 78 y.o.   MRN: [id number]  Chief Complaint  Patient presents with   Medical Management of Chronic Issues    3 month follow up   PT presents the office today for chronic follow up and pain medication refill.   PT is followed by Nephrologists every 2 months for CKD.  He has peripheral edema. He is currently taking demadex  100 mg one day and metolazone  5 mg twice a week. Swelling has dramatically improved!   He is followed by Vascular for PAD.   He is considered morbid obese with a BMI of 36 and co morbidity of HTN and CKD.  Hypertension This is a chronic problem. The current episode started more than 1 year ago. The problem has been resolved since onset. The problem is controlled. Associated symptoms include peripheral edema. Pertinent negatives include no malaise/fatigue. Risk factors for coronary artery disease include dyslipidemia, obesity, male gender and sedentary lifestyle. The current treatment provides moderate improvement. Hypertensive end-organ damage includes kidney disease. Identifiable causes of hypertension include a thyroid  problem.  Thyroid  Problem Presents for follow-up visit. Symptoms include dry skin. Patient reports no constipation, diarrhea or fatigue. The symptoms have been stable.  Arthritis Presents for follow-up visit. He complains of pain and stiffness. Affected locations include the left knee, right knee, left MCP and right MCP. His pain is at a severity of 9/10. Pertinent negatives include no diarrhea or fatigue.  Insomnia Primary symptoms: sleep disturbance, difficulty falling asleep, no malaise/fatigue.   The current episode started more than one year. The onset quality is gradual. The problem occurs intermittently. The problem has been resolved since onset. Past treatments include medication. The treatment provided moderate relief.  Hyperlipidemia This is a chronic problem. The  current episode started more than 1 year ago. The problem is controlled. Recent lipid tests were reviewed and are normal. Exacerbating diseases include obesity. Current antihyperlipidemic treatment includes statins. The current treatment provides moderate improvement of lipids. Risk factors for coronary artery disease include dyslipidemia, diabetes mellitus, hypertension, male sex and a sedentary lifestyle.  Back Pain This is a chronic problem. The current episode started more than 1 year ago. The problem occurs intermittently. The problem has been waxing and waning since onset. The pain is present in the lumbar spine. The quality of the pain is described as aching. The pain is at a severity of 9/10. The pain is moderate. He has tried analgesics for the symptoms. The treatment provided moderate relief.  Anemia Presents for follow-up visit. There has been no malaise/fatigue.    Current opioids rx- Norco 5-325 mg # meds rx- 60 Effectiveness of current meds-stable Adverse reactions from pain meds-none Morphine equivalent- 10   Pill count performed-No Last drug screen - today ( high risk q86m, moderate risk q23m, low risk yearly ) Urine drug screen today- Yes Was the [facility] reviewed- yes             If yes were their any concerning findings? - none   Pain contract signed on: 10/13/23  Review of Systems  Constitutional:  Negative for fatigue and malaise/fatigue.  Gastrointestinal:  Negative for constipation and diarrhea.  Musculoskeletal:  Positive for back pain and stiffness.  Psychiatric/Behavioral:  Positive for sleep disturbance.   All other systems reviewed and are negative.  Family History  Problem Relation Age of Onset   Heart disease Mother    Heart disease Father  Hypertension Brother    Hyperlipidemia Brother    Heart disease Sister    Heart disease Brother    Heart disease Brother    Cancer Brother    Colon cancer Neg Hx    Liver disease Neg Hx    Social History    Socioeconomic History   Marital status: Divorced    Spouse name: Not on file   Number of children: 3   Years of education: 8    Highest education level: 8th grade  Occupational History   Occupation: Retired  Tobacco Use   Smoking status: Former    Current packs/day: 0.00    Average packs/day: 2.0 packs/day for 25.0 years (50.0 ttl pk-yrs)    Types: Cigarettes    Start date: 07/05/1970    Quit date: 07/06/1995    Years since quitting: 28.5   Smokeless tobacco: Current    Types: Chew  Vaping Use   Vaping status: Never Used  Substance and Sexual Activity   Alcohol use: Not Currently    Comment: stopped 3 months ago   Drug use: No   Sexual activity: Yes  Other Topics Concern   Not on file  Social History Narrative   Roommate 31 years old   Spends lots of time with brother and children   Social Drivers of Corporate investment banker Strain: Low Risk  (10/21/2023)   Overall Financial Resource Strain (CARDIA)    Difficulty of Paying Living Expenses: Not hard at all  Food Insecurity: No Food Insecurity (10/21/2023)   Hunger Vital Sign    Worried About Running Out of Food in the Last Year: Never true    Ran Out of Food in the Last Year: Never true  Transportation Needs: No Transportation Needs (10/21/2023)   PRAPARE - Administrator, Civil Service (Medical): No    Lack of Transportation (Non-Medical): No  Physical Activity: Sufficiently Active (10/21/2023)   Exercise Vital Sign    Days of Exercise per Week: 7 days    Minutes of Exercise per Session: 30 min  Stress: No Stress Concern Present (10/21/2023)   [employer] of Occupational Health - Occupational Stress Questionnaire    Feeling of Stress : Not at all  Social Connections: Socially Isolated (10/21/2023)   Social Connection and Isolation Panel    Frequency of Communication with Friends and Family: More than three times a week    Frequency of Social Gatherings with Friends and Family: More than three times a  week    Attends Religious Services: Never    Database administrator or Organizations: No    Attends Banker Meetings: Never    Marital Status: Divorced       Objective:   Physical Exam Vitals reviewed.  Constitutional:      General: He is not in acute distress.    Appearance: He is well-developed. He is obese.  HENT:     Head: Normocephalic.     Right Ear: Tympanic membrane normal.     Left Ear: Tympanic membrane normal.  Eyes:     General:        Right eye: No discharge.        Left eye: No discharge.     Pupils: Pupils are equal, round, and reactive to light.  Neck:     Thyroid : No thyromegaly.  Cardiovascular:     Rate and Rhythm: Normal rate and regular rhythm.     Heart sounds: Normal heart sounds. No  murmur heard. Pulmonary:     Effort: Pulmonary effort is normal. No respiratory distress.     Breath sounds: Normal breath sounds. No wheezing.  Abdominal:     General: Bowel sounds are normal. There is no distension.     Palpations: Abdomen is soft.     Tenderness: There is no abdominal tenderness.  Musculoskeletal:        General: No tenderness. Normal range of motion.     Cervical back: Normal range of motion and neck supple.     Right lower leg: Edema (3+) present.     Left lower leg: Edema (2+) present.     Comments: Bilateral legs erythemas  Skin:    General: Skin is warm and dry.     Findings: No erythema or rash.  Neurological:     Mental Status: He is alert and oriented to person, place, and time.     Cranial Nerves: No cranial nerve deficit.     Deep Tendon Reflexes: Reflexes are normal and symmetric.  Psychiatric:        Behavior: Behavior normal.        Thought Content: Thought content normal.        Judgment: Judgment normal.       BP 106/68   Pulse 80   Temp 98.8 F (37.1 C)   Ht 5' 11 (1.803 m)   Wt 265 lb (120.2 kg)   SpO2 98%   BMI 36.96 kg/m      Assessment & Plan:   Andrew Phelps comes in today with  chief complaint of Medical Management of Chronic Issues (3 month follow up)   Diagnosis and orders addressed:  1. Arthritis - HYDROcodone -acetaminophen  (NORCO) 5-325 MG tablet; Take 1 tablet by mouth every 12 (twelve) hours as needed for moderate pain (pain score 4-6).  Dispense: 60 tablet; Refill: 0 - HYDROcodone -acetaminophen  (NORCO) 5-325 MG tablet; Take 1 tablet by mouth every 12 (twelve) hours as needed for moderate pain (pain score 4-6).  Dispense: 60 tablet; Refill: 0 - HYDROcodone -acetaminophen  (NORCO) 5-325 MG tablet; Take 1 tablet by mouth every 12 (twelve) hours as needed for moderate pain (pain score 4-6).  Dispense: 60 tablet; Refill: 0 - CMP14+EGFR - CBC with Differential/Platelet  2. Uncomplicated opioid dependence (HCC) - HYDROcodone -acetaminophen  (NORCO) 5-325 MG tablet; Take 1 tablet by mouth every 12 (twelve) hours as needed for moderate pain (pain score 4-6).  Dispense: 60 tablet; Refill: 0 - HYDROcodone -acetaminophen  (NORCO) 5-325 MG tablet; Take 1 tablet by mouth every 12 (twelve) hours as needed for moderate pain (pain score 4-6).  Dispense: 60 tablet; Refill: 0 - HYDROcodone -acetaminophen  (NORCO) 5-325 MG tablet; Take 1 tablet by mouth every 12 (twelve) hours as needed for moderate pain (pain score 4-6).  Dispense: 60 tablet; Refill: 0 - CMP14+EGFR - CBC with Differential/Platelet  3. Chronic bilateral low back pain without sciatica - HYDROcodone -acetaminophen  (NORCO) 5-325 MG tablet; Take 1 tablet by mouth every 12 (twelve) hours as needed for moderate pain (pain score 4-6).  Dispense: 60 tablet; Refill: 0 - HYDROcodone -acetaminophen  (NORCO) 5-325 MG tablet; Take 1 tablet by mouth every 12 (twelve) hours as needed for moderate pain (pain score 4-6).  Dispense: 60 tablet; Refill: 0 - HYDROcodone -acetaminophen  (NORCO) 5-325 MG tablet; Take 1 tablet by mouth every 12 (twelve) hours as needed for moderate pain (pain score 4-6).  Dispense: 60 tablet; Refill: 0 -  CMP14+EGFR - CBC with Differential/Platelet  4. Essential hypertension (Primary) - CMP14+EGFR - CBC with Differential/Platelet  5. Hypothyroidism,  unspecified type - CMP14+EGFR - CBC with Differential/Platelet  6. Hyperlipidemia, unspecified hyperlipidemia type - CMP14+EGFR - CBC with Differential/Platelet  7. Morbid obesity (HCC) - CMP14+EGFR - CBC with Differential/Platelet  8. Pain medication agreement signed - CMP14+EGFR - CBC with Differential/Platelet  9. Unilateral primary osteoarthritis, right knee - CMP14+EGFR - CBC with Differential/Platelet  10. Stage 3b chronic kidney disease (HCC) - CMP14+EGFR - CBC with Differential/Platelet   Labs pending Patient reviewed in [place] controlled database, no flags noted. Contract and drug screen are up to date.  Keep specialists  Continue current medications  Health Maintenance reviewed Diet and exercise encouraged  Follow up plan: 3 months    Bari Learn, FNP

## 2024-01-25 LAB — CBC WITH DIFFERENTIAL/PLATELET
Basophils Absolute: 0.1 x10E3/uL (ref 0.0–0.2)
Basos: 1 %
EOS (ABSOLUTE): 0.5 x10E3/uL — ABNORMAL HIGH (ref 0.0–0.4)
Eos: 3 %
Hematocrit: 42.7 % (ref 37.5–51.0)
Hemoglobin: 14.3 g/dL (ref 13.0–17.7)
Immature Grans (Abs): 0.3 x10E3/uL — ABNORMAL HIGH (ref 0.0–0.1)
Immature Granulocytes: 2 %
Lymphocytes Absolute: 2.7 x10E3/uL (ref 0.7–3.1)
Lymphs: 17 %
MCH: 32.8 pg (ref 26.6–33.0)
MCHC: 33.5 g/dL (ref 31.5–35.7)
MCV: 98 fL — ABNORMAL HIGH (ref 79–97)
Monocytes Absolute: 0.8 x10E3/uL (ref 0.1–0.9)
Monocytes: 5 %
Neutrophils Absolute: 11 x10E3/uL — ABNORMAL HIGH (ref 1.4–7.0)
Neutrophils: 72 %
Platelets: 232 x10E3/uL (ref 150–450)
RBC: 4.36 x10E6/uL (ref 4.14–5.80)
RDW: 14 % (ref 11.6–15.4)
WBC: 15.4 x10E3/uL — ABNORMAL HIGH (ref 3.4–10.8)

## 2024-01-25 LAB — CMP14+EGFR
ALT: 14 IU/L (ref 0–44)
AST: 22 IU/L (ref 0–40)
Albumin: 4 g/dL (ref 3.8–4.8)
Alkaline Phosphatase: 96 IU/L (ref 44–121)
BUN/Creatinine Ratio: 15 (ref 10–24)
BUN: 34 mg/dL — AB (ref 8–27)
Bilirubin Total: 0.6 mg/dL (ref 0.0–1.2)
CO2: 24 mmol/L (ref 20–29)
Calcium: 9.6 mg/dL (ref 8.6–10.2)
Chloride: 88 mmol/L — AB (ref 96–106)
Creatinine, Ser: 2.28 mg/dL — AB (ref 0.76–1.27)
Globulin, Total: 3.4 g/dL (ref 1.5–4.5)
Glucose: 100 mg/dL — AB (ref 70–99)
Potassium: 5.1 mmol/L (ref 3.5–5.2)
Sodium: 127 mmol/L — AB (ref 134–144)
Total Protein: 7.4 g/dL (ref 6.0–8.5)
eGFR: 29 mL/min/1.73 — AB (ref >59)

## 2024-01-26 ENCOUNTER — Ambulatory Visit: Payer: Self-pay | Admitting: Family

## 2024-01-27 ENCOUNTER — Other Ambulatory Visit: Payer: Self-pay | Admitting: Family

## 2024-01-27 MED ORDER — DOXYCYCLINE HYCLATE 100 MG PO TABS: 100.0000 mg | ORAL_TABLET | Freq: Two times a day (BID) | ORAL | 0 refills | Status: DC

## 2024-02-26 ENCOUNTER — Other Ambulatory Visit: Payer: Self-pay | Admitting: Family

## 2024-03-08 ENCOUNTER — Other Ambulatory Visit: Payer: Self-pay | Admitting: Family

## 2024-03-08 DIAGNOSIS — N1832 Chronic kidney disease, stage 3b: Secondary | ICD-10-CM

## 2024-03-08 DIAGNOSIS — G6289 Other specified polyneuropathies: Secondary | ICD-10-CM

## 2024-03-28 DIAGNOSIS — R809 Proteinuria, unspecified: Secondary | ICD-10-CM | POA: Diagnosis not present

## 2024-03-28 DIAGNOSIS — N1832 Chronic kidney disease, stage 3b: Secondary | ICD-10-CM | POA: Diagnosis not present

## 2024-03-28 DIAGNOSIS — D631 Anemia in chronic kidney disease: Secondary | ICD-10-CM | POA: Diagnosis not present

## 2024-04-02 DIAGNOSIS — G4733 Obstructive sleep apnea (adult) (pediatric): Secondary | ICD-10-CM | POA: Diagnosis not present

## 2024-04-02 DIAGNOSIS — I5032 Chronic diastolic (congestive) heart failure: Secondary | ICD-10-CM | POA: Diagnosis not present

## 2024-04-02 DIAGNOSIS — R809 Proteinuria, unspecified: Secondary | ICD-10-CM | POA: Diagnosis not present

## 2024-04-02 DIAGNOSIS — N2581 Secondary hyperparathyroidism of renal origin: Secondary | ICD-10-CM | POA: Diagnosis not present

## 2024-04-26 ENCOUNTER — Ambulatory Visit: Payer: Self-pay | Admitting: Family

## 2024-04-26 ENCOUNTER — Encounter: Payer: Self-pay | Admitting: Family

## 2024-04-26 VITALS — BP 105/68 | HR 87 | Temp 97.3°F | Ht 71.0 in | Wt 260.6 lb

## 2024-04-26 DIAGNOSIS — E785 Hyperlipidemia, unspecified: Secondary | ICD-10-CM

## 2024-04-26 DIAGNOSIS — G6289 Other specified polyneuropathies: Secondary | ICD-10-CM | POA: Diagnosis not present

## 2024-04-26 DIAGNOSIS — F1120 Opioid dependence, uncomplicated: Secondary | ICD-10-CM

## 2024-04-26 DIAGNOSIS — I10 Essential (primary) hypertension: Secondary | ICD-10-CM

## 2024-04-26 DIAGNOSIS — M1990 Unspecified osteoarthritis, unspecified site: Secondary | ICD-10-CM | POA: Diagnosis not present

## 2024-04-26 DIAGNOSIS — Z23 Encounter for immunization: Secondary | ICD-10-CM

## 2024-04-26 DIAGNOSIS — E039 Hypothyroidism, unspecified: Secondary | ICD-10-CM

## 2024-04-26 DIAGNOSIS — M545 Low back pain, unspecified: Secondary | ICD-10-CM

## 2024-04-26 DIAGNOSIS — G8929 Other chronic pain: Secondary | ICD-10-CM

## 2024-04-26 DIAGNOSIS — G4700 Insomnia, unspecified: Secondary | ICD-10-CM

## 2024-04-26 DIAGNOSIS — N184 Chronic kidney disease, stage 4 (severe): Secondary | ICD-10-CM

## 2024-04-26 DIAGNOSIS — I5032 Chronic diastolic (congestive) heart failure: Secondary | ICD-10-CM

## 2024-04-26 MED ORDER — HYDROCODONE-ACETAMINOPHEN 5-325 MG PO TABS: 1.0000 | ORAL_TABLET | Freq: Two times a day (BID) | ORAL | 0 refills | Status: AC | PRN

## 2024-04-26 MED ORDER — GABAPENTIN 600 MG PO TABS: 600.0000 mg | ORAL_TABLET | Freq: Three times a day (TID) | ORAL | 2 refills | Status: AC

## 2024-04-26 NOTE — Patient Instructions (Signed)
 Peripheral Edema  Peripheral edema is swelling that is caused by a buildup of fluid. Peripheral edema most often affects the lower legs, ankles, and feet. It can also develop in the arms, hands, and face. The area of the body that has peripheral edema will look swollen. It may also feel heavy or warm. Your clothes may start to feel tight. Pressing on the area may make a temporary dent in your skin (pitting edema). You may not be able to move your swollen arm or leg as much as usual. There are many causes of peripheral edema. It can happen because of a complication of other conditions such as heart failure, kidney disease, or a problem with your circulation. It also can be a side effect of certain medicines or happen because of an infection. It often happens to women during pregnancy. Sometimes, the cause is not known. Follow these instructions at home: Managing pain, stiffness, and swelling  Raise (elevate) your legs while you are sitting or lying down. Move around often to prevent stiffness and to reduce swelling. Do not sit or stand for long periods of time. Do not wear tight clothing. Do not wear garters on your upper legs. Exercise your legs to get your circulation going. This helps to move the fluid back into your blood vessels, and it may help the swelling go down. Wear compression stockings as told by your health care provider. These stockings help to prevent blood clots and reduce swelling in your legs. It is important that these are the correct size. These stockings should be prescribed by your doctor to prevent possible injuries. If elastic bandages or wraps are recommended, use them as told by your health care provider. Medicines Take over-the-counter and prescription medicines only as told by your health care provider. Your health care provider may prescribe medicine to help your body get rid of excess water (diuretic). Take this medicine if you are told to take it. General  instructions Eat a low-salt (low-sodium) diet as told by your health care provider. Sometimes, eating less salt may reduce swelling. Pay attention to any changes in your symptoms. Moisturize your skin daily to help prevent skin from cracking and draining. Keep all follow-up visits. This is important. Contact a health care provider if: You have a fever. You have swelling in only one leg. You have increased swelling, redness, or pain in one or both of your legs. You have drainage or sores at the area where you have edema. Get help right away if: You have edema that starts suddenly or is getting worse, especially if you are pregnant or have a medical condition. You develop shortness of breath, especially when you are lying down. You have pain in your chest or abdomen. You feel weak. You feel like you will faint. These symptoms may be an emergency. Get help right away. Call 911. Do not wait to see if the symptoms will go away. Do not drive yourself to the hospital. Summary Peripheral edema is swelling that is caused by a buildup of fluid. Peripheral edema most often affects the lower legs, ankles, and feet. Move around often to prevent stiffness and to reduce swelling. Do not sit or stand for long periods of time. Pay attention to any changes in your symptoms. Contact a health care provider if you have edema that starts suddenly or is getting worse, especially if you are pregnant or have a medical condition. Get help right away if you develop shortness of breath, especially when lying down.  This information is not intended to replace advice given to you by your health care provider. Make sure you discuss any questions you have with your health care provider. Document Revised: 02/09/2021 Document Reviewed: 02/09/2021 Elsevier Patient Education  2024 [employer].

## 2024-04-26 NOTE — Progress Notes (Signed)
 Subjective:    Patient ID: Andrew Phelps, male    DOB: 1945/07/05, 78 y.o.   MRN: [id number]  Chief Complaint  Patient presents with   Medical Management of Chronic Issues   PT presents the office today for chronic follow up and pain medication refill.   PT is followed by Nephrologists every 2 months for CKD.  He has peripheral edema. He is currently taking demadex  100 mg one day and metolazone  5 mg twice a week. Swelling has dramatically improved!   He is followed by Vascular for PAD.   He is considered morbid obese with a BMI of 36 and co morbidity of HTN and CKD.  Hypertension This is a chronic problem. The current episode started more than 1 year ago. The problem has been resolved since onset. The problem is controlled. Associated symptoms include malaise/fatigue, peripheral edema and shortness of breath (when walking). Risk factors for coronary artery disease include dyslipidemia, obesity, male gender and sedentary lifestyle. The current treatment provides moderate improvement. Hypertensive end-organ damage includes kidney disease. Identifiable causes of hypertension include a thyroid  problem.  Thyroid  Problem Presents for follow-up visit. Symptoms include constipation, dry skin and fatigue. Patient reports no diarrhea. The symptoms have been stable.  Arthritis Presents for follow-up visit. He complains of pain and stiffness. Affected locations include the left knee, right knee, left MCP and right MCP. His pain is at a severity of 9/10. Associated symptoms include fatigue. Pertinent negatives include no diarrhea.  Insomnia Primary symptoms: sleep disturbance, difficulty falling asleep, malaise/fatigue.   The current episode started more than one year. The onset quality is gradual. The problem occurs intermittently. The problem has been resolved since onset. Past treatments include medication. The treatment provided moderate relief.  Hyperlipidemia This is a chronic problem.  The current episode started more than 1 year ago. The problem is controlled. Recent lipid tests were reviewed and are normal. Exacerbating diseases include obesity. Associated symptoms include shortness of breath (when walking). Current antihyperlipidemic treatment includes statins. The current treatment provides moderate improvement of lipids. Risk factors for coronary artery disease include dyslipidemia, diabetes mellitus, hypertension, male sex, a sedentary lifestyle and obesity.  Back Pain This is a chronic problem. The current episode started more than 1 year ago. The problem occurs intermittently. The problem has been waxing and waning since onset. The pain is present in the lumbar spine. The quality of the pain is described as aching. The pain is at a severity of 9/10. The pain is moderate. Risk factors include obesity. He has tried analgesics for the symptoms. The treatment provided moderate relief.  Anemia Presents for follow-up visit. Symptoms include malaise/fatigue.    Current opioids rx- Norco 5-325 mg # meds rx- 60 Effectiveness of current meds-stable Adverse reactions from pain meds-none Morphine equivalent- 10   Pill count performed-No Last drug screen - today ( high risk q41m, moderate risk q78m, low risk yearly ) Urine drug screen today- Yes Was the [facility] reviewed- yes             If yes were their any concerning findings? - none   Pain contract signed on: 10/13/23  Review of Systems  Constitutional:  Positive for fatigue and malaise/fatigue.  Respiratory:  Positive for shortness of breath (when walking).   Gastrointestinal:  Positive for constipation. Negative for diarrhea.  Musculoskeletal:  Positive for back pain and stiffness.  Psychiatric/Behavioral:  Positive for sleep disturbance.   All other systems reviewed and are negative.  Family History  Problem Relation Age of Onset   Heart disease Mother    Heart disease Father    Hypertension Brother    Hyperlipidemia  Brother    Heart disease Sister    Heart disease Brother    Heart disease Brother    Cancer Brother    Colon cancer Neg Hx    Liver disease Neg Hx    Social History   Socioeconomic History   Marital status: Divorced    Spouse name: Not on file   Number of children: 3   Years of education: 8    Highest education level: 8th grade  Occupational History   Occupation: Retired  Tobacco Use   Smoking status: Former    Current packs/day: 0.00    Average packs/day: 2.0 packs/day for 25.0 years (50.0 ttl pk-yrs)    Types: Cigarettes    Start date: 07/05/1970    Quit date: 07/06/1995    Years since quitting: 28.8   Smokeless tobacco: Current    Types: Chew  Vaping Use   Vaping status: Never Used  Substance and Sexual Activity   Alcohol use: Not Currently    Comment: stopped 3 months ago   Drug use: No   Sexual activity: Yes  Other Topics Concern   Not on file  Social History Narrative   Roommate 44 years old   Spends lots of time with brother and children   Social Drivers of Corporate Investment Banker Strain: Low Risk  (10/21/2023)   Overall Financial Resource Strain (CARDIA)    Difficulty of Paying Living Expenses: Not hard at all  Food Insecurity: No Food Insecurity (10/21/2023)   Hunger Vital Sign    Worried About Running Out of Food in the Last Year: Never true    Ran Out of Food in the Last Year: Never true  Transportation Needs: No Transportation Needs (10/21/2023)   PRAPARE - Administrator, Civil Service (Medical): No    Lack of Transportation (Non-Medical): No  Physical Activity: Sufficiently Active (10/21/2023)   Exercise Vital Sign    Days of Exercise per Week: 7 days    Minutes of Exercise per Session: 30 min  Stress: No Stress Concern Present (10/21/2023)   [employer] of Occupational Health - Occupational Stress Questionnaire    Feeling of Stress : Not at all  Social Connections: Socially Isolated (10/21/2023)   Social Connection and Isolation  Panel    Frequency of Communication with Friends and Family: More than three times a week    Frequency of Social Gatherings with Friends and Family: More than three times a week    Attends Religious Services: Never    Database Administrator or Organizations: No    Attends Banker Meetings: Never    Marital Status: Divorced       Objective:   Physical Exam Vitals reviewed.  Constitutional:      General: He is not in acute distress.    Appearance: He is well-developed. He is obese.  HENT:     Head: Normocephalic.     Right Ear: Tympanic membrane normal.     Left Ear: Tympanic membrane normal.  Eyes:     General:        Right eye: No discharge.        Left eye: No discharge.     Pupils: Pupils are equal, round, and reactive to light.  Neck:     Thyroid : No thyromegaly.  Cardiovascular:  Rate and Rhythm: Normal rate and regular rhythm.     Heart sounds: Normal heart sounds. No murmur heard. Pulmonary:     Effort: Pulmonary effort is normal. No respiratory distress.     Breath sounds: Normal breath sounds. No wheezing.  Abdominal:     General: Bowel sounds are normal. There is no distension.     Palpations: Abdomen is soft.     Tenderness: There is no abdominal tenderness.  Musculoskeletal:        General: No tenderness. Normal range of motion.     Cervical back: Normal range of motion and neck supple.     Right lower leg: Edema (3+) present.     Left lower leg: Edema (2+) present.     Comments: Bilateral legs erythemas  Skin:    General: Skin is warm and dry.     Findings: No erythema or rash.  Neurological:     Mental Status: He is alert and oriented to person, place, and time.     Cranial Nerves: No cranial nerve deficit.     Deep Tendon Reflexes: Reflexes are normal and symmetric.  Psychiatric:        Behavior: Behavior normal.        Thought Content: Thought content normal.        Judgment: Judgment normal.       BP 105/68   Pulse 87   Temp  (!) 97.3 F (36.3 C) (Temporal)   Ht 5' 11 (1.803 m)   Wt 260 lb 9.6 oz (118.2 kg)   SpO2 97%   BMI 36.35 kg/m      Assessment & Plan:   Andrew Phelps comes in today with chief complaint of Medical Management of Chronic Issues   Diagnosis and orders addressed:  1. Other polyneuropathy - gabapentin  (NEURONTIN ) 600 MG tablet; Take 1 tablet (600 mg total) by mouth 3 (three) times daily.  Dispense: 270 tablet; Refill: 2 - CMP14+EGFR - CBC with Differential/Platelet  2. Arthritis - HYDROcodone -acetaminophen  (NORCO) 5-325 MG tablet; Take 1 tablet by mouth every 12 (twelve) hours as needed for moderate pain (pain score 4-6).  Dispense: 60 tablet; Refill: 0 - HYDROcodone -acetaminophen  (NORCO) 5-325 MG tablet; Take 1 tablet by mouth every 12 (twelve) hours as needed for moderate pain (pain score 4-6).  Dispense: 60 tablet; Refill: 0 - HYDROcodone -acetaminophen  (NORCO) 5-325 MG tablet; Take 1 tablet by mouth every 12 (twelve) hours as needed for moderate pain (pain score 4-6).  Dispense: 60 tablet; Refill: 0 - CMP14+EGFR - CBC with Differential/Platelet  3. Uncomplicated opioid dependence (HCC)  - HYDROcodone -acetaminophen  (NORCO) 5-325 MG tablet; Take 1 tablet by mouth every 12 (twelve) hours as needed for moderate pain (pain score 4-6).  Dispense: 60 tablet; Refill: 0 - HYDROcodone -acetaminophen  (NORCO) 5-325 MG tablet; Take 1 tablet by mouth every 12 (twelve) hours as needed for moderate pain (pain score 4-6).  Dispense: 60 tablet; Refill: 0 - HYDROcodone -acetaminophen  (NORCO) 5-325 MG tablet; Take 1 tablet by mouth every 12 (twelve) hours as needed for moderate pain (pain score 4-6).  Dispense: 60 tablet; Refill: 0 - CMP14+EGFR - CBC with Differential/Platelet  4. Chronic bilateral low back pain without sciatica - HYDROcodone -acetaminophen  (NORCO) 5-325 MG tablet; Take 1 tablet by mouth every 12 (twelve) hours as needed for moderate pain (pain score 4-6).  Dispense: 60  tablet; Refill: 0 - HYDROcodone -acetaminophen  (NORCO) 5-325 MG tablet; Take 1 tablet by mouth every 12 (twelve) hours as needed for moderate pain (pain score 4-6).  Dispense:  60 tablet; Refill: 0 - HYDROcodone -acetaminophen  (NORCO) 5-325 MG tablet; Take 1 tablet by mouth every 12 (twelve) hours as needed for moderate pain (pain score 4-6).  Dispense: 60 tablet; Refill: 0 - CMP14+EGFR - CBC with Differential/Platelet  5. Encounter for immunization (Primary)  - Flu vaccine HIGH DOSE PF(Fluzone Trivalent)  6. Morbid obesity (HCC)  - CMP14+EGFR - CBC with Differential/Platelet  7. Chronic kidney disease, stage 4 (severe) (HCC) - CMP14+EGFR - CBC with Differential/Platelet  8. Chronic diastolic (congestive) heart failure (HCC)  - CMP14+EGFR - CBC with Differential/Platelet  9. Insomnia, unspecified type  - CMP14+EGFR - CBC with Differential/Platelet  10. Hypothyroidism, unspecified type - CMP14+EGFR - CBC with Differential/Platelet  11. Hyperlipidemia, unspecified hyperlipidemia type  - CMP14+EGFR - CBC with Differential/Platelet  12. Essential hypertension - CMP14+EGFR - CBC with Differential/Platelet    Labs pending Patient reviewed in [place] controlled database, no flags noted. Contract and drug screen are up to date.  Keep specialists  Continue current medications  Health Maintenance reviewed Diet and exercise encouraged  Follow up plan: 3 months    Bari Learn, FNP

## 2024-05-07 ENCOUNTER — Other Ambulatory Visit: Payer: Self-pay | Admitting: *Deleted

## 2024-05-27 ENCOUNTER — Other Ambulatory Visit: Payer: Self-pay | Admitting: Family

## 2024-05-27 DIAGNOSIS — E785 Hyperlipidemia, unspecified: Secondary | ICD-10-CM

## 2024-05-27 DIAGNOSIS — J45909 Unspecified asthma, uncomplicated: Secondary | ICD-10-CM

## 2024-05-27 DIAGNOSIS — N1832 Chronic kidney disease, stage 3b: Secondary | ICD-10-CM

## 2024-06-06 ENCOUNTER — Other Ambulatory Visit: Payer: Self-pay | Admitting: Family

## 2024-06-06 DIAGNOSIS — N1832 Chronic kidney disease, stage 3b: Secondary | ICD-10-CM

## 2024-07-10 ENCOUNTER — Encounter: Payer: Self-pay | Admitting: *Deleted

## 2024-07-24 ENCOUNTER — Other Ambulatory Visit: Payer: Self-pay | Admitting: Family

## 2024-07-24 DIAGNOSIS — J302 Other seasonal allergic rhinitis: Secondary | ICD-10-CM

## 2024-07-27 ENCOUNTER — Ambulatory Visit: Admitting: Family

## 2024-07-31 ENCOUNTER — Ambulatory Visit: Admitting: Family

## 2024-10-23 ENCOUNTER — Ambulatory Visit: Payer: Self-pay
